# Patient Record
Sex: Female | Born: 1960 | Race: White | Hispanic: No | Marital: Married | Smoking: Former smoker
Health system: Southern US, Community
[De-identification: ages and names within clinical notes are randomized; demographics above are authoritative.]

## PROBLEM LIST (undated history)

## (undated) DIAGNOSIS — F319 Bipolar disorder, unspecified: Secondary | ICD-10-CM

## (undated) DIAGNOSIS — J302 Other seasonal allergic rhinitis: Secondary | ICD-10-CM

## (undated) DIAGNOSIS — R7303 Prediabetes: Secondary | ICD-10-CM

## (undated) DIAGNOSIS — I639 Cerebral infarction, unspecified: Secondary | ICD-10-CM

## (undated) DIAGNOSIS — E559 Vitamin D deficiency, unspecified: Secondary | ICD-10-CM

## (undated) DIAGNOSIS — J45909 Unspecified asthma, uncomplicated: Secondary | ICD-10-CM

## (undated) HISTORY — PX: ABDOMINAL HYSTERECTOMY: SHX81

## (undated) HISTORY — PX: BLADDER SUSPENSION: SHX72

---

## 2006-01-11 ENCOUNTER — Encounter: Admission: RE | Admit: 2006-01-11 | Discharge: 2006-01-11 | Payer: Self-pay | Admitting: Family Medicine

## 2008-11-19 ENCOUNTER — Encounter: Admission: RE | Admit: 2008-11-19 | Discharge: 2008-12-21 | Payer: Self-pay | Admitting: Family Medicine

## 2009-04-30 ENCOUNTER — Ambulatory Visit: Payer: Self-pay | Admitting: Diagnostic Radiology

## 2009-04-30 ENCOUNTER — Emergency Department (HOSPITAL_BASED_OUTPATIENT_CLINIC_OR_DEPARTMENT_OTHER): Admission: EM | Admit: 2009-04-30 | Discharge: 2009-05-01 | Payer: Self-pay | Admitting: Emergency Medicine

## 2009-05-01 ENCOUNTER — Ambulatory Visit: Payer: Self-pay | Admitting: Diagnostic Radiology

## 2010-06-19 LAB — POCT CARDIAC MARKERS
CKMB, poc: <1 ng/mL — ABNORMAL LOW (ref 1.0–8.0)
CKMB, poc: <1 ng/mL — ABNORMAL LOW (ref 1.0–8.0)
Myoglobin, poc: 26.7 ng/mL (ref 12–200)
Troponin i, poc: <0.05 ng/mL (ref 0.00–0.09)

## 2010-06-19 LAB — BASIC METABOLIC PANEL
BUN: 22 mg/dL (ref 6–23)
Chloride: 105 mEq/L (ref 96–112)
Creatinine, Ser: 0.8 mg/dL (ref 0.4–1.2)
Glucose, Bld: 95 mg/dL (ref 70–99)

## 2010-06-19 LAB — CBC
MCHC: 34.3 g/dL (ref 30.0–36.0)
MCV: 93.1 fL (ref 78.0–100.0)
Platelets: 257 10*3/uL (ref 150–400)
RDW: 12.6 % (ref 11.5–15.5)
WBC: 11.9 10*3/uL — ABNORMAL HIGH (ref 4.0–10.5)

## 2010-06-19 LAB — D-DIMER, QUANTITATIVE: D-Dimer, Quant: 0.49 ug/mL-FEU — ABNORMAL HIGH (ref 0.00–0.48)

## 2012-12-09 ENCOUNTER — Encounter: Payer: BC Managed Care – PPO | Attending: Family Medicine | Admitting: Dietician

## 2012-12-09 VITALS — Ht 64.0 in | Wt 183.9 lb

## 2012-12-09 DIAGNOSIS — E669 Obesity, unspecified: Secondary | ICD-10-CM

## 2012-12-09 DIAGNOSIS — Z713 Dietary counseling and surveillance: Secondary | ICD-10-CM | POA: Insufficient documentation

## 2012-12-09 NOTE — Progress Notes (Signed)
   Medical Nutrition Therapy:  Appt start time: 0900 end time:  1000.   Assessment:  Primary concerns today: Jenna Delgado is here today for weight loss. She was diagnosed with diabetes 23 years ago and has managed it with diet. Last HgBA1C a couple of weeks ago was 5.9%, per patient. Jenna Delgado would like to lose 50 pounds in order to help continue to manage her diabetes. States she was on steroids for asthma years ago which caused weight gain that she hasn't been able to lose weight and also reports that she gained 10 pounds in the past year.   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 Jenna Delgado a lot for work including internationally. States that she shares the grocery shopping and food preparation with her husband when she is home.   MEDICATIONS: see list   DIETARY INTAKE:  Avoided foods include most vegetables  24-hr recall:  B ( AM): skip 3-4 x week, OR  eggs, toast, and bacon OR protein fiber bar water with whole milk Snk ( AM): none  L ( PM): will skip if ate breakfast, or will go out for [place] or gyros 3 x week with water Snk ( PM): cheese, usually nothing, on weekends chips and dip a couple times per month D ( PM): hamburger with cheese, steak, pork chops, with salad, corn, asparagus, tomatoes Or homemade pizza OR pasta (rarely) with water sometimes wines or beer 2 x week  Snk ( PM): none. Ice cream 2 x month Beverages: water or diet soda   Usual physical activity: goes to the gym 3-4 week usually doing strength training or classes in pool for about 1. 5 hours (cardio and strength training)   Estimated energy needs: 1800 calories 200 g carbohydrates 135 g protein 50 g fat  Progress Towards Goal(s):  In progress.   Nutritional Diagnosis:  NB-1.1 Food and nutrition-related knowledge deficit As related to history of meal skipping, frequent restaurant meals, and large portions .  As evidenced by BMI of 31.6.    Intervention:  Nutrition counseling provided.  Recommending spreading out carbs and calories throughout the day instead of having larger amounts at meal times. Discussed having snacks with protein and carbs and the importance of not skipping meals. Recommended using Calorie Brooke Dare to help track carbs in foods and monitor portion sizes using the "plate method".   Handouts given during visit include:  MyPlate Handout  16X CHO Snacks  Yellow Card for CHO Counting  Monitoring/Evaluation:  Dietary intake, exercise, and body weight in 2 month(s).

## 2012-12-09 NOTE — Patient Instructions (Addendum)
Check out CalorieKing App to check carbs in restaurant and home foods. Don't skip meals.  Aim to have 2-3 carb choices per meal (30-45g CHO).  Have snacks with carbs and protein (15g). Add as many vegetables to plate as possible (goal of 1/2 of your plate). Continue exercising at least 30 minute 5 x week.

## 2012-12-17 ENCOUNTER — Encounter (HOSPITAL_BASED_OUTPATIENT_CLINIC_OR_DEPARTMENT_OTHER): Payer: Self-pay | Admitting: Emergency Medicine

## 2012-12-17 ENCOUNTER — Emergency Department (HOSPITAL_BASED_OUTPATIENT_CLINIC_OR_DEPARTMENT_OTHER): Payer: BC Managed Care – PPO

## 2012-12-17 ENCOUNTER — Emergency Department (HOSPITAL_BASED_OUTPATIENT_CLINIC_OR_DEPARTMENT_OTHER)
Admission: EM | Admit: 2012-12-17 | Discharge: 2012-12-17 | Disposition: A | Discharge status: Home / Self Care | Payer: BC Managed Care – PPO | Attending: Emergency Medicine | Admitting: Emergency Medicine

## 2012-12-17 DIAGNOSIS — R072 Precordial pain: Secondary | ICD-10-CM | POA: Insufficient documentation

## 2012-12-17 DIAGNOSIS — R079 Chest pain, unspecified: Secondary | ICD-10-CM

## 2012-12-17 DIAGNOSIS — R0602 Shortness of breath: Secondary | ICD-10-CM | POA: Insufficient documentation

## 2012-12-17 DIAGNOSIS — J45909 Unspecified asthma, uncomplicated: Secondary | ICD-10-CM | POA: Insufficient documentation

## 2012-12-17 DIAGNOSIS — Z79899 Other long term (current) drug therapy: Secondary | ICD-10-CM | POA: Insufficient documentation

## 2012-12-17 DIAGNOSIS — R6884 Jaw pain: Secondary | ICD-10-CM | POA: Insufficient documentation

## 2012-12-17 HISTORY — DX: Other seasonal allergic rhinitis: J30.2

## 2012-12-17 HISTORY — DX: Prediabetes: R73.03

## 2012-12-17 HISTORY — DX: Unspecified asthma, uncomplicated: J45.909

## 2012-12-17 LAB — BASIC METABOLIC PANEL
CO2: 25 mEq/L (ref 19–32)
Glucose, Bld: 107 mg/dL — ABNORMAL HIGH (ref 70–99)
Potassium: 4.1 mEq/L (ref 3.5–5.1)
Sodium: 141 mEq/L (ref 135–145)

## 2012-12-17 LAB — CBC
Hemoglobin: 13.4 g/dL (ref 12.0–15.0)
Platelets: 194 10*3/uL (ref 150–400)
RBC: 4.28 MIL/uL (ref 3.87–5.11)
WBC: 5 10*3/uL (ref 4.0–10.5)

## 2012-12-17 LAB — TROPONIN I
Troponin I: <0.3 ng/mL (ref <0.30)
Troponin I: <0.3 ng/mL (ref <0.30)

## 2012-12-17 MED ORDER — OMEPRAZOLE 20 MG PO CPDR: 20.0000 mg | DELAYED_RELEASE_CAPSULE | Freq: Every day | ORAL | Status: DC

## 2012-12-17 MED ORDER — PANTOPRAZOLE SODIUM 40 MG IV SOLR
40.0000 mg | Freq: Once | INTRAVENOUS | Status: AC
  Administered 2012-12-17: 40 mg via INTRAVENOUS
  Filled 2012-12-17: qty 40

## 2012-12-17 NOTE — ED Provider Notes (Signed)
CSN: [id number]     Arrival date & time 12/17/12  1109 History   First MD Initiated Contact with Patient 12/17/12 1152     Chief Complaint  Patient presents with  . Chest Pain   (Consider location/radiation/quality/duration/timing/severity/associated sxs/prior Treatment) Patient is a 52 y.o. female presenting with chest pain. The history is provided by the patient. No language interpreter was used.  Chest Pain Pain location:  Substernal area Associated symptoms: shortness of breath   Associated symptoms: no abdominal pain, no cough, no fever, no nausea and no weakness   Associated symptoms comment:  Sharp chest pain that began while sitting up doing work on a computer. It intensified over a short period of time and spread bilaterally around to back. She also reports aching of her left jaw. She states she had a cardiac workup in [place] years ago but is only able to tell that she did not have any arterial interventions, catheterizations or procedures, but also that she has not had to see a cardiologist since. No nausea. She experienced SOB along with the pain. No recent cough or fever.   Past Medical History  Diagnosis Date  . Asthma   . Prediabetes   . Seasonal allergic rhinitis    Past Surgical History  Procedure Laterality Date  . Abdominal hysterectomy     History reviewed. No pertinent family history. History  Substance Use Topics  . Smoking status: Never Smoker   . Smokeless tobacco: Never Used  . Alcohol Use: Yes     Comment: occasionally   OB History   Grav Para Term Preterm Abortions TAB SAB Ect Mult Living                 Review of Systems  Constitutional: Negative for fever and chills.  HENT: Negative.  Negative for neck pain.        C/O jaw aching.  Respiratory: Positive for shortness of breath. Negative for cough.   Cardiovascular: Positive for chest pain.  Gastrointestinal: Negative.  Negative for nausea and abdominal pain.  Musculoskeletal: Negative.    Neurological: Negative.  Negative for weakness.    Allergies  Imitrex; Tetracyclines & related; and Theophyllines  Home Medications   Current Outpatient Rx  Name  Route  Sig  Dispense  Refill  . CALCIUM PO   Oral   Take by mouth.         . Cholecalciferol (VITAMIN D-3) 5000 UNITS TABS   Oral   Take by mouth.         . citalopram (CELEXA) 10 MG tablet   Oral   Take 10 mg by mouth daily.         [name] doxepin (SINEQUAN) 10 MG capsule   Oral   Take 10 mg by mouth.         . Multiple Vitamin (MULTIVITAMIN WITH MINERALS) TABS tablet   Oral   Take 1 tablet by mouth daily.         . pirbuterol (MAXAIR) 200 MCG/INH inhaler   Inhalation   Inhale 2 puffs into the lungs 4 (four) times daily.          BP 117/68  Pulse 69  Temp(Src) 97.8 F (36.6 C) (Oral)  Resp 18  SpO2 96% Physical Exam  Constitutional: She is oriented to person, place, and time. She appears well-developed and well-nourished. No distress.  HENT:  Head: Normocephalic.  Neck: Normal range of motion. Neck supple.  Cardiovascular: Normal rate and regular rhythm.  No murmur heard. Pulmonary/Chest: Effort normal and breath sounds normal.  Abdominal: Soft. Bowel sounds are normal. There is no tenderness. There is no rebound and no guarding.  Musculoskeletal: Normal range of motion. She exhibits no edema.  Neurological: She is alert and oriented to person, place, and time.  Skin: Skin is warm and dry. No rash noted.  Psychiatric: She has a normal mood and affect.    ED Course  Procedures (including critical care time) Labs Review Labs Reviewed  CBC  BASIC METABOLIC PANEL  TROPONIN I   Date: 12/17/2012  Rate: 63  Rhythm: normal sinus rhythm  QRS Axis: normal  Intervals: normal  ST/T Wave abnormalities: normal  Conduction Disutrbances:none  Narrative Interpretation:   Old EKG Reviewed: none available  Results for orders placed during the hospital encounter of 12/17/12  CBC      Result  Value Range   WBC 5.0  4.0 - 10.5 K/uL   RBC 4.28  3.87 - 5.11 MIL/uL   Hemoglobin 13.4  12.0 - 15.0 g/dL   HCT 91.4  78.2 - 95.6 %   MCV 93.2  78.0 - 100.0 fL   MCH 31.3  26.0 - 34.0 pg   MCHC 33.6  30.0 - 36.0 g/dL   RDW 21.3  08.6 - 57.8 %   Platelets 194  150 - 400 K/uL  BASIC METABOLIC PANEL      Result Value Range   Sodium 141  135 - 145 mEq/L   Potassium 4.1  3.5 - 5.1 mEq/L   Chloride 105  96 - 112 mEq/L   CO2 25  19 - 32 mEq/L   Glucose, Bld 107 (*) 70 - 99 mg/dL   BUN 19  6 - 23 mg/dL   Creatinine, Ser 4.69  0.50 - 1.10 mg/dL   Calcium 62.9  8.4 - 52.8 mg/dL   GFR calc non Af Amer >90  >90 mL/min   GFR calc Af Amer >90  >90 mL/min  TROPONIN I      Result Value Range   Troponin I <0.30  <0.30 ng/mL  TROPONIN I      Result Value Range   Troponin I <0.30  <0.30 ng/mL   Dg Chest Portable 1 View  12/17/2012   CLINICAL DATA:  Chest pain  EXAM: PORTABLE CHEST - 1 VIEW  COMPARISON:  08/13/2012  FINDINGS: Hypoventilation with decreased lung volumes and bibasilar atelectasis. Negative for heart failure or effusion.  IMPRESSION: Hypoventilation with bibasilar atelectasis.   Electronically Signed   By: Marlan Palau M.D.   On: 12/17/2012 12:24   Imaging Review No results found.  MDM  No diagnosis found. 1. Chest pain  Patient having atypical chest pain wrapping around to back that decreased spontaneously over time and has remained asymptomatic while here without intervention. Enzymes x 2 negative, normal sinus EKG. Stable for discharge.     Arnoldo Hooker, PA-C 12/17/12 1551

## 2012-12-17 NOTE — ED Notes (Signed)
Pain started while sitting in the computer. Also reports dizziness. Pain was generalized on the chest and radiating to the back, law and neck. Relieved with NTG given by EMS. Currently pt c/o "back pain mainly, the chest feel a bit better"

## 2012-12-18 NOTE — ED Provider Notes (Signed)
Medical screening examination/treatment/procedure(s) were performed by non-physician practitioner and as supervising physician I was immediately available for consultation/collaboration.   Gwyneth Sprout, MD 12/18/12 [id number]

## 2013-02-09 ENCOUNTER — Ambulatory Visit: Payer: BC Managed Care – PPO | Admitting: Dietician

## 2013-07-13 ENCOUNTER — Emergency Department (HOSPITAL_COMMUNITY)
Admission: EM | Admit: 2013-07-13 | Discharge: 2013-07-13 | Disposition: A | Discharge status: Home / Self Care | Payer: BC Managed Care – PPO | Attending: Emergency Medicine | Admitting: Emergency Medicine

## 2013-07-13 ENCOUNTER — Encounter (HOSPITAL_COMMUNITY): Payer: Self-pay | Admitting: Emergency Medicine

## 2013-07-13 ENCOUNTER — Emergency Department (HOSPITAL_COMMUNITY): Payer: BC Managed Care – PPO

## 2013-07-13 DIAGNOSIS — Z79899 Other long term (current) drug therapy: Secondary | ICD-10-CM | POA: Insufficient documentation

## 2013-07-13 DIAGNOSIS — H00019 Hordeolum externum unspecified eye, unspecified eyelid: Secondary | ICD-10-CM

## 2013-07-13 DIAGNOSIS — J45909 Unspecified asthma, uncomplicated: Secondary | ICD-10-CM | POA: Insufficient documentation

## 2013-07-13 DIAGNOSIS — H05019 Cellulitis of unspecified orbit: Secondary | ICD-10-CM | POA: Insufficient documentation

## 2013-07-13 DIAGNOSIS — Z792 Long term (current) use of antibiotics: Secondary | ICD-10-CM | POA: Insufficient documentation

## 2013-07-13 DIAGNOSIS — L03213 Periorbital cellulitis: Secondary | ICD-10-CM

## 2013-07-13 LAB — COMPREHENSIVE METABOLIC PANEL
ALK PHOS: 83 U/L (ref 39–117)
ALT: 28 U/L (ref 0–35)
AST: 19 U/L (ref 0–37)
Albumin: 3.9 g/dL (ref 3.5–5.2)
BILIRUBIN TOTAL: 0.7 mg/dL (ref 0.3–1.2)
BUN: 14 mg/dL (ref 6–23)
CHLORIDE: 101 meq/L (ref 96–112)
CO2: 27 meq/L (ref 19–32)
Calcium: 9.5 mg/dL (ref 8.4–10.5)
Creatinine, Ser: 0.78 mg/dL (ref 0.50–1.10)
Glucose, Bld: 188 mg/dL — ABNORMAL HIGH (ref 70–99)
POTASSIUM: 4.1 meq/L (ref 3.7–5.3)
Sodium: 141 mEq/L (ref 137–147)
Total Protein: 7.2 g/dL (ref 6.0–8.3)

## 2013-07-13 LAB — CBC WITH DIFFERENTIAL/PLATELET
Basophils Absolute: 0.1 10*3/uL (ref 0.0–0.1)
Basophils Relative: 1 % (ref 0–1)
EOS ABS: 0.4 10*3/uL (ref 0.0–0.7)
EOS PCT: 6 % — AB (ref 0–5)
HCT: 40.2 % (ref 36.0–46.0)
Hemoglobin: 13.3 g/dL (ref 12.0–15.0)
LYMPHS ABS: 1.7 10*3/uL (ref 0.7–4.0)
Lymphocytes Relative: 22 % (ref 12–46)
MCH: 30.6 pg (ref 26.0–34.0)
MCHC: 33.1 g/dL (ref 30.0–36.0)
MCV: 92.6 fL (ref 78.0–100.0)
Monocytes Absolute: 0.4 10*3/uL (ref 0.1–1.0)
Monocytes Relative: 5 % (ref 3–12)
Neutro Abs: 5.3 10*3/uL (ref 1.7–7.7)
Neutrophils Relative %: 67 % (ref 43–77)
PLATELETS: 235 10*3/uL (ref 150–400)
RBC: 4.34 MIL/uL (ref 3.87–5.11)
RDW: 13.9 % (ref 11.5–15.5)
WBC: 7.8 10*3/uL (ref 4.0–10.5)

## 2013-07-13 MED ORDER — CEPHALEXIN 500 MG PO CAPS: 500.0000 mg | ORAL_CAPSULE | Freq: Four times a day (QID) | ORAL | Status: DC

## 2013-07-13 MED ORDER — METHYLPREDNISOLONE SODIUM SUCC 125 MG IJ SOLR: 125.0000 mg | Freq: Once | INTRAMUSCULAR | Status: DC

## 2013-07-13 MED ORDER — OXYCODONE-ACETAMINOPHEN 5-325 MG PO TABS: 1.0000 | ORAL_TABLET | Freq: Four times a day (QID) | ORAL | Status: DC | PRN

## 2013-07-13 MED ORDER — DEXTROSE 5 % IV SOLN
1.0000 g | Freq: Once | INTRAVENOUS | Status: AC
  Administered 2013-07-13: 1 g via INTRAVENOUS
  Filled 2013-07-13: qty 10

## 2013-07-13 MED ORDER — IOHEXOL 300 MG/ML  SOLN
80.0000 mL | Freq: Once | INTRAMUSCULAR | Status: AC | PRN
  Administered 2013-07-13: 80 mL via INTRAVENOUS

## 2013-07-13 MED ORDER — MORPHINE SULFATE 4 MG/ML IJ SOLN
4.0000 mg | Freq: Once | INTRAMUSCULAR | Status: AC
  Administered 2013-07-13: 4 mg via INTRAVENOUS
  Filled 2013-07-13: qty 1

## 2013-07-13 NOTE — Discharge Instructions (Signed)
Facial Infection You have an infection of your face. This requires special attention to help prevent serious problems. Infections in facial wounds can cause poor healing and scars. They can also spread to deeper tissues, especially around the eye. Wound and dental infections can lead to sinusitis, infection of the eye socket, and even meningitis. Permanent damage to the skin, eye, and nervous system may result if facial infections are not treated properly. With severe infections, hospital care for IV antibiotic injections may be needed if they don't respond to oral antibiotics. Antibiotics must be taken for the full course to insure the infection is eliminated. If the infection came from a bad tooth, it may have to be extracted when the infection is under control. Warm compresses may be applied to reduce skin irritation and remove drainage. You might need a tetanus shot now if:  You cannot remember when your last tetanus shot was.  You have never had a tetanus shot.  The object that caused your wound was dirty. If you need a tetanus shot, and you decide not to get one, there is a rare chance of getting tetanus. Sickness from tetanus can be serious. If you got a tetanus shot, your arm may swell, get red and warm to the touch at the shot site. This is common and not a problem. SEEK IMMEDIATE MEDICAL CARE IF:   You have increased swelling, redness, or trouble breathing.  You have a severe headache, dizziness, nausea, or vomiting.  You develop problems with your eyesight.  You have a fever. Document Released: 04/26/2004 Document Revised: 06/11/2011 Document Reviewed: 03/19/2005 [facility] Patient Information 2014 [place], [place].  Sty A sty (hordeolum) is an infection of a gland in the eyelid located at the base of the eyelash. A sty may develop a white or yellow head of pus. It can be puffy (swollen). Usually, the sty will burst and pus will come out on its own. They do not leave lumps in the  eyelid once they drain. A sty is often confused with another form of cyst of the eyelid called a chalazion. Chalazions occur within the eyelid and not on the edge where the bases of the eyelashes are. They often are red, sore and then form firm lumps in the eyelid. CAUSES   Germs (bacteria).  Lasting (chronic) eyelid inflammation. SYMPTOMS   Tenderness, redness and swelling along the edge of the eyelid at the base of the eyelashes.  Sometimes, there is a white or yellow head of pus. It may or may not drain. DIAGNOSIS  An ophthalmologist will be able to distinguish between a sty and a chalazion and treat the condition appropriately.  TREATMENT   Styes are typically treated with warm packs (compresses) until drainage occurs.  In rare cases, medicines that kill germs (antibiotics) may be prescribed. These antibiotics may be in the form of drops, cream or pills.  If a hard lump has formed, it is generally necessary to do a small incision and remove the hardened contents of the cyst in a minor surgical procedure done in the office.  In suspicious cases, your caregiver may send the contents of the cyst to the lab to be certain that it is not a rare, but dangerous form of cancer of the glands of the eyelid. HOME CARE INSTRUCTIONS   Wash your hands often and dry them with a clean towel. Avoid touching your eyelid. This may spread the infection to other parts of the eye.  Apply heat to your eyelid for  10 to 20 minutes, several times a day, to ease pain and help to heal it faster.  Do not squeeze the sty. Allow it to drain on its own. Wash your eyelid carefully 3 to 4 times per day to remove any pus. SEEK IMMEDIATE MEDICAL CARE IF:   Your eye becomes painful or puffy (swollen).  Your vision changes.  Your sty does not drain by itself within 3 days.  Your sty comes back within a short period of time, even with treatment.  You have redness (inflammation) around the eye.  You have a  fever. Document Released: 12/27/2004 Document Revised: 06/11/2011 Document Reviewed: 08/31/2008 [facility] Patient Information 2014 [place], [place]LL[place].

## 2013-07-13 NOTE — ED Notes (Signed)
Unable to obtain blood from patient at this time.Patient did not answer when called for lab draw.RN made aware

## 2013-07-13 NOTE — ED Provider Notes (Signed)
CSN: [id number]     Arrival date & time 07/13/13  1337 History   First MD Initiated Contact with Patient 07/13/13 1659     Chief Complaint  Patient presents with  . Infection under eye   . from pcp      (Consider location/radiation/quality/duration/timing/severity/associated sxs/prior Treatment) The history is provided by the patient.   patient's had left eye pain and swelling for the last 10 days. She saw her ophthalmologist today and was sent in to rule out post-septal cellulitis. She has a pain with light of the pain with movement of the eye. There is swelling below the left eye. She states she's been having some chills. No nausea vomiting. No headache.  Past Medical History  Diagnosis Date  . Asthma   . Prediabetes   . Seasonal allergic rhinitis    Past Surgical History  Procedure Laterality Date  . Abdominal hysterectomy     History reviewed. No pertinent family history. History  Substance Use Topics  . Smoking status: Never Smoker   . Smokeless tobacco: Never Used  . Alcohol Use: Yes     Comment: occasionally   OB History   Grav Para Term Preterm Abortions TAB SAB Ect Mult Living                 Review of Systems  Constitutional: Positive for chills. Negative for appetite change.  HENT: Negative for ear discharge, ear pain and tinnitus.   Eyes: Positive for photophobia and pain. Negative for discharge and redness.  Respiratory: Negative for shortness of breath.   Cardiovascular: Negative for chest pain.  Musculoskeletal: Negative for joint swelling.  Skin: Positive for color change.      Allergies  Imitrex; Tetracyclines & related; and Theophyllines  Home Medications   Current Outpatient Rx  Name  Route  Sig  Dispense  Refill  . citalopram (CELEXA) 10 MG tablet   Oral   Take 10 mg by mouth daily.         . Cranberry 300 MG tablet   Oral   Take 300 mg by mouth 2 (two) times daily.         . fexofenadine (ALLEGRA) 180 MG tablet   Oral   Take  180 mg by mouth daily.         [name]. ibuprofen (ADVIL,MOTRIN) 200 MG tablet   Oral   Take 600 mg by mouth every 6 (six) hours as needed for moderate pain.         [name]. lamoTRIgine (LAMICTAL) 25 MG tablet   Oral   Take 37.5 mg by mouth daily.         . cephALEXin (KEFLEX) 500 MG capsule   Oral   Take 1 capsule (500 mg total) by mouth 4 (four) times daily.   20 capsule   0   . oxyCODONE-acetaminophen (PERCOCET/ROXICET) 5-325 MG per tablet   Oral   Take 1-2 tablets by mouth every 6 (six) hours as needed for severe pain.   10 tablet   0    BP 130/78  Pulse 70  Temp(Src) 98 F (36.7 C) (Oral)  Resp 18  SpO2 98% Physical Exam  Constitutional: She appears well-developed and well-nourished.  Eyes: Pupils are equal, round, and reactive to light.  Extraocular movements are intact, however there is some pain with movement of the left eye. No proptosis. There is swelling over the medial left lower eyelid with redness. Is no fluctuance there is some edema below it on the  face.  Neck: Neck supple.  Cardiovascular: Normal rate and regular rhythm.   Pulmonary/Chest: Effort normal and breath sounds normal.  Abdominal: Soft.  Musculoskeletal: She exhibits no edema.  Neurological: She is alert.    ED Course  Procedures (including critical care time) Labs Review Labs Reviewed  CBC WITH DIFFERENTIAL - Abnormal; Notable for the following:    Eosinophils Relative 6 (*)    All other components within normal limits  COMPREHENSIVE METABOLIC PANEL - Abnormal; Notable for the following:    Glucose, Bld 188 (*)    All other components within normal limits   Imaging Review Ct Orbits W/cm  07/13/2013   CLINICAL DATA:  Evaluate left orbital cellulitis  EXAM: CT ORBITS WITH CONTRAST  TECHNIQUE: Multidetector CT imaging of the orbits was performed following the bolus administration of intravenous contrast.  CONTRAST:  80mL OMNIPAQUE IOHEXOL 300 MG/ML  SOLN  COMPARISON:  None.  FINDINGS: Preseptal  soft tissue swelling overlying the inferior left orbit.  Suspected stye medially along the inferior left eyelid (series 3/image 26).  Underlying globe and retroconal soft tissues are within normal limits.  Right orbit is within normal limits.  Visualized brain parenchyma is unremarkable.  Partial opacification of the bilateral ethmoid and maxillary sinuses. Bilateral mastoid air cells are clear.  IMPRESSION: Left preseptal orbital cellulitis, as above.  Suspected stye medially along the inferior left eyelid.  Paranasal sinus disease, as above.   Electronically Signed   By: Charline BillsSriyesh  Krishnan M.D.   On: 07/13/2013 18:27     EKG Interpretation None      MDM   Final diagnoses:  Periorbital cellulitis of left eye  Stye    Patient with    left periorbital cellulitis with his diet. not amenable to draining. We will give 1 dose of IV antibiotics and follow with oral and bodies. Patient will follow with her ophthalmologist.   Juliet RudeNathan R. Rubin PayorPickering, MD 07/13/13 1935

## 2013-07-13 NOTE — ED Notes (Signed)
Patient states that she developed swelling under left eye that started several days ago.  Went to her opthomologist this morning who sent her here for an evaluation.

## 2013-08-04 ENCOUNTER — Emergency Department (HOSPITAL_BASED_OUTPATIENT_CLINIC_OR_DEPARTMENT_OTHER)
Admission: EM | Admit: 2013-08-04 | Discharge: 2013-08-04 | Disposition: A | Discharge status: Home / Self Care | Payer: BC Managed Care – PPO

## 2013-08-04 ENCOUNTER — Ambulatory Visit (HOSPITAL_BASED_OUTPATIENT_CLINIC_OR_DEPARTMENT_OTHER)
Admission: RE | Admit: 2013-08-04 | Discharge: 2013-08-04 | Disposition: A | Discharge status: Home / Self Care | Payer: BC Managed Care – PPO | Source: Ambulatory Visit | Attending: Ophthalmology | Admitting: Ophthalmology

## 2013-08-04 ENCOUNTER — Other Ambulatory Visit (HOSPITAL_BASED_OUTPATIENT_CLINIC_OR_DEPARTMENT_OTHER): Payer: Self-pay | Admitting: Ophthalmology

## 2013-08-04 DIAGNOSIS — H5710 Ocular pain, unspecified eye: Secondary | ICD-10-CM | POA: Insufficient documentation

## 2013-08-04 DIAGNOSIS — H00039 Abscess of eyelid unspecified eye, unspecified eyelid: Secondary | ICD-10-CM

## 2013-08-04 MED ORDER — GADOBENATE DIMEGLUMINE 529 MG/ML IV SOLN
15.0000 mL | Freq: Once | INTRAVENOUS | Status: AC | PRN
  Administered 2013-08-04: 15 mL via INTRAVENOUS

## 2013-08-04 NOTE — ED Notes (Signed)
Pt was not seen in the ED today. Pt not triaged. Registered in error.

## 2014-04-24 ENCOUNTER — Encounter (HOSPITAL_BASED_OUTPATIENT_CLINIC_OR_DEPARTMENT_OTHER): Payer: Self-pay | Admitting: *Deleted

## 2014-04-24 ENCOUNTER — Emergency Department (HOSPITAL_BASED_OUTPATIENT_CLINIC_OR_DEPARTMENT_OTHER): Payer: BLUE CROSS/BLUE SHIELD

## 2014-04-24 ENCOUNTER — Emergency Department (HOSPITAL_BASED_OUTPATIENT_CLINIC_OR_DEPARTMENT_OTHER)
Admission: EM | Admit: 2014-04-24 | Discharge: 2014-04-24 | Disposition: A | Discharge status: Home / Self Care | Payer: BLUE CROSS/BLUE SHIELD | Attending: Emergency Medicine | Admitting: Emergency Medicine

## 2014-04-24 DIAGNOSIS — Z792 Long term (current) use of antibiotics: Secondary | ICD-10-CM | POA: Insufficient documentation

## 2014-04-24 DIAGNOSIS — J45909 Unspecified asthma, uncomplicated: Secondary | ICD-10-CM | POA: Diagnosis not present

## 2014-04-24 DIAGNOSIS — R200 Anesthesia of skin: Secondary | ICD-10-CM | POA: Diagnosis present

## 2014-04-24 DIAGNOSIS — Z79899 Other long term (current) drug therapy: Secondary | ICD-10-CM | POA: Diagnosis not present

## 2014-04-24 LAB — URINALYSIS, ROUTINE W REFLEX MICROSCOPIC
Bilirubin Urine: NEGATIVE
Glucose, UA: NEGATIVE mg/dL
Hgb urine dipstick: NEGATIVE
Ketones, ur: NEGATIVE mg/dL
Leukocytes, UA: NEGATIVE
NITRITE: NEGATIVE
PROTEIN: NEGATIVE mg/dL
SPECIFIC GRAVITY, URINE: 1.019 (ref 1.005–1.030)
Urobilinogen, UA: 0.2 mg/dL (ref 0.0–1.0)
pH: 5 (ref 5.0–8.0)

## 2014-04-24 LAB — CBC WITH DIFFERENTIAL/PLATELET
BASOS ABS: 0.1 10*3/uL (ref 0.0–0.1)
Basophils Relative: 1 % (ref 0–1)
EOS ABS: 0.4 10*3/uL (ref 0.0–0.7)
Eosinophils Relative: 7 % — ABNORMAL HIGH (ref 0–5)
HCT: 41.9 % (ref 36.0–46.0)
HEMOGLOBIN: 13.9 g/dL (ref 12.0–15.0)
LYMPHS ABS: 1.8 10*3/uL (ref 0.7–4.0)
Lymphocytes Relative: 27 % (ref 12–46)
MCH: 30.9 pg (ref 26.0–34.0)
MCHC: 33.2 g/dL (ref 30.0–36.0)
MCV: 93.1 fL (ref 78.0–100.0)
MONO ABS: 0.4 10*3/uL (ref 0.1–1.0)
Monocytes Relative: 6 % (ref 3–12)
Neutro Abs: 3.9 10*3/uL (ref 1.7–7.7)
Neutrophils Relative %: 59 % (ref 43–77)
Platelets: 227 10*3/uL (ref 150–400)
RBC: 4.5 MIL/uL (ref 3.87–5.11)
RDW: 14 % (ref 11.5–15.5)
WBC: 6.7 10*3/uL (ref 4.0–10.5)

## 2014-04-24 LAB — BASIC METABOLIC PANEL
Anion gap: 4 — ABNORMAL LOW (ref 5–15)
BUN: 20 mg/dL (ref 6–23)
CO2: 25 mmol/L (ref 19–32)
Calcium: 9 mg/dL (ref 8.4–10.5)
Chloride: 108 mmol/L (ref 96–112)
Creatinine, Ser: 0.76 mg/dL (ref 0.50–1.10)
GFR calc Af Amer: >90 mL/min (ref >90)
Glucose, Bld: 147 mg/dL — ABNORMAL HIGH (ref 70–99)
POTASSIUM: 3.9 mmol/L (ref 3.5–5.1)
Sodium: 137 mmol/L (ref 135–145)

## 2014-04-24 MED ORDER — SODIUM CHLORIDE 0.9 % IV BOLUS (SEPSIS)
500.0000 mL | Freq: Once | INTRAVENOUS | Status: AC
  Administered 2014-04-24: 500 mL via INTRAVENOUS

## 2014-04-24 NOTE — Discharge Instructions (Signed)
Start taking a full-strength 325 mg aspirin once daily until you follow up with your primary doctor.  [facility] if your symptoms substantially worsen or change.   Paresthesia Paresthesia is an abnormal burning or prickling sensation. This sensation is generally felt in the hands, arms, legs, or feet. However, it may occur in any part of the body. It is usually not painful. The feeling may be described as:  Tingling or numbness.  "Pins and needles."  Skin crawling.  Buzzing.  Limbs "falling asleep."  Itching. Most people experience temporary (transient) paresthesia at some time in their lives. CAUSES  Paresthesia may occur when you breathe too quickly (hyperventilation). It can also occur without any apparent cause. Commonly, paresthesia occurs when pressure is placed on a nerve. The feeling quickly goes away once the pressure is removed. For some people, however, paresthesia is a long-lasting (chronic) condition caused by an underlying disorder. The underlying disorder may be:  A traumatic, direct injury to nerves. Examples include a:  Broken (fractured) neck.  Fractured skull.  A disorder affecting the brain and spinal cord (central nervous system). Examples include:  Transverse myelitis.  Encephalitis.  Transient ischemic attack.  Multiple sclerosis.  Stroke.  Tumor or blood vessel problems, such as an arteriovenous malformation pressing against the brain or spinal cord.  A condition that damages the peripheral nerves (peripheral neuropathy). Peripheral nerves are not part of the brain and spinal cord. These conditions include:  Diabetes.  Peripheral vascular disease.  Nerve entrapment syndromes, such as carpal tunnel syndrome.  Shingles.  Hypothyroidism.  Vitamin B12 deficiencies.  Alcoholism.  Heavy metal poisoning (lead, arsenic).  Rheumatoid arthritis.  Systemic lupus erythematosus. DIAGNOSIS  Your caregiver will  attempt to find the underlying cause of your paresthesia. Your caregiver may:  Take your medical history.  Perform a physical exam.  Order various lab tests.  Order imaging tests. TREATMENT  Treatment for paresthesia depends on the underlying cause. HOME CARE INSTRUCTIONS  Avoid drinking alcohol.  You may consider massage or acupuncture to help relieve your symptoms.  Keep all follow-up appointments as directed by your caregiver. SEEK IMMEDIATE MEDICAL CARE IF:   You feel weak.  You have trouble walking or moving.  You have problems with speech or vision.  You feel confused.  You cannot control your bladder or bowel movements.  You feel numbness after an injury.  You faint.  Your burning or prickling feeling gets worse when walking.  You have pain, cramps, or dizziness.  You develop a rash. MAKE SURE YOU:  Understand these instructions.  Will watch your condition.  Will get help right away if you are not doing well or get worse. Document Released: 03/09/2002 Document Revised: 06/11/2011 Document Reviewed: 12/08/2010 [facility] Patient Information 2015 [place], [place]. This information is not intended to replace advice given to you by your health care provider. Make sure you discuss any questions you have with your health care provider.

## 2014-04-24 NOTE — ED Notes (Signed)
MD at bedside. 

## 2014-04-24 NOTE — ED Provider Notes (Signed)
CSN: [id number]     Arrival date & time 04/24/14  1349 History   First MD Initiated Contact with Patient 04/24/14 1401     Chief Complaint  Patient presents with  . Numbness     (Consider location/radiation/quality/duration/timing/severity/associated sxs/prior Treatment) HPI 54 y.o. Female with lkn last night 11-1 am.  She went to bed at 11 and to sleep at 1-2.  Awoke this am at 11 and felt face was numb and thinks some asymmetry.  No change in vision, arm/leg weakness, difficulty walking, or speaking.  No similar symptoms in past.   Past Medical History  Diagnosis Date  . Asthma   . Prediabetes   . Seasonal allergic rhinitis    Past Surgical History  Procedure Laterality Date  . Abdominal hysterectomy     No family history on file. History  Substance Use Topics  . Smoking status: Never Smoker   . Smokeless tobacco: Never Used  . Alcohol Use: Yes     Comment: occasionally   OB History    No data available     Review of Systems  All other systems reviewed and are negative.     Allergies  Imitrex; Tetracyclines & related; and Theophyllines  Home Medications   Prior to Admission medications   Medication Sig Start Date End Date Taking? Authorizing Provider  cephALEXin (KEFLEX) 500 MG capsule Take 1 capsule (500 mg total) by mouth 4 (four) times daily. 07/13/13   Juliet Rude. Pickering, MD  citalopram (CELEXA) 10 MG tablet Take 10 mg by mouth daily.    Historical Provider, MD  Cranberry 300 MG tablet Take 300 mg by mouth 2 (two) times daily.    Historical Provider, MD  fexofenadine (ALLEGRA) 180 MG tablet Take 180 mg by mouth daily.    Historical Provider, MD  ibuprofen (ADVIL,MOTRIN) 200 MG tablet Take 600 mg by mouth every 6 (six) hours as needed for moderate pain.    Historical Provider, MD  lamoTRIgine (LAMICTAL) 25 MG tablet Take 37.5 mg by mouth daily.    Historical Provider, MD  oxyCODONE-acetaminophen (PERCOCET/ROXICET) 5-325 MG per tablet Take 1-2 tablets by mouth  every 6 (six) hours as needed for severe pain. 07/13/13   Juliet Rude. Pickering, MD   BP 116/76 mmHg  Pulse 95  Temp(Src) 98.8 F (37.1 C) (Oral)  Resp 18  Ht  (1.626 m)  Wt 172 lb (78.019 kg)  BMI 29.51 kg/m2  SpO2 98% Physical Exam  Constitutional: She is oriented to person, place, and time. She appears well-developed and well-nourished.  HENT:  Head: Normocephalic and atraumatic.  Right Ear: Tympanic membrane and external ear normal.  Left Ear: Tympanic membrane and external ear normal.  Nose: Nose normal. Right sinus exhibits no maxillary sinus tenderness and no frontal sinus tenderness. Left sinus exhibits no maxillary sinus tenderness and no frontal sinus tenderness.  Eyes: Conjunctivae and EOM are normal. Pupils are equal, round, and reactive to light. Right eye exhibits no nystagmus. Left eye exhibits no nystagmus.  Neck: Normal range of motion. Neck supple.  Cardiovascular: Normal rate, regular rhythm, normal heart sounds and intact distal pulses.   Pulmonary/Chest: Effort normal and breath sounds normal. No respiratory distress. She exhibits no tenderness.  Abdominal: Soft. Bowel sounds are normal. She exhibits no distension and no mass. There is no tenderness.  Musculoskeletal: Normal range of motion. She exhibits no edema or tenderness.  Neurological: She is alert and oriented to person, place, and time. She has normal strength and normal  reflexes. She displays normal reflexes. No sensory deficit. She exhibits normal muscle tone. She displays a negative Romberg sign. GCS eye subscore is 4. GCS verbal subscore is 5. GCS motor subscore is 6.  Reflex Scores:      Tricep reflexes are 2+ on the right side and 2+ on the left side.      Bicep reflexes are 2+ on the right side and 2+ on the left side.      Brachioradialis reflexes are 2+ on the right side and 2+ on the left side.      Patellar reflexes are 2+ on the right side and 2+ on the left side.      Achilles reflexes are 2+  on the right side and 2+ on the left side. Patient with normal gait without ataxia, shuffling, spasm, or antalgia. Speech is normal without dysarthria, dysphasia, or aphasia. Muscle strength is 5/5 in bilateral shoulders, elbow flexor and extensors, wrist flexor and extensors, and intrinsic hand muscles. 5/5 bilateral lower extremity hip flexors, extensors, knee flexors and extensors, and ankle dorsi and plantar flexors.  Some decreased light/dull sensatin to right side of face.    Skin: Skin is warm and dry. No rash noted.  Psychiatric: She has a normal mood and affect. Her behavior is normal. Judgment and thought content normal.  Nursing note and vitals reviewed.   ED Course  Procedures (including critical care time) Labs Review Labs Reviewed  CBC WITH DIFFERENTIAL/PLATELET  BASIC METABOLIC PANEL  URINALYSIS, ROUTINE W REFLEX MICROSCOPIC    Imaging Review No results found.   EKG Interpretation   Date/Time:  Saturday April 24 2014 14:28:08 EST Ventricular Rate:  74 PR Interval:  130 QRS Duration: 84 QT Interval:  402 QTC Calculation: 446 R Axis:   15 Text Interpretation:  Normal sinus rhythm Cannot rule out Anterior infarct  , age undetermined Abnormal ECG Confirmed by Merelyn Klump MD, Duwayne HeckANIELLE [phone]([id number]) on  04/24/2014 2:30:14 PM      MDM   Final diagnoses:  Numbness    NIH stroke scale- 1  Patient to have mri done and may be discharged if normal.  Discussed with Dr. Judd Lienelo and he will follow.   Hilario Quarryanielle S Sakai Wolford, MD 04/27/14 [phone]

## 2014-04-24 NOTE — ED Notes (Signed)
Pt reports that her face "feels weird, numb".  Pt is alert and oriented, no focal weakness, no facial droop noted in triage, speech clear.  Pt is ambulatory. She states that she had this when she woke up this am and LSN was before she went to sleep at 2am.

## 2014-04-24 NOTE — ED Provider Notes (Signed)
Care assumed from Dr. Rosalia Hammersay at shift change. Patient presents with complaints of right facial, right arm, and right leg numbness which started this morning when she woke from sleep. She was to bed feeling well. She denies any weakness. She denies any headache. Her care was signed out to me awaiting the results of a CT scan and laboratory studies. The head CT revealed no evidence for stroke and laboratory studies are essentially unremarkable.  She was reexamined and appears to be neurologically intact. I can appreciate no strength, motor, or sensory deficits. It is my assessment that this patient should undergo an MRI to fully rule out a CVA. I advised her of these recommendations, however she is reluctant to have this performed. She states that in the past her insurance company did not cover an MRI and it cost her a significant quantity of money out of pocket. She is requesting to go home and follow-up with her primary doctor. I have advised her that I do not feel as though this is the best way to go about this, however she insists upon being discharged. She understands there is the possibility she may experience a worse event I could leave her with serious disability and possibly death and is willing to assume these risks. She will follow-up with her primary doctor on Monday to discuss her situation.  Jenna Lyonsouglas Jannis Atkins, MD 04/24/14 2148

## 2015-09-25 ENCOUNTER — Encounter (HOSPITAL_COMMUNITY): Payer: Self-pay

## 2015-09-25 ENCOUNTER — Emergency Department (HOSPITAL_COMMUNITY): Payer: BLUE CROSS/BLUE SHIELD

## 2015-09-25 ENCOUNTER — Inpatient Hospital Stay (HOSPITAL_COMMUNITY): Payer: BLUE CROSS/BLUE SHIELD

## 2015-09-25 ENCOUNTER — Inpatient Hospital Stay (HOSPITAL_COMMUNITY)
Admission: EM | Admit: 2015-09-25 | Discharge: 2015-09-27 | DRG: 066 | Disposition: A | Discharge status: Home / Self Care | Payer: BLUE CROSS/BLUE SHIELD | Attending: Internal Medicine | Admitting: Internal Medicine

## 2015-09-25 ENCOUNTER — Other Ambulatory Visit: Payer: Self-pay

## 2015-09-25 DIAGNOSIS — Z6830 Body mass index (BMI) 30.0-30.9, adult: Secondary | ICD-10-CM | POA: Diagnosis not present

## 2015-09-25 DIAGNOSIS — R739 Hyperglycemia, unspecified: Secondary | ICD-10-CM | POA: Diagnosis not present

## 2015-09-25 DIAGNOSIS — R200 Anesthesia of skin: Secondary | ICD-10-CM | POA: Diagnosis present

## 2015-09-25 DIAGNOSIS — J45909 Unspecified asthma, uncomplicated: Secondary | ICD-10-CM | POA: Diagnosis present

## 2015-09-25 DIAGNOSIS — R7303 Prediabetes: Secondary | ICD-10-CM | POA: Diagnosis present

## 2015-09-25 DIAGNOSIS — E669 Obesity, unspecified: Secondary | ICD-10-CM | POA: Diagnosis present

## 2015-09-25 DIAGNOSIS — Z66 Do not resuscitate: Secondary | ICD-10-CM | POA: Diagnosis present

## 2015-09-25 DIAGNOSIS — I6789 Other cerebrovascular disease: Secondary | ICD-10-CM | POA: Diagnosis not present

## 2015-09-25 DIAGNOSIS — E785 Hyperlipidemia, unspecified: Secondary | ICD-10-CM | POA: Diagnosis present

## 2015-09-25 DIAGNOSIS — I6340 Cerebral infarction due to embolism of unspecified cerebral artery: Principal | ICD-10-CM | POA: Diagnosis present

## 2015-09-25 DIAGNOSIS — I639 Cerebral infarction, unspecified: Secondary | ICD-10-CM | POA: Insufficient documentation

## 2015-09-25 DIAGNOSIS — I63411 Cerebral infarction due to embolism of right middle cerebral artery: Secondary | ICD-10-CM | POA: Diagnosis not present

## 2015-09-25 DIAGNOSIS — Z79899 Other long term (current) drug therapy: Secondary | ICD-10-CM

## 2015-09-25 DIAGNOSIS — R7302 Impaired glucose tolerance (oral): Secondary | ICD-10-CM | POA: Diagnosis present

## 2015-09-25 DIAGNOSIS — E782 Mixed hyperlipidemia: Secondary | ICD-10-CM

## 2015-09-25 DIAGNOSIS — F419 Anxiety disorder, unspecified: Secondary | ICD-10-CM | POA: Diagnosis present

## 2015-09-25 DIAGNOSIS — F319 Bipolar disorder, unspecified: Secondary | ICD-10-CM | POA: Diagnosis present

## 2015-09-25 DIAGNOSIS — I63031 Cerebral infarction due to thrombosis of right carotid artery: Secondary | ICD-10-CM | POA: Diagnosis not present

## 2015-09-25 HISTORY — DX: Bipolar disorder, unspecified: F31.9

## 2015-09-25 LAB — DIFFERENTIAL
BASOS ABS: 0.1 10*3/uL (ref 0.0–0.1)
BASOS PCT: 1 %
Eosinophils Absolute: 0.3 10*3/uL (ref 0.0–0.7)
Eosinophils Relative: 5 %
LYMPHS ABS: 1.9 10*3/uL (ref 0.7–4.0)
Lymphocytes Relative: 35 %
MONOS PCT: 9 %
Monocytes Absolute: 0.5 10*3/uL (ref 0.1–1.0)
NEUTROS ABS: 2.7 10*3/uL (ref 1.7–7.7)
Neutrophils Relative %: 50 %

## 2015-09-25 LAB — CBC
HEMATOCRIT: 40.2 % (ref 36.0–46.0)
HEMOGLOBIN: 13.1 g/dL (ref 12.0–15.0)
MCH: 30 pg (ref 26.0–34.0)
MCHC: 32.6 g/dL (ref 30.0–36.0)
MCV: 92 fL (ref 78.0–100.0)
Platelets: 197 10*3/uL (ref 150–400)
RBC: 4.37 MIL/uL (ref 3.87–5.11)
RDW: 13.9 % (ref 11.5–15.5)
WBC: 5.5 10*3/uL (ref 4.0–10.5)

## 2015-09-25 LAB — COMPREHENSIVE METABOLIC PANEL
ALBUMIN: 4 g/dL (ref 3.5–5.0)
ALT: 47 U/L (ref 14–54)
AST: 29 U/L (ref 15–41)
Alkaline Phosphatase: 64 U/L (ref 38–126)
Anion gap: 7 (ref 5–15)
BILIRUBIN TOTAL: 0.8 mg/dL (ref 0.3–1.2)
BUN: 14 mg/dL (ref 6–20)
CHLORIDE: 108 mmol/L (ref 101–111)
CO2: 25 mmol/L (ref 22–32)
CREATININE: 0.79 mg/dL (ref 0.44–1.00)
Calcium: 9.6 mg/dL (ref 8.9–10.3)
GFR calc Af Amer: >60 mL/min (ref >60)
GLUCOSE: 110 mg/dL — AB (ref 65–99)
Potassium: 4.3 mmol/L (ref 3.5–5.1)
Sodium: 140 mmol/L (ref 135–145)
Total Protein: 7.1 g/dL (ref 6.5–8.1)

## 2015-09-25 LAB — ANTITHROMBIN III: ANTITHROMB III FUNC: 99 % (ref 75–120)

## 2015-09-25 LAB — I-STAT TROPONIN, ED: Troponin i, poc: 0 ng/mL (ref 0.00–0.08)

## 2015-09-25 LAB — CBG MONITORING, ED: Glucose-Capillary: 98 mg/dL (ref 65–99)

## 2015-09-25 MED ORDER — ASPIRIN 300 MG RE SUPP: 300.0000 mg | Freq: Every day | RECTAL | Status: DC

## 2015-09-25 MED ORDER — ASPIRIN 81 MG PO CHEW
324.0000 mg | CHEWABLE_TABLET | Freq: Once | ORAL | Status: AC
  Administered 2015-09-25: 324 mg via ORAL
  Filled 2015-09-25: qty 4

## 2015-09-25 MED ORDER — ASPIRIN 325 MG PO TABS
325.0000 mg | ORAL_TABLET | Freq: Every day | ORAL | Status: DC
  Administered 2015-09-26 – 2015-09-27 (×2): 325 mg via ORAL
  Filled 2015-09-25 (×2): qty 1

## 2015-09-25 MED ORDER — LORAZEPAM 1 MG PO TABS
2.0000 mg | ORAL_TABLET | Freq: Once | ORAL | Status: AC
  Administered 2015-09-25: 2 mg via ORAL
  Filled 2015-09-25: qty 2

## 2015-09-25 MED ORDER — ACETAMINOPHEN 325 MG PO TABS
650.0000 mg | ORAL_TABLET | Freq: Four times a day (QID) | ORAL | Status: DC | PRN
  Administered 2015-09-25 – 2015-09-27 (×4): 650 mg via ORAL
  Filled 2015-09-25 (×4): qty 2

## 2015-09-25 MED ORDER — SODIUM CHLORIDE 0.9 % IV SOLN
INTRAVENOUS | Status: AC
  Administered 2015-09-26: via INTRAVENOUS

## 2015-09-25 MED ORDER — LORATADINE 10 MG PO TABS
10.0000 mg | ORAL_TABLET | Freq: Every day | ORAL | Status: DC
  Administered 2015-09-26: 10 mg via ORAL
  Filled 2015-09-25 (×2): qty 1

## 2015-09-25 MED ORDER — ATORVASTATIN CALCIUM 80 MG PO TABS
80.0000 mg | ORAL_TABLET | Freq: Every day | ORAL | Status: DC
  Administered 2015-09-26: 80 mg via ORAL
  Filled 2015-09-25: qty 1

## 2015-09-25 MED ORDER — CITALOPRAM HYDROBROMIDE 10 MG PO TABS
10.0000 mg | ORAL_TABLET | Freq: Every day | ORAL | Status: DC
  Administered 2015-09-26: 10 mg via ORAL
  Filled 2015-09-25 (×2): qty 1

## 2015-09-25 MED ORDER — STROKE: EARLY STAGES OF RECOVERY BOOK
Freq: Once | Status: AC
  Administered 2015-09-25: 23:00:00
  Filled 2015-09-25: qty 1

## 2015-09-25 NOTE — H&P (Signed)
TRH H&P   Patient Demographics:    Jenna Delgado, is a 55 y.o. female  MRN: [id number]   DOB - Oct 14, 1960  Admit Date - 09/25/2015  Outpatient Primary MD for the patient is Gaye AlkenBARNES,ELIZABETH STEWART, MD  Referring MD/NP/PA: Raeford RazorStephen Kohut  Outpatient Specialists:     Patient coming from: home  Chief Complaint  Patient presents with  . left side numbness       HPI:    Jenna RistKaren Klas  is a 55 y.o. female, w Asthma, Glucose intolerance who presented with c/o L sided numbness.  Left hand weakness as well. Started sometime yesterday nite.  Pt woke up this am and it was still there and therefore came to the ER for evaluation.   In ED MRI brain showed posterior right frontal lobe CVA.,  Pt has mild elevation in bs =110.   Pt will be admitted for evaluation of CVA.     Review of systems:    In addition to the HPI above,  No Fever-chills, Slight Headache, No changes with Vision or hearing, No problems swallowing food or Liquids, No Chest pain, Cough or Shortness of Breath, No Abdominal pain, No Nausea or Vommitting, Bowel movements are regular, No Blood in stool or Urine, No dysuria, No new skin rashes or bruises, No new joints pains-aches,  No recent weight gain or loss, No polyuria, polydypsia or polyphagia, No significant Mental Stressors.  A full 10 point Review of Systems was done, except as stated above, all other Review of Systems were negative.   With Past History of the following :    Past Medical History  Diagnosis Date  . Asthma   . Prediabetes   . Seasonal allergic rhinitis   . Mild bipolar disorder [facility](HCC)       Past Surgical History  Procedure Laterality Date  . Abdominal hysterectomy    . Bladder suspension        Social History:     Social History  Substance Use Topics  . Smoking status: Never Smoker   . Smokeless tobacco: Never Used  .  Alcohol Use: 0.6 oz/week    1 Standard drinks or equivalent per week     Comment: occasionally     Lives -  At home  Mobility - typically ambulates without assitance.    Family History :     Family History  Problem Relation Age of Onset  . Adopted: Yes   Adopted   Home Medications:   Prior to Admission medications   Medication Sig Start Date End Date Taking? Authorizing Provider  cephALEXin (KEFLEX) 500 MG capsule Take 1 capsule (500 mg total) by mouth 4 (four) times daily. 07/13/13   Benjiman CoreNathan Pickering, MD  citalopram (CELEXA) 10 MG tablet Take 10 mg by mouth daily.    Historical Provider, MD  Cranberry 300 MG tablet Take 300 mg by mouth 2 (two)  times daily.    Historical Provider, MD  fexofenadine (ALLEGRA) 180 MG tablet Take 180 mg by mouth daily.    Historical Provider, MD  ibuprofen (ADVIL,MOTRIN) 200 MG tablet Take 600 mg by mouth every 6 (six) hours as needed for moderate pain.    Historical Provider, MD  lamoTRIgine (LAMICTAL) 25 MG tablet Take 37.5 mg by mouth daily.    Historical Provider, MD  oxyCODONE-acetaminophen (PERCOCET/ROXICET) 5-325 MG per tablet Take 1-2 tablets by mouth every 6 (six) hours as needed for severe pain. 07/13/13   Benjiman CoreNathan Pickering, MD     Allergies:     Allergies  Allergen Reactions  . Benadryl [Diphenhydramine Hcl] Other (See Comments)    Heartpounding, panic attack  . Imitrex [Sumatriptan] Other (See Comments)    Heartpounding, panic attack  . Iodine Other (See Comments)    IVP test problems  . Ivp Dye [Iodinated Diagnostic Agents] Other (See Comments)    IVP test problems  . Lobster [Shellfish Allergy] Itching  . Lamictal [Lamotrigine] Rash  . Tetracyclines & Related Rash    Blisters down the throat  . Theophyllines Rash    Target rash     Physical Exam:   Vitals  Blood pressure 113/75, pulse 91, temperature 98.6 F (37 C), temperature source Oral, resp. rate 14, height 5\' 4"  (1.626 m), weight 79.379 kg (175 lb), SpO2 97  %.   1. General  lying in bed in NAD,    2. Normal affect and insight, Not Suicidal or Homicidal, Awake Alert, Oriented X 3.  3. No F.N deficits, ALL C.Nerves Intact, Strength 5/5 all 4 extremities, Sensation intact all 4 extremities, Plantars down going.  4. Ears and Eyes appear Normal, Conjunctivae clear, PERRLA. Moist Oral Mucosa.  5. Supple Neck, No JVD, No cervical lymphadenopathy appriciated, No Carotid Bruits.  6. Symmetrical Chest wall movement, Good air movement bilaterally, CTAB.  7. RRR, No Gallops, Rubs or Murmurs, No Parasternal Heave.  8. Positive Bowel Sounds, Abdomen Soft, No tenderness, No organomegaly appriciated,No rebound -guarding or rigidity.  9.  No Cyanosis, Normal Skin Turgor, No Skin Rash or Bruise.  10. Good muscle tone,  joints appear normal , no effusions, Normal ROM.  11. No Palpable Lymph Nodes in Neck or Axillae   EKG nsr at 75, nl axis, no st t segment changes c/w ischemia   Data Review:    CBC  Recent Labs Lab 09/25/15 1403  WBC 5.5  HGB 13.1  HCT 40.2  PLT 197  MCV 92.0  MCH 30.0  MCHC 32.6  RDW 13.9  LYMPHSABS 1.9  MONOABS 0.5  EOSABS 0.3  BASOSABS 0.1   ------------------------------------------------------------------------------------------------------------------  Chemistries   Recent Labs Lab 09/25/15 1403  NA 140  K 4.3  CL 108  CO2 25  GLUCOSE 110*  BUN 14  CREATININE 0.79  CALCIUM 9.6  AST 29  ALT 47  ALKPHOS 64  BILITOT 0.8   ------------------------------------------------------------------------------------------------------------------ estimated creatinine clearance is 80.1 mL/min (by C-G formula based on Cr of 0.79). ------------------------------------------------------------------------------------------------------------------ No results for input(s): TSH, T4TOTAL, T3FREE, THYROIDAB in the last 72 hours.  Invalid input(s): FREET3  Coagulation profile No results for input(s): INR, PROTIME in  the last 168 hours. ------------------------------------------------------------------------------------------------------------------- No results for input(s): DDIMER in the last 72 hours. -------------------------------------------------------------------------------------------------------------------  Cardiac Enzymes No results for input(s): CKMB, TROPONINI, MYOGLOBIN in the last 168 hours.  Invalid input(s): CK ------------------------------------------------------------------------------------------------------------------ No results found for: BNP   ---------------------------------------------------------------------------------------------------------------  Urinalysis    Component Value Date/Time  COLORURINE YELLOW 04/24/2014 1435   APPEARANCEUR CLOUDY* 04/24/2014 1435   LABSPEC 1.019 04/24/2014 1435   PHURINE 5.0 04/24/2014 1435   GLUCOSEU NEGATIVE 04/24/2014 1435   HGBUR NEGATIVE 04/24/2014 1435   BILIRUBINUR NEGATIVE 04/24/2014 1435   KETONESUR NEGATIVE 04/24/2014 1435   PROTEINUR NEGATIVE 04/24/2014 1435   UROBILINOGEN 0.2 04/24/2014 1435   NITRITE NEGATIVE 04/24/2014 1435   LEUKOCYTESUR NEGATIVE 04/24/2014 1435    ----------------------------------------------------------------------------------------------------------------   Imaging Results:    Mr Brain Wo Contrast  09/25/2015  CLINICAL DATA:  Left hand numbness and decreased grip strength beginning this morning. Left-sided numbness as well. EXAM: MRI HEAD WITHOUT CONTRAST TECHNIQUE: Multiplanar, multiecho pulse sequences of the brain and surrounding structures were obtained without intravenous contrast. COMPARISON:  Head CT 04/24/2014 FINDINGS: There is a small acute cortical and subcortical infarct in the posterior right frontal lobe involving the precentral gyrus in the hand motor area. There is no evidence of intracranial hemorrhage, mass, midline shift, or extra-axial fluid collection. Ventricles and sulci  are normal. There are mild periventricular and subcortical white matter T2 hyperintensities. Orbits are unremarkable. Mild mucosal thickening is noted in the ethmoid and maxillary sinuses bilaterally. The mastoid air cells are clear. Major intracranial vascular flow voids are preserved. IMPRESSION: 1. Small acute posterior right frontal lobe infarct involving the hand motor area. 2. Mild cerebral white matter disease, nonspecific but may reflect chronic small vessel ischemia. Electronically Signed   By: Sebastian Ache M.D.   On: 09/25/2015 16:41       Assessment & Plan:    Active Problems:   Stroke (cerebrum) (HCC)   Hyperglycemia    1. CVA (right posterior frontal) Aspirin  po qday Start lipitor  po qhs Check hga1c, lipid Check tsh Check hypercoag w/up Check carotid u/s, cardiac echo Neurology consult in place by ED, appreciate input  2. Hyperglycemia Check hga1c   DVT Prophylaxis SCDs   AM Labs Ordered, also please review Full Orders  Family Communication: Admission, patients condition and plan of care including tests being ordered have been discussed with the patient who indicate understanding and agree with the plan and Code Status.  Code Status  FULL CODE  Likely DC to  home  Condition GUARDED    Consults called: neurology  Admission status: inpatient  Time spent in minutes : 45 minutes   Pearson Grippe M.D on 09/25/2015 at 7:46 PM  Between 7am to 7pm - Pager - [phone]. After 7pm go to [email or web address] - password [email or web address]  [facility] - Office  [phone]

## 2015-09-25 NOTE — ED Notes (Signed)
Patient here with left hand numbness and difficulty gripping since 0200. Now has left sided numbness with foot numbness since same. Speech clear, normal gait, no distress

## 2015-09-25 NOTE — ED Notes (Signed)
Per pt she went to bed about MN and noticed that her left arm is numb and left leg is numb woke up this am and numbness is still there states left side of face (cheek bone) is numb rt side of face feels ok

## 2015-09-25 NOTE — ED Notes (Signed)
Patient with onset of weakness in her hand last night and sx have progressed today.  She called her MD and was advised to come to ED

## 2015-09-25 NOTE — ED Provider Notes (Signed)
CSN: [id number]     Arrival date & time 09/25/15  1300 History   First MD Initiated Contact with Patient 09/25/15 1320     Chief Complaint  Patient presents with  . left side numbness       HPI Per pt she went to bed about MN and noticed that her left arm is numb and left leg is numb woke up this am and numbness is still there states left side of face (cheek bone) is numb rt side of face feels ok  Past Medical History  Diagnosis Date  . Asthma   . Prediabetes   . Seasonal allergic rhinitis   . Mild bipolar disorder [facility](HCC)    Past Surgical History  Procedure Laterality Date  . Abdominal hysterectomy    . Bladder suspension     Family History  Problem Relation Age of Onset  . Adopted: Yes   Social History  Substance Use Topics  . Smoking status: Never Smoker   . Smokeless tobacco: Never Used  . Alcohol Use: 0.6 oz/week    1 Standard drinks or equivalent per week     Comment: occasionally   OB History    No data available     Review of Systems  All other systems reviewed and are negative  Allergies  Benadryl; Imitrex; Iodine; Ivp dye; Lobster; Lamictal; Tetracyclines & related; and Theophyllines  Home Medications   Prior to Admission medications   Medication Sig Start Date End Date Taking? Authorizing Provider  citalopram (CELEXA) 10 MG tablet Take 10 mg by mouth daily.   Yes Historical Provider, MD  clonazePAM (KLONOPIN) 0.5 MG tablet Take 0.5 mg by mouth daily as needed for anxiety.  06/24/15  Yes Historical Provider, MD  esomeprazole (NEXIUM) 20 MG capsule Take 20 mg by mouth daily as needed (for heartburn or acid reflux).  06/24/15  Yes Historical Provider, MD  OLANZapine-FLUoxetine (SYMBYAX) 3-25 MG capsule Take 1 capsule by mouth at bedtime. 09/20/15  Yes Historical Provider, MD  aspirin 325 MG tablet Take 1 tablet (325 mg total) by mouth daily. 09/27/15   Catarina Hartshornavid Tat, MD  atorvastatin (LIPITOR) 80 MG tablet Take 1 tablet (80 mg total) by mouth daily at 6 PM.  09/27/15   Catarina Hartshornavid Tat, MD   BP 110/68 mmHg  Pulse 72  Temp(Src) 98.3 F (36.8 C) (Oral)  Resp 20  Ht 5\' 4"  (1.626 m)  Wt 175 lb (79.379 kg)  BMI 30.02 kg/m2  SpO2 99% Physical Exam Physical Exam  Nursing note and vitals reviewed. Constitutional: She is oriented to person, place, and time. She appears well-developed and well-nourished. No distress.  HENT:  Head: Normocephalic and atraumatic.  Eyes: Pupils are equal, round, and reactive to light.  Neck: Normal range of motion.  Cardiovascular: Normal rate and intact distal pulses.   Pulmonary/Chest: No respiratory distress.  Abdominal: Normal appearance. She exhibits no distension.  Musculoskeletal: Normal range of motion.  Neurological: She is alert and oriented to person, place, and time. No cranial nerve deficit.  Subjective numbness to left hand.  Good pulses in left wrist.  Good cap refill.  Extremities with no definite motor deficit.   Skin: Skin is warm and dry. No rash noted.  Psychiatric: She has a normal mood and affect. Her behavior is normal.   ED Course  Procedures (including critical care time) Labs Review Labs Reviewed  COMPREHENSIVE METABOLIC PANEL - Abnormal; Notable for the following:    Glucose, Bld 110 (*)    All  other components within normal limits  HEMOGLOBIN A1C - Abnormal; Notable for the following:    Hgb A1c MFr Bld 5.7 (*)    All other components within normal limits  COMPREHENSIVE METABOLIC PANEL - Abnormal; Notable for the following:    Glucose, Bld 111 (*)    All other components within normal limits  LIPID PANEL - Abnormal; Notable for the following:    Cholesterol 202 (*)    LDL Cholesterol 124 (*)    All other components within normal limits  CBC  DIFFERENTIAL  ANTITHROMBIN III  PROTEIN C ACTIVITY  PROTEIN C, TOTAL  PROTEIN S ACTIVITY  PROTEIN S, TOTAL  LUPUS ANTICOAGULANT PANEL  BETA-2-GLYCOPROTEIN I ABS, IGG/M/A  HOMOCYSTEINE  ANTINUCLEAR ANTIBODIES, IFA  SEDIMENTATION RATE   URINE RAPID DRUG SCREEN, HOSP PERFORMED  FACTOR 5 LEIDEN  PROTHROMBIN GENE MUTATION  CARDIOLIPIN ANTIBODIES, IGG, IGM, IGA  I-STAT TROPOININ, ED  CBG MONITORING, ED    Imaging Review Mr Shirlee LatchMra Head Wo Contrast  09/26/2015  CLINICAL DATA:  Continued surveillance of acute RIGHT frontal infarct. LEFT-sided sensory and motor symptoms. EXAM: MRA HEAD WITHOUT CONTRAST TECHNIQUE: Angiographic images of the Circle of Willis were obtained using MRA technique without intravenous contrast. COMPARISON:  MR brain 09/25/2015. FINDINGS: The internal carotid arteries are widely patent. The basilar artery is widely patent with vertebrals codominant. There is no intracranial stenosis or aneurysm. No MCA branch occlusion is evident. IMPRESSION: Unremarkable MRA of the intracranial circulation. Electronically Signed   By: Elsie StainJohn T Curnes M.D.   On: 09/26/2015 16:28   I have personally reviewed and evaluated these images and lab results as part of my medical decision-making.   EKG Interpretation   Date/Time:  Sunday September 25 2015 13:41:35 EDT Ventricular Rate:  73 PR Interval:    QRS Duration: 81 QT Interval:  419 QTC Calculation: 462 R Axis:   -7 Text Interpretation:  Sinus rhythm No significant change since last  tracing Confirmed by Odaly Peri  MD, Zellie Jenning ([id number]) on 09/25/2015 3:19:06 PM       MRI scan of the brain is pending at time of hand over. I spoke with the neurologist who will come to the emergency room and evaluate the patient and recommend disposition. MDM   Final diagnoses:  Acute ischemic stroke (HCC)        Nelva Nayobert Eugene Isadore, MD 09/28/15 [phone]

## 2015-09-25 NOTE — Progress Notes (Signed)
Upon assessment, I was checking her skin and the patient mentioned she had a rash on her back.  She has a rash that covers her entire back, will notify MD.  Patient states she just recently stopped taking Lamictal, which has a black box warning label for skin rash.  Patient would like an MD to look at it.

## 2015-09-25 NOTE — ED Notes (Signed)
Admitting MD at bedside.

## 2015-09-25 NOTE — Consult Note (Signed)
Neurology Consultation Reason for Consult: Left-sided weakness Referring Physician: Radford PaxBeaton, R  CC: Left-sided weakness  History is obtained from: Patient  HPI: Jenna Delgado is a 55 y.o. female who had not yet gone to sleep around 2 AM when she started noticing that her left side felt. She states that it didn't feel numb exactly, but that he just didn't seem to do what it was supposed to do. He thought that she might have been laying on it funny. She went to sleep and when she awoke it was still like that and therefore she presented to the emergency room today. He states that it feels like a static deficit. She denies other symptoms but does state that she has been under a lot of stress. She has had a lot of flying over the past few weeks.   LKW: 2 AM tpa given?: no, out of window    ROS: A 14 point ROS was performed and is negative except as noted in the HPI.   Past Medical History  Diagnosis Date  . Asthma   . Prediabetes   . Seasonal allergic rhinitis      Family history: stroke   Social History:  reports that she has never smoked. She has never used smokeless tobacco. She reports that she drinks alcohol. She reports that she does not use illicit drugs.   Exam: Current vital signs: BP 106/65 mmHg  Pulse 84  Temp(Src) 98.6 F (37 C) (Oral)  Resp 23  Ht 5\' 4"  (1.626 m)  Wt 79.379 kg (175 lb)  BMI 30.02 kg/m2  SpO2 97% Vital signs in last 24 hours: Temp:  [98.4 F (36.9 C)-98.6 F (37 C)] 98.6 F (37 C) (06/25 1440) Pulse Rate:  [71-84] 84 (06/25 1800) Resp:  [14-23] 23 (06/25 1800) BP: (104-134)/(64-86) 106/65 mmHg (06/25 1800) SpO2:  [96 %-98 %] 97 % (06/25 1800) Weight:  [79.379 kg (175 lb)] 79.379 kg (175 lb) (06/25 1311)   Physical Exam  Constitutional: Appears well-developed and well-nourished.  Psych: Affect appropriate to situation Eyes: No scleral injection HENT: No OP obstrucion Head: Normocephalic.  Cardiovascular: Normal rate and regular  rhythm.  Respiratory: Effort normal and breath sounds normal to anterior ascultation GI: Soft.  No distension. There is no tenderness.  Skin: WDI  Neuro: Mental Status: Patient is awake, alert, oriented to person, place, month, year, and situation. Patient is able to give a clear and coherent history. No signs of aphasia or neglect Cranial Nerves: II: Visual Fields are full. Pupils are equal, round, and reactive to light.   III,IV, VI: EOMI without ptosis or diploplia.  V: Facial sensation is diminished on the left VII: Facial movement is symmetric.  VIII: hearing is intact to voice X: Uvula elevates symmetrically XI: Shoulder shrug is symmetric. XII: tongue is midline without atrophy or fasciculations.  Motor: Tone is normal. Bulk is normal. 5/5 strength was present on the left, she has decreased range of motion in the ulnar aspect of her hand, as well as decreased plantar flexion in the left leg with relatively preserved dorsiflexion. Sensory: Sensation is diminished along the outer aspect of her upper arm as well as diffusely in her leg. Deep Tendon Reflexes: 2+ and symmetric in the biceps and patellae and ankle Cerebellar: FNF and HKS are intact on the right, consistent with weakness in the left    I have reviewed labs in epic and the results pertinent to this consultation are: CMP unremarkable  I have reviewed the images  obtained: MRI brain-small cortical infarct on the right  Impression: 55 year old female with small cortical infarct on the right. Though atherosclerotic disease is possible, I suspect this is more likely to be embolic. Given her recent flying, I would get a lower extremity Doppler in addition to her other usual workup.  Recommendations: 1. HgbA1c, fasting lipid panel 2. MRA  of the brain without contrast 3. Frequent neuro checks 4. Echocardiogram 5. Carotid dopplers 6. Prophylactic therapy-Antiplatelet med: Aspirin - dose 325mg  PO or 300mg  PR 7. Risk  factor modification 8. Telemetry monitoring 9. PT consult, OT consult, Speech consult 10. Lower extremity Dopplers 11. please page stroke NP  Or  PA  Or MD  M-F from 8am -4 pm starting 6/26 as this patient will be followed by the stroke team at this point.   You can look them up on [email or web address]      Ritta SlotMcNeill Kirkpatrick, MD [facility] [phone]  If 7pm- 7am, please page neurology on call as listed in AMION.

## 2015-09-26 ENCOUNTER — Inpatient Hospital Stay (HOSPITAL_COMMUNITY): Payer: BLUE CROSS/BLUE SHIELD

## 2015-09-26 DIAGNOSIS — E782 Mixed hyperlipidemia: Secondary | ICD-10-CM

## 2015-09-26 DIAGNOSIS — I6789 Other cerebrovascular disease: Secondary | ICD-10-CM

## 2015-09-26 DIAGNOSIS — I639 Cerebral infarction, unspecified: Secondary | ICD-10-CM

## 2015-09-26 DIAGNOSIS — E785 Hyperlipidemia, unspecified: Secondary | ICD-10-CM

## 2015-09-26 DIAGNOSIS — I63031 Cerebral infarction due to thrombosis of right carotid artery: Secondary | ICD-10-CM

## 2015-09-26 LAB — LIPID PANEL
CHOL/HDL RATIO: 4 ratio
Cholesterol: 202 mg/dL — ABNORMAL HIGH (ref 0–200)
HDL: 51 mg/dL (ref >40)
LDL Cholesterol: 124 mg/dL — ABNORMAL HIGH (ref 0–99)
TRIGLYCERIDES: 134 mg/dL (ref <150)
VLDL: 27 mg/dL (ref 0–40)

## 2015-09-26 LAB — ECHOCARDIOGRAM COMPLETE
E decel time: 264 msec
E/e' ratio: 7.67
FS: 35 % (ref 28–44)
HEIGHTINCHES: 64 in
IV/PV OW: 1.04
LADIAMINDEX: 2.22 cm/m2
LASIZE: 41 mm
LAVOL: 54.3 mL
LAVOLA4C: 48.8 mL
LAVOLIN: 29.4 mL/m2
LEFT ATRIUM END SYS DIAM: 41 mm
LV E/e'average: 7.67
LV TDI E'MEDIAL: 6.53
LV e' LATERAL: 7.72 cm/s
LVEEMED: 7.67
LVOT area: 2.27 cm2
LVOT diameter: 17 mm
MV Dec: 264
MV pk E vel: 59.2 m/s
MVPKAVEL: 70.8 m/s
PW: 7.5 mm — AB (ref 0.6–1.1)
TAPSE: 21.6 mm
TDI e' lateral: 7.72
WEIGHTICAEL: 2800 [oz_av]

## 2015-09-26 LAB — COMPREHENSIVE METABOLIC PANEL
ALBUMIN: 3.6 g/dL (ref 3.5–5.0)
ALK PHOS: 53 U/L (ref 38–126)
ALT: 43 U/L (ref 14–54)
ANION GAP: 6 (ref 5–15)
AST: 25 U/L (ref 15–41)
BILIRUBIN TOTAL: 0.9 mg/dL (ref 0.3–1.2)
BUN: 14 mg/dL (ref 6–20)
CALCIUM: 9.6 mg/dL (ref 8.9–10.3)
CO2: 25 mmol/L (ref 22–32)
Chloride: 109 mmol/L (ref 101–111)
Creatinine, Ser: 0.8 mg/dL (ref 0.44–1.00)
GFR calc non Af Amer: >60 mL/min (ref >60)
GLUCOSE: 111 mg/dL — AB (ref 65–99)
POTASSIUM: 3.9 mmol/L (ref 3.5–5.1)
SODIUM: 140 mmol/L (ref 135–145)
TOTAL PROTEIN: 6.5 g/dL (ref 6.5–8.1)

## 2015-09-26 LAB — VAS US CAROTID
LEFT VERTEBRAL DIAS: -19 cm/s
Left CCA dist dias: -24 cm/s
Left CCA dist sys: -75 cm/s
Left CCA prox dias: 34 cm/s
Left CCA prox sys: 104 cm/s
Left ICA dist dias: -35 cm/s
Left ICA dist sys: -86 cm/s
Left ICA prox dias: -31 cm/s
Left ICA prox sys: -106 cm/s
RCCADSYS: -93 cm/s
RCCAPDIAS: 22 cm/s
RIGHT ECA DIAS: -18 cm/s
RIGHT VERTEBRAL DIAS: 21 cm/s
Right CCA prox sys: 117 cm/s

## 2015-09-26 LAB — RAPID URINE DRUG SCREEN, HOSP PERFORMED
AMPHETAMINES: NOT DETECTED
BARBITURATES: NOT DETECTED
BENZODIAZEPINES: NOT DETECTED
Cocaine: NOT DETECTED
Opiates: NOT DETECTED
TETRAHYDROCANNABINOL: NOT DETECTED

## 2015-09-26 LAB — SEDIMENTATION RATE: SED RATE: 3 mm/h (ref 0–22)

## 2015-09-26 MED ORDER — ENOXAPARIN SODIUM 40 MG/0.4ML ~~LOC~~ SOLN
40.0000 mg | SUBCUTANEOUS | Status: DC
  Administered 2015-09-26: 40 mg via SUBCUTANEOUS
  Filled 2015-09-26: qty 0.4

## 2015-09-26 MED ORDER — HYDROCORTISONE 0.5 % EX CREA
TOPICAL_CREAM | Freq: Two times a day (BID) | CUTANEOUS | Status: DC
  Administered 2015-09-27: via TOPICAL
  Filled 2015-09-26: qty 28.35

## 2015-09-26 MED ORDER — ALUM & MAG HYDROXIDE-SIMETH 200-200-20 MG/5ML PO SUSP
30.0000 mL | Freq: Four times a day (QID) | ORAL | Status: DC | PRN
  Administered 2015-09-26: 30 mL via ORAL
  Filled 2015-09-26: qty 30

## 2015-09-26 NOTE — Evaluation (Signed)
Physical Therapy Evaluation Patient Details Name: Jenna CountsKaren M Delgado MRN: [id number] DOB: 1961-03-31 Today's Date: 09/26/2015   History of Present Illness  Jenna Delgado is a 55 y.o. female, w Asthma, Glucose intolerance who presented with c/o L sided numbness. Left hand weakness as well. Started sometime yesterday nite. 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 Pt presenting with L sided weakness, impaired co-ordination and impaired sensation. Pt also with mild balance impairment as noted in score of 16 ond DGI. Pt very motivated and would strongly benefit from outpt PT to address mentioned deficits to return to making art.    Follow Up Recommendations Outpatient PT;Supervision - Intermittent    Equipment Recommendations  None recommended by PT    Recommendations for Other Services       Precautions / Restrictions Precautions Precautions: Fall Restrictions Weight Bearing Restrictions: No      Mobility  Bed Mobility Overal bed mobility: Independent             General bed mobility comments: no difficulty  Transfers Overall transfer level: Needs assistance Equipment used: None Transfers: Sit to/from Stand Sit to Stand: Supervision         General transfer comment: mildly unsteady due to waking pt up from sleep and with L LE altered sensation and coordination  Ambulation/Gait Ambulation/Gait assistance: Min guard Ambulation Distance (Feet): 200 Feet Assistive device: None Gait Pattern/deviations: Step-to pattern;Decreased stride length;Staggering left (decreased L step height) Gait velocity: decrased Gait velocity interpretation: Below normal speed for age/gender General Gait Details: pt mildly unsteady but no overt LOB. pt repeatedly reports "my left leg just feels wierd" "like it can't keep up"  Stairs Stairs: Yes Stairs assistance: Min guard Stair Management: One rail  Right Number of Stairs: 12 General stair comments: cautious descending due to holding onto rail with L UE, pt able to reciprocally acend but had to do step to to descend  Wheelchair Mobility    Modified Rankin (Stroke Patients Only) Modified Rankin (Stroke Patients Only) Pre-Morbid Rankin Score: No symptoms Modified Rankin: Moderate disability     Balance                                 Standardized Balance Assessment Standardized Balance Assessment : Dynamic Gait Index   Dynamic Gait Index Level Surface: Mild Impairment Change in Gait Speed: Mild Impairment Gait with Horizontal Head Turns: Mild Impairment Gait with Vertical Head Turns: Mild Impairment Gait and Pivot Turn: Mild Impairment Step Over Obstacle: Mild Impairment Step Around Obstacles: Mild Impairment Steps: Mild Impairment Total Score: 16       Pertinent Vitals/Pain Pain Assessment: No/denies pain    Home Living Family/patient expects to be discharged to:: Private residence Living Arrangements: Spouse/significant other Available Help at Discharge: Family;Available PRN/intermittently Type of Home: House Home Access: Stairs to enter Entrance Stairs-Rails: None Entrance Stairs-Number of Steps: 3 Home Layout: Two level;Bed/bath upstairs Home Equipment: None      Prior Function Level of Independence: Independent         Comments: 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     Hand Dominance   Dominant Hand: Right    Extremity/Trunk Assessment   Upper Extremity Assessment: LUE deficits/detail       LUE Deficits / Details: grossly 3+/5   Lower Extremity Assessment: LLE deficits/detail   LLE Deficits /  Details: grossly 4-/5  Cervical / Trunk Assessment: Normal  Communication   Communication: No difficulties  Cognition Arousal/Alertness: Awake/alert Behavior During Therapy: WFL for tasks assessed/performed Overall Cognitive Status: Within Functional Limits  for tasks assessed                      General Comments General comments (skin integrity, edema, etc.): pt with noted rash on back    Exercises Other Exercises Other Exercises: gave theraputty for L hand to promote dexterity and strength      Assessment/Plan    PT Assessment Patient needs continued PT services  PT Diagnosis Generalized weakness   PT Problem List Decreased strength;Decreased range of motion;Decreased activity tolerance;Decreased balance;Decreased mobility  PT Treatment Interventions DME instruction;Gait training;Stair training;Functional mobility training;Therapeutic activities;Therapeutic exercise;Balance training   PT Goals (Current goals can be found in the Care Plan section) Acute Rehab PT Goals Patient Stated Goal: get back to art PT Goal Formulation: With patient Time For Goal Achievement: 10/03/15 Potential to Achieve Goals: Good Additional Goals Additional Goal #1: Pt to score >19 on DGI to indicate minimal falls risk.    Frequency Min 3X/week   Barriers to discharge Decreased caregiver support spouse works    Co-evaluation               End of Session Equipment Utilized During Treatment: Gait belt Activity Tolerance: Patient tolerated treatment well Patient left: in chair;with call bell/phone within reach;with family/visitor present Nurse Communication: Mobility status         Time: [id number] PT Time Calculation (min) (ACUTE ONLY): 36 min   Charges:   PT Evaluation $PT Eval Moderate Complexity: 1 Procedure PT Treatments $Gait Training: 8-22 mins   PT G CodesMarcene Brawn:        Julious Langlois Marie 09/26/2015, 9:02 AM   Lewis ShockAshly Joslin Doell, PT, DPT Pager #: [phone] Office #: [phone]

## 2015-09-26 NOTE — Progress Notes (Signed)
PROGRESS NOTE  Jenna Delgado PTW:[id number] DOB: 1960/04/23 DOA: 09/25/2015 PCP: Gerrit Heck, MD  Brief History:  55 year old female with a history of asthma, impaired glucose tolerance, depression/anxiety presented with one-day history of left upper and lower extremity numbness and "clumsy hand and leg" this started on the evening of 09/24/2015.  The patient initially thought this may have been related to recent starting and stopping of her Celexa and Symbiax and Lamictal. However, when she woke up on the morning of June 20 50,017, her symptoms do not resolve. As a result she presented to the emergency department for further evaluation.  MRI of the brain revealed a small acute right frontal infarct and associated small vessel disease. The patient was admitted for full stroke workup, and neurology was consulted.  Assessment/Plan: Acute right frontal stroke -Neurology Consult appreciated -PT/OT evaluation-->outpt PT, OT -Speech therapy eval -MRI brain--small right acute frontal infarct -MRA brain-- NO  No hemodynamically significant intracranial stenosis -Carotid Duplex--negative for hemodynamically significant stenosis -Echo-- EF 55-60 percent, grade 1 dd, no embolic source -ZGY--174 -HbA1C--pending -Continue aspirin 325 mg daily -627/17--TEE planned-->loop recorder if negative -follow up ANA, hypercoagulable panel -ESR 3 - Lower extremity duplex negative for dvt - personally reviewed ekg-- sinus rhythm,no st-t wave changes  Hyperglycemia -Hemoglobin A1c  Depression/anxiety -Continue Celexa  Hyperlipidemia - LDL 124 -start lipitor  Asthma -stable on RA    Disposition Plan:   Home 6/27 if stable Family Communication:   Daughter updated at bedside 6/26  Consultants:  Neurology, Cardiology  Code Status:  FULL / DNR  DVT Prophylaxis:  [place] Heparin / [place] Lovenox   Procedures: As Listed in Progress Note  Above  Antibiotics: None    Subjective:  PATIENT STILL FEELS THAT HER LEFT HAND AND LEFT LEG ARE STILL CLUMSY. SHE STATES THAT THESE ARE NOT MUCH BETTER. DENIES ANY FEVERS, CHILLS, CHEST PAIN, SHORTNESS OF BREATH, NAUSEA, VOMITING, DIARRHEA, ABDOMINAL PAIN. NO DYSURIA OR HEMATURIA.NO HEMATOCHEZIA OR MELENA.  Objective: Filed Vitals:   09/26/15 0000 09/26/15 0200 09/26/15 0400 09/26/15 0600  BP: 1'07/63 99/69 93/55 ' 105/70  Pulse: 80  64 72  Temp: 98.1 F (36.7 C) 98.2 F (36.8 C) 97.7 F (36.5 C) 97.8 F (36.6 C)  TempSrc: Oral Oral Oral Oral  Resp: '16 16 14 14  ' Height:      Weight:      SpO2: 97% 99% 95% 96%   No intake or output data in the 24 hours ending 09/26/15 1311 Weight change:  Exam:   General:  Pt is alert, follows commands appropriately, not in acute distress  HEENT: No icterus, No thrush, No neck mass, [place]/AT  Cardiovascular: RRR, S1/S2, no rubs, no gallops  Respiratory: CTA bilaterally, no wheezing, no crackles, no rhonchi  Abdomen: Soft/+BS, non tender, non distended, no guarding  Extremities: No edema, No lymphangitis, No petechiae, No rashes, no synovitis Neuro:  CN II-XII intact, strength 4/5 in RUE, RLE, strength 4-/5 LUE, LLE; sensation intact bilateral; no dysmetria; babinski equivocal    Data Reviewed: I have personally reviewed following labs and imaging studies Basic Metabolic Panel:  Recent Labs Lab 09/25/15 1403 09/26/15 0225  NA 140 140  K 4.3 3.9  CL 108 109  CO2 25 25  GLUCOSE 110* 111*  BUN 14 14  CREATININE 0.79 0.80  CALCIUM 9.6 9.6   Liver Function Tests:  Recent Labs Lab 09/25/15 1403 09/26/15 0225  AST 29 25  ALT 47 43  ALKPHOS 64 53  BILITOT 0.8 0.9  PROT 7.1 6.5  ALBUMIN 4.0 3.6   No results for input(s): LIPASE, AMYLASE in the last 168 hours. No results for input(s): AMMONIA in the last 168 hours. Coagulation Profile: No results for input(s): INR, PROTIME in the last 168 hours. CBC:  Recent Labs Lab  09/25/15 1403  WBC 5.5  NEUTROABS 2.7  HGB 13.1  HCT 40.2  MCV 92.0  PLT 197   Cardiac Enzymes: No results for input(s): CKTOTAL, CKMB, CKMBINDEX, TROPONINI in the last 168 hours. BNP: Invalid input(s): POCBNP CBG:  Recent Labs Lab 09/25/15 1717  GLUCAP 98   HbA1C: No results for input(s): HGBA1C in the last 72 hours. Urine analysis:    Component Value Date/Time   COLORURINE YELLOW 04/24/2014 1435   APPEARANCEUR CLOUDY* 04/24/2014 1435   LABSPEC 1.019 04/24/2014 1435   PHURINE 5.0 04/24/2014 1435   GLUCOSEU NEGATIVE 04/24/2014 1435   HGBUR NEGATIVE 04/24/2014 1435   BILIRUBINUR NEGATIVE 04/24/2014 1435   KETONESUR NEGATIVE 04/24/2014 1435   PROTEINUR NEGATIVE 04/24/2014 1435   UROBILINOGEN 0.2 04/24/2014 1435   NITRITE NEGATIVE 04/24/2014 1435   LEUKOCYTESUR NEGATIVE 04/24/2014 1435   Sepsis Labs: '@LABRCNTIP' (procalcitonin:4,lacticidven:4) )No results found for this or any previous visit (from the past 240 hour(s)).   Scheduled Meds: . aspirin  300 mg Rectal Daily   Or  . aspirin  325 mg Oral Daily  . atorvastatin  80 mg Oral q1800  . citalopram  10 mg Oral Daily  . loratadine  10 mg Oral Daily   Continuous Infusions:   Procedures/Studies: Dg Chest 2 View  09/26/2015  CLINICAL DATA:  Stroke.  History of asthma. EXAM: CHEST  2 VIEW COMPARISON:  Chest radiograph December 17, 2012 FINDINGS: The heart size and mediastinal contours are within normal limits. Mild bronchitic changes. Both lungs are clear. The visualized skeletal structures are unremarkable. IMPRESSION: Mild bronchitic changes. Electronically Signed   By: Elon Alas M.D.   On: 09/26/2015 00:33   Mr Brain Wo Contrast  09/25/2015  CLINICAL DATA:  Left hand numbness and decreased grip strength beginning this morning. Left-sided numbness as well. EXAM: MRI HEAD WITHOUT CONTRAST TECHNIQUE: Multiplanar, multiecho pulse sequences of the brain and surrounding structures were obtained without  intravenous contrast. COMPARISON:  Head CT 04/24/2014 FINDINGS: There is a small acute cortical and subcortical infarct in the posterior right frontal lobe involving the precentral gyrus in the hand motor area. There is no evidence of intracranial hemorrhage, mass, midline shift, or extra-axial fluid collection. Ventricles and sulci are normal. There are mild periventricular and subcortical white matter T2 hyperintensities. Orbits are unremarkable. Mild mucosal thickening is noted in the ethmoid and maxillary sinuses bilaterally. The mastoid air cells are clear. Major intracranial vascular flow voids are preserved. IMPRESSION: 1. Small acute posterior right frontal lobe infarct involving the hand motor area. 2. Mild cerebral white matter disease, nonspecific but may reflect chronic small vessel ischemia. Electronically Signed   By: Logan Bores M.D.   On: 09/25/2015 16:41    Ysabella Babiarz, DO  [facility] Pager [phone]  If 7PM-7AM, please contact night-coverage [email or web address] Password [email or web address] 09/26/2015, 1:11 PM   LOS: 1 day

## 2015-09-26 NOTE — Progress Notes (Signed)
STROKE TEAM PROGRESS NOTE   HISTORY OF PRESENT ILLNESS (per record) Jenna Delgado is a 55 y.o. female who had not yet gone to sleep around 2 AM 09/25/2015 when she started noticing that her left side felt funny. She states that it didn't feel numb exactly, but that he just didn't seem to do what it was supposed to do. He thought that she might have been laying on it funny. She went to sleep and when she awoke it was still like that and therefore she presented to the emergency room today. He states that it feels like a static deficit. She denies other symptoms but does state that she has been under a lot of stress. She has had a lot of flying over the past few weeks. Patient was not administered IV t-PA secondary to delay in arrival. She was admitted  for further evaluation and treatment.   SUBJECTIVE (INTERVAL HISTORY) Her daughter is at the bedside.  Overall she feels her condition is stable. She recounted HPI. She is frustrated that her L hand is not moving like it is supposed to - no numb, just not working. Feels the entire L side of her body is "not right", "feels funny".   OBJECTIVE Temp:  [97.7 F (36.5 C)-98.6 F (37 C)] 97.8 F (36.6 C) (06/26 0600) Pulse Rate:  [64-91] 72 (06/26 0600) Cardiac Rhythm:  [-] Normal sinus rhythm (06/26 0755) Resp:  [14-23] 14 (06/26 0600) BP: (93-134)/(55-86) 105/70 mmHg (06/26 0600) SpO2:  [95 %-99 %] 96 % (06/26 0600) Weight:  [79.379 kg (175 lb)] 79.379 kg (175 lb) (06/25 1311)  CBC:   Recent Labs Lab 09/25/15 1403  WBC 5.5  NEUTROABS 2.7  HGB 13.1  HCT 40.2  MCV 92.0  PLT 093    Basic Metabolic Panel:   Recent Labs Lab 09/25/15 1403 09/26/15 0225  NA 140 140  K 4.3 3.9  CL 108 109  CO2 25 25  GLUCOSE 110* 111*  BUN 14 14  CREATININE 0.79 0.80  CALCIUM 9.6 9.6    Lipid Panel:     Component Value Date/Time   CHOL 202* 09/26/2015 0225   TRIG 134 09/26/2015 0225   HDL 51 09/26/2015 0225   CHOLHDL 4.0 09/26/2015 0225    VLDL 27 09/26/2015 0225   LDLCALC 124* 09/26/2015 0225   HgbA1c: No results found for: HGBA1C Urine Drug Screen: No results found for: LABOPIA, COCAINSCRNUR, LABBENZ, AMPHETMU, THCU, LABBARB    IMAGING  Dg Chest 2 View  09/26/2015  CLINICAL DATA:  Stroke.  History of asthma. EXAM: CHEST  2 VIEW COMPARISON:  Chest radiograph December 17, 2012 FINDINGS: The heart size and mediastinal contours are within normal limits. Mild bronchitic changes. Both lungs are clear. The visualized skeletal structures are unremarkable. IMPRESSION: Mild bronchitic changes. Electronically Signed   By: Elon Alas M.D.   On: 09/26/2015 00:33   Mr Brain Wo Contrast  09/25/2015  CLINICAL DATA:  Left hand numbness and decreased grip strength beginning this morning. Left-sided numbness as well. EXAM: MRI HEAD WITHOUT CONTRAST TECHNIQUE: Multiplanar, multiecho pulse sequences of the brain and surrounding structures were obtained without intravenous contrast. COMPARISON:  Head CT 04/24/2014 FINDINGS: There is a small acute cortical and subcortical infarct in the posterior right frontal lobe involving the precentral gyrus in the hand motor area. There is no evidence of intracranial hemorrhage, mass, midline shift, or extra-axial fluid collection. Ventricles and sulci are normal. There are mild periventricular and subcortical white matter T2 hyperintensities.  Orbits are unremarkable. Mild mucosal thickening is noted in the ethmoid and maxillary sinuses bilaterally. The mastoid air cells are clear. Major intracranial vascular flow voids are preserved. IMPRESSION: 1. Small acute posterior right frontal lobe infarct involving the hand motor area. 2. Mild cerebral white matter disease, nonspecific but may reflect chronic small vessel ischemia. Electronically Signed   By: Logan Bores M.D.   On: 09/25/2015 16:41   LE dopplers   No evidence of deep or superficial vein thrombosis in both legs.   Carotid Doppler   There is 1-39%  bilateral ICA stenosis. Vertebral artery flow is antegrade.    2D Echocardiogram  - Left ventricle: The cavity size was normal. Systolic function was  normal. The estimated ejection fraction was in the range of 55%  to 60%. Wall motion was normal; there were no regional wall  motion abnormalities. Doppler parameters are consistent with  abnormal left ventricular relaxation (grade 1 diastolic  dysfunction). There was no evidence of elevated ventricular  filling pressure by Doppler parameters. - Aortic valve: There was no regurgitation. - Mitral valve: Structurally normal valve. There was trivial  regurgitation. - Left atrium: The atrium was mildly dilated. - Right ventricle: Systolic function was normal. - Right atrium: The atrium was normal in size. - Tricuspid valve: There was mild regurgitation. - Pulmonic valve: There was no regurgitation. - Pulmonary arteries: Systolic pressure was within the normal  range. - Pericardium, extracardiac: The pericardium was normal in  appearance.  Impressions:  - No cardiac source of emboli was indentified.    PHYSICAL EXAM Pleasant middle aged mildly obese Caucasian lady  not in distress. . Afebrile. Head is nontraumatic. Neck is supple without bruit.    Cardiac exam no murmur or gallop. Lungs are clear to auscultation. Distal pulses are well felt. Neurological Exam :  Awake alert oriented 3 with normal speech and language function. No aphasia or apraxia dysarthria. Pupils are equal reactive. Fundi were not visualized. Vision acuity and fields seem adequate. Face is symmetric without weakness. Tongue is midline. Motor system exam no upper or lower eczema to drift. Mild weakness of left grip and intrinsic hand muscles. Orbits right over left upper extremity. Findings in the movements are diminished on the left. Mild subjective left hemibody sensory loss to touch pinprick and vibration but splitting of the midline. The patient is a  symmetric. Plantars are downgoing. Gait was not tested. ASSESSMENT/PLAN [name] is a 55 y.o. female with history of prediabetes, obesity presenting with left sided sensory loss. She did not receive IV t-PA due to late presentation.   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 small vessel disease   MRA  ordered  Carotid Doppler  No significant stenosis   2D Echo  EF 55-60%. No source of embolus   LE dopplers negative   LDL 124  Hypercoagulation panel pending   Check ANA, ESR TEE to look for embolic source. Have asked [facility] to schedule for an OP  . If TEE negative, a [facility] electrophysiologist will consult and consider placement of an implantable loop recorder to evaluate for atrial fibrillation as etiology of stroke. This has been explained to patient/family by Dr. Leonie Man and they are agreeable.   HgbA1c pending   Lovenox for VTE prophylaxis Diet NPO time specified  No antithrombotic  prior to admission, now on aspirin 325 mg daily. Recommend taking with food   Patient counseled to be compliant with her antithrombotic medications  Ongoing aggressive stroke risk factor management  Therapy recommendations:  OP PT, OP OT  Disposition:  Return home  Hyperlipidemia  Home meds:  No statin  LDL 124, goal < 70  lipitor 80 mg already added  Continue statin at discharge  Pre-Diabetes  HgbA1c pending goal < 7.0  Other Stroke Risk Factors  ETOH use, advised to drink no more than 1 drink(s) a day  Obesity, Body mass index is 30.02 kg/(m^2)., recommend weight loss, diet and exercise as appropriate   Family hx stroke ( unknown as she is adiopted)  Hospital day # [place] for Pager information 09/26/2015 1:11 PM   I have personally examined this patient, reviewed notes, independently viewed imaging studies, participated in medical decision making and plan of care. I have made any additions or clarifications directly to the above note. Agree with note above.  She presented with left-sided numbness and proprioceptive difficulties secondary to right MCA branch infarct of embolic etiology with source to be determined. She remains at risk for neurological worsening, recurrent stroke, TIA needs ongoing stroke evaluation. She is likely to need TEE and loop recorder insertion but this can be arranged as an outpatient in it cannot be done tomorrow and she can be discharged. Greater than 50% of time during this 35 minute visit was spent on counseling and coordination of care about stroke risk, risk prevention and answering questions Antony Contras, MD Medical Director [facility] [place] Pager: [phone] 09/26/2015 3:58 PM    To contact Stroke Continuity provider, please refer to [email or web address] After hours, contact General Neurology

## 2015-09-26 NOTE — Progress Notes (Signed)
  Echocardiogram 2D Echocardiogram has been performed.  Arvil ChacoFoster, Glenroy Crossen 09/26/2015, 9:27 AM

## 2015-09-26 NOTE — Progress Notes (Signed)
Preliminary results by tech - Carotid Duplex Completed. No evidence of stenosis noted in bilateral carotid arteries. Vertebral arteries demonstrate antegrade flow. Taylee Gunnells, BS, RDMS, RVT  

## 2015-09-26 NOTE — Evaluation (Signed)
Speech Language Pathology Evaluation Patient Details Name: Jenna CountsKaren M Coreas MRN: [id number] DOB: 06-18-1960 Today's Date: 09/26/2015 Time: [id number] SLP Time Calculation (min) (ACUTE ONLY): 17 min  Problem List:  Patient Active Problem List   Diagnosis Date Noted  . Stroke (cerebrum) (HCC) 09/25/2015  . Hyperglycemia 09/25/2015  . Acute ischemic stroke [facility](HCC)    Past Medical History:  Past Medical History  Diagnosis Date  . Asthma   . Prediabetes   . Seasonal allergic rhinitis   . Mild bipolar disorder [facility](HCC)    Past Surgical History:  Past Surgical History  Procedure Laterality Date  . Abdominal hysterectomy    . Bladder suspension     HPI:  Pt is a 55 y.o. female with PMH of asthma and glucose intolerance, presented to ED 6/25 with L side numbness/ L hand weakness. MRI showed small acute posterior R frontal lobe infarct involving the precentral gyrus in the hand motor area. Passed RN stroke swallow screen. Speech language eval ordered as part of stroke workup.    Assessment / Plan / Recommendation Clinical Impression  Pt demonstrating cognitive-linguistic and motor speech skills within functional limits at this time for tasks assessed. Pt did express concern with occasional difficulty recalling people's names today. Attempted short term memory task; pt recalled 2 out of 3 named items and recalled the 3rd with cues. No other difficulties noted. Encouraged pt to monitor cognition/ short-term memory upon discharge and if difficulties persist, inform MD. SLP will sign off at this time; please re-consult if needs arise.    SLP Assessment  Patient does not need any further Speech Lanaguage Pathology Services    Follow Up Recommendations  None    Frequency and Duration           SLP Evaluation Prior Functioning  Cognitive/Linguistic Baseline: Within functional limits Type of Home: House Available Help at Discharge: Family;Available PRN/intermittently Vocation:  Archivist(artist)    Cognition  Overall Cognitive Status: Within Functional Limits for tasks assessed Arousal/Alertness: Awake/alert Orientation Level: Oriented X4 Attention: Selective Selective Attention: Appears intact Memory: Appears intact Awareness: Appears intact Problem Solving: Appears intact Executive Function: Sequencing;Decision Making Sequencing: Appears intact Decision Making: Appears intact Safety/Judgment: Appears intact    Comprehension  Auditory Comprehension Overall Auditory Comprehension: Appears within functional limits for tasks assessed Yes/No Questions: Within Functional Limits Conversation: Complex Visual Recognition/Discrimination Discrimination: Within Function Limits Reading Comprehension Reading Status: Within funtional limits    Expression Expression Primary Mode of Expression: Verbal Verbal Expression Overall Verbal Expression: Appears within functional limits for tasks assessed Initiation: No impairment Level of Generative/Spontaneous Verbalization: Conversation Naming: No impairment Pragmatics: No impairment Non-Verbal Means of Communication: Not applicable Written Expression Dominant Hand: Right   Oral / Motor  Oral Motor/Sensory Function Overall Oral Motor/Sensory Function: Within functional limits Motor Speech Overall Motor Speech: Appears within functional limits for tasks assessed Respiration: Within functional limits Phonation: Normal Resonance: Within functional limits Articulation: Within functional limitis Intelligibility: Intelligible Motor Planning: Witnin functional limits Motor Speech Errors: Not applicable   GO                    Metro KungOleksiak, Bardia Wangerin K, MA, CCC-SLP 09/26/2015, 2:53 PM [phone]

## 2015-09-26 NOTE — Progress Notes (Signed)
OT Cancellation Note  Patient Details Name: Earlie CountsKaren M Basil MRN: [id number] DOB: March 21, 1961   Cancelled Treatment:    Reason Eval/Treat Not Completed: Patient at procedure or test/ unavailable. Pt currently out of room for a test. Will try back at a later time as schedule allows.  Evette GeorgesLeonard, Adama Ferber Eva [phone] 09/26/2015, 10:21 AM

## 2015-09-26 NOTE — Progress Notes (Signed)
Preliminary results by tech - Venous Duplex Lower Ext. Completed. No evidence of deep or superficial vein thrombosis in both legs. Kaeson Kleinert, BS, RDMS, RVT  

## 2015-09-26 NOTE — Care Management Note (Signed)
Case Management Note  Patient Details  Name: Jenna CountsKaren M Delgado MRN: [id number] Date of Birth: 03/18/1961  Subjective/Objective:    Pt admitted with CVA. She is from home with spouse.                 Action/Plan: PT recommendations are for outpatient services. CM following for d/c needs.   Expected Discharge Date:                  Expected Discharge Plan:  Home/Self Care  In-House Referral:     Discharge planning Services     Post Acute Care Choice:    Choice offered to:     DME Arranged:    DME Agency:     HH Arranged:    HH Agency:     Status of Service:  In process, will continue to follow  If discussed at Long Length of Stay Meetings, dates discussed:    Additional Comments:  Kermit BaloKelli F Gurjot Brisco, RN 09/26/2015, 10:54 AM

## 2015-09-26 NOTE — Evaluation (Signed)
Occupational Therapy Evaluation Patient Details Name: Jenna CountsKaren M Delgado MRN: [id number] DOB: May 03, 1960 Today's Date: 09/26/2015    History of Present Illness Jenna RistKaren Delgado is a 55 y.o. female, w Asthma, Glucose intolerance who presented with c/o L sided numbness. Left hand weakness as well. Started sometime yesterday nite. 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   Clinical Impression   [name] admitted and found to have above presents to acute OT with deficits below thus affecting her PLOF with basic ADLS, IADLs, and work activities. She will benefit from acute OT with follow up OP OT to help facilitate getting the use of her LUE back to as close to PLOF as possible.    Follow Up Recommendations  Outpatient OT    Equipment Recommendations  None recommended by OT       Precautions / Restrictions Precautions Precautions: Fall Restrictions Weight Bearing Restrictions: No      Mobility Bed Mobility Overal bed mobility: Independent             General bed mobility comments: no difficulty                Vision Vision Assessment?: Yes Eye Alignment: Within Functional Limits Ocular Range of Motion: Within Functional Limits (does have a lazy eye) Alignment/Gaze Preference: Within Defined Limits Tracking/Visual Pursuits: Able to track stimulus in all quads without difficulty Convergence: Within functional limits Visual Fields: No apparent deficits          Pertinent Vitals/Pain Pain Assessment: No/denies pain     Hand Dominance Right   Extremity/Trunk Assessment Upper Extremity Assessment Upper Extremity Assessment: LUE deficits/detail LUE Deficits / Details: grossly 3+/5; pt reports that she can feel her left digits 3-5 (however upon testing her light touch is not as strong on LUE as RUE) and they are not moving as they should. Upon further testing with her LUE, she is also having minmal issues with digits 1-2 LUE Sensation: decreased light  touch LUE Coordination: decreased fine motor;decreased gross motor           Communication Communication Communication: No difficulties   Cognition Arousal/Alertness: Awake/alert Behavior During Therapy: WFL for tasks assessed/performed Overall Cognitive Status: Within Functional Limits for tasks assessed                        xercises   Other Exercises Other Exercises: Issued pt 2 handouts (one with therapy putty exercises and one with FM activties on it). I went over every one that I wanted her to do and wrote in a couple (folding laundry, hanging laundry, taking non-breakable items out of a cabinet and putting them back in. 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 I made her aware that repetition is key and also to use her vision to help compensate for her decreased proprioception of her hand        Home Living Family/patient expects to be discharged to:: Private residence Living Arrangements: Spouse/significant other Available Help at Discharge: Family;Available PRN/intermittently Type of Home: House Home Access: Stairs to enter Entergy CorporationEntrance Stairs-Number of Steps: 3 Entrance Stairs-Rails: None Home Layout: Two level;Bed/bath upstairs Alternate Level Stairs-Number of Steps: flight Alternate Level Stairs-Rails: Right Bathroom Shower/Tub: Walk-in shower;Door Shower/tub characteristics: Sport and exercise psychologistDoor Bathroom Toilet: Standard     Home Equipment: Shower seat - built in          Prior Functioning/Environment  Level of Independence: Independent        Comments: 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, plays piano, does alot of key boarding    OT Diagnosis: Generalized weakness (hemiplegia of left side (pt is ambidextrous))   OT Problem List: Decreased strength;Impaired UE functional use;Decreased coordination   OT Treatment/Interventions: Self-care/ADL  training;Patient/family education;Balance training;Therapeutic activities;Therapeutic exercise    OT Goals(Current goals can be found in the care plan section) Acute Rehab OT Goals Patient Stated Goal: to get LUE functioning back to normal so she can do her art work again OT Goal Formulation: With patient Time For Goal Achievement: 10/03/15 Potential to Achieve Goals: Good  OT Frequency: Min 3X/week              End of Session    Activity Tolerance: Patient tolerated treatment well Patient left: in bed;with call bell/phone within reach;with family/visitor present   Time: [id number] OT Time Calculation (min): 49 min Charges:  OT General Charges $OT Visit: 1 Procedure OT Evaluation $OT Eval Moderate Complexity: 1 Procedure OT Treatments $Therapeutic Exercise: 23-37 mins  Jenna GeorgesLeonard, Jenna Delgado Eva [phone] 09/26/2015, 2:46 PM

## 2015-09-27 DIAGNOSIS — I639 Cerebral infarction, unspecified: Secondary | ICD-10-CM

## 2015-09-27 LAB — ANTINUCLEAR ANTIBODIES, IFA: ANTINUCLEAR ANTIBODIES, IFA: NEGATIVE

## 2015-09-27 LAB — HEMOGLOBIN A1C
Hgb A1c MFr Bld: 5.7 % — ABNORMAL HIGH (ref 4.8–5.6)
Mean Plasma Glucose: 117 mg/dL

## 2015-09-27 LAB — LUPUS ANTICOAGULANT PANEL
DRVVT: 40 s (ref 0.0–47.0)
PTT LA: 38.6 s (ref 0.0–43.6)

## 2015-09-27 LAB — PROTEIN C ACTIVITY: PROTEIN C ACTIVITY: 138 % (ref 73–180)

## 2015-09-27 LAB — HOMOCYSTEINE: HOMOCYSTEINE-NORM: 11.5 umol/L (ref 0.0–15.0)

## 2015-09-27 LAB — PROTEIN S, TOTAL: Protein S Ag, Total: 137 % (ref 60–150)

## 2015-09-27 LAB — PROTEIN C, TOTAL: Protein C, Total: 110 % (ref 60–150)

## 2015-09-27 LAB — PROTEIN S ACTIVITY: Protein S Activity: 125 % (ref 63–140)

## 2015-09-27 MED ORDER — OXYCODONE-ACETAMINOPHEN 5-325 MG PO TABS
2.0000 | ORAL_TABLET | Freq: Once | ORAL | Status: AC
  Administered 2015-09-27: 2 via ORAL
  Filled 2015-09-27: qty 2

## 2015-09-27 MED ORDER — ATORVASTATIN CALCIUM 80 MG PO TABS: 80.0000 mg | ORAL_TABLET | Freq: Every day | ORAL | Status: DC

## 2015-09-27 MED ORDER — CLONAZEPAM 0.5 MG PO TABS: 0.5000 mg | ORAL_TABLET | Freq: Every day | ORAL | Status: DC | PRN

## 2015-09-27 MED ORDER — KETOROLAC TROMETHAMINE 30 MG/ML IJ SOLN
30.0000 mg | Freq: Once | INTRAMUSCULAR | Status: AC
  Administered 2015-09-27: 30 mg via INTRAVENOUS
  Filled 2015-09-27: qty 1

## 2015-09-27 MED ORDER — ASPIRIN 325 MG PO TABS: 325.0000 mg | ORAL_TABLET | Freq: Every day | ORAL | Status: DC

## 2015-09-27 MED ORDER — CLONAZEPAM 0.5 MG PO TABS: 0.5000 mg | ORAL_TABLET | ORAL | Status: DC

## 2015-09-27 NOTE — Progress Notes (Signed)
STROKE TEAM PROGRESS NOTE   HISTORY OF PRESENT ILLNESS (per record) Jenna Delgado is a 55 y.o. female who had not yet gone to sleep around 2 AM 09/25/2015 when she started noticing that her left side felt funny. She states that it didn't feel numb exactly, but that he just didn't seem to do what it was supposed to do. He thought that she might have been laying on it funny. She went to sleep and when she awoke it was still like that and therefore she presented to the emergency room today. He states that it feels like a static deficit. She denies other symptoms but does state that she has been under a lot of stress. She has had a lot of flying over the past few weeks. Patient was not administered IV t-PA secondary to delay in arrival. She was admitted  for further evaluation and treatment.   SUBJECTIVE (INTERVAL HISTORY) Her family is not  at the bedside.  Overall she feels her condition is is worse mainly because she appears emotionally upset. She takes Klonopin at home but she has not taken for the past few days. She sees psychiatrist Dr. Chucky May   OBJECTIVE Temp:  [97.8 F (36.6 C)-98.7 F (37.1 C)] 98.3 F (36.8 C) (06/27 0929) Pulse Rate:  [72-95] 72 (06/27 0929) Cardiac Rhythm:  [-] Normal sinus rhythm (06/27 0700) Resp:  [18-20] 20 (06/27 0929) BP: (103-129)/(65-79) 110/68 mmHg (06/27 0929) SpO2:  [95 %-100 %] 99 % (06/27 0929)  CBC:   Recent Labs Lab 09/25/15 1403  WBC 5.5  NEUTROABS 2.7  HGB 13.1  HCT 40.2  MCV 92.0  PLT 710    Basic Metabolic Panel:   Recent Labs Lab 09/25/15 1403 09/26/15 0225  NA 140 140  K 4.3 3.9  CL 108 109  CO2 25 25  GLUCOSE 110* 111*  BUN 14 14  CREATININE 0.79 0.80  CALCIUM 9.6 9.6    Lipid Panel:     Component Value Date/Time   CHOL 202* 09/26/2015 0225   TRIG 134 09/26/2015 0225   HDL 51 09/26/2015 0225   CHOLHDL 4.0 09/26/2015 0225   VLDL 27 09/26/2015 0225   LDLCALC 124* 09/26/2015 0225   HgbA1c:  Lab Results   Component Value Date   HGBA1C 5.7* 09/26/2015   Urine Drug Screen:     Component Value Date/Time   LABOPIA NONE DETECTED 09/26/2015 1635   COCAINSCRNUR NONE DETECTED 09/26/2015 1635   LABBENZ NONE DETECTED 09/26/2015 1635   AMPHETMU NONE DETECTED 09/26/2015 1635   THCU NONE DETECTED 09/26/2015 1635   LABBARB NONE DETECTED 09/26/2015 1635      IMAGING  Dg Chest 2 View  09/26/2015  CLINICAL DATA:  Stroke.  History of asthma. EXAM: CHEST  2 VIEW COMPARISON:  Chest radiograph December 17, 2012 FINDINGS: The heart size and mediastinal contours are within normal limits. Mild bronchitic changes. Both lungs are clear. The visualized skeletal structures are unremarkable. IMPRESSION: Mild bronchitic changes. Electronically Signed   By: Elon Alas M.D.   On: 09/26/2015 00:33   Mr Jodene Nam Head Wo Contrast  09/26/2015  CLINICAL DATA:  Continued surveillance of acute RIGHT frontal infarct. LEFT-sided sensory and motor symptoms. EXAM: MRA HEAD WITHOUT CONTRAST TECHNIQUE: Angiographic images of the Circle of Willis were obtained using MRA technique without intravenous contrast. COMPARISON:  MR brain 09/25/2015. FINDINGS: The internal carotid arteries are widely patent. The basilar artery is widely patent with vertebrals codominant. There is no intracranial stenosis or aneurysm.  No MCA branch occlusion is evident. IMPRESSION: Unremarkable MRA of the intracranial circulation. Electronically Signed   By: Staci Righter M.D.   On: 09/26/2015 16:28   Mr Brain Wo Contrast  09/25/2015  CLINICAL DATA:  Left hand numbness and decreased grip strength beginning this morning. Left-sided numbness as well. EXAM: MRI HEAD WITHOUT CONTRAST TECHNIQUE: Multiplanar, multiecho pulse sequences of the brain and surrounding structures were obtained without intravenous contrast. COMPARISON:  Head CT 04/24/2014 FINDINGS: There is a small acute cortical and subcortical infarct in the posterior right frontal lobe involving the  precentral gyrus in the hand motor area. There is no evidence of intracranial hemorrhage, mass, midline shift, or extra-axial fluid collection. Ventricles and sulci are normal. There are mild periventricular and subcortical white matter T2 hyperintensities. Orbits are unremarkable. Mild mucosal thickening is noted in the ethmoid and maxillary sinuses bilaterally. The mastoid air cells are clear. Major intracranial vascular flow voids are preserved. IMPRESSION: 1. Small acute posterior right frontal lobe infarct involving the hand motor area. 2. Mild cerebral white matter disease, nonspecific but may reflect chronic small vessel ischemia. Electronically Signed   By: Logan Bores M.D.   On: 09/25/2015 16:41   LE dopplers   No evidence of deep or superficial vein thrombosis in both legs.   Carotid Doppler   There is 1-39% bilateral ICA stenosis. Vertebral artery flow is antegrade.    2D Echocardiogram  - Left ventricle: The cavity size was normal. Systolic function was  normal. The estimated ejection fraction was in the range of 55%  to 60%. Wall motion was normal; there were no regional wall  motion abnormalities. Doppler parameters are consistent with  abnormal left ventricular relaxation (grade 1 diastolic  dysfunction). There was no evidence of elevated ventricular  filling pressure by Doppler parameters. - Aortic valve: There was no regurgitation. - Mitral valve: Structurally normal valve. There was trivial  regurgitation. - Left atrium: The atrium was mildly dilated. - Right ventricle: Systolic function was normal. - Right atrium: The atrium was normal in size. - Tricuspid valve: There was mild regurgitation. - Pulmonic valve: There was no regurgitation. - Pulmonary arteries: Systolic pressure was within the normal  range. - Pericardium, extracardiac: The pericardium was normal in  appearance.  Impressions:  - No cardiac source of emboli was indentified.    PHYSICAL  EXAM Pleasant middle aged mildly obese Caucasian lady  not in distress. . Afebrile. Head is nontraumatic. Neck is supple without bruit.    Cardiac exam no murmur or gallop. Lungs are clear to auscultation. Distal pulses are well felt. Neurological Exam :  Awake alert oriented 3 with normal speech and language function. No aphasia or apraxia dysarthria. Pupils are equal reactive. Fundi were not visualized. Vision acuity and fields seem adequate. Face is symmetric without weakness. Tongue is midline. Motor system exam no upper or lower eczema to drift. Mild weakness of left grip and intrinsic hand muscles. Orbits right over left upper extremity. Findings in the movements are diminished on the left. Mild subjective left hemibody sensory loss to touch pinprick and vibration but splitting of the midline. The patient is a symmetric. Plantars are downgoing. Gait was not tested. ASSESSMENT/PLAN Ms. SHIRLEE WHITMIRE is a 55 y.o. female with history of prediabetes, obesity presenting with left sided sensory loss. She did not receive IV t-PA due to late presentation.   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 small vessel disease   MRA  ordered  Carotid Doppler  No significant stenosis   2D Echo  EF 55-60%. No source of embolus   LE dopplers negative   LDL 124  Hypercoagulation panel negative so far ( factor 5 leiden and prothrombin 2 mutation pending )   ANA, ESR normal TEE to look for embolic source. Have asked [facility] to schedule for an OP  . If TEE negative, a [place] electrophysiologist will consult and consider placement of an implantable loop recorder to evaluate for atrial fibrillation as etiology of stroke. This has been explained to patient/family by Dr. Leonie Man and they are agreeable.    HgbA1c 5.7  Lovenox for VTE prophylaxis Diet regular Room service appropriate?: Yes; Fluid consistency:: Thin  No antithrombotic prior to admission, now on aspirin 325 mg daily. Recommend taking with food   Patient counseled to be compliant with her antithrombotic medications  Ongoing aggressive stroke risk factor management  Therapy recommendations:  OP PT, OP OT  Disposition:  Return home  Hyperlipidemia  Home meds:  No statin  LDL 124, goal < 70  lipitor 80 mg already added  Continue statin at discharge  Pre-Diabetes  HgbA1c pending goal < 7.0  Other Stroke Risk Factors  ETOH use, advised to drink no more than 1 drink(s) a day  Obesity, Body mass index is 30.02 kg/(m^2)., recommend weight loss, diet and exercise as appropriate   Family hx stroke ( unknown as she is adiopted)  Hospital day # [place] for Pager information 09/27/2015 11:58 AM  I have personally examined this patient, reviewed notes, independently viewed imaging studies, participated in medical decision making and plan of care. I have made any additions or clarifications directly to the above note. Agree with note above.  She presented with left-sided numbness and proprioceptive difficulties secondary to right MCA branch infarct of embolic etiology with source to be determined. She is likely to need TEE and loop recorder insertion but this can be arranged as an outpatient   and she can be discharged. She appears quite anxious and frustrated with her situation. I recommend we give her a dose of Klonopin and advise her to resume her home dose. We will advise to keep her follow-up with her psychiatrist Dr. Chucky May this Friday. Greater than 50% of time during this 25 minute visit was spent on counseling and coordination of care about stroke risk, risk prevention and answering questions Antony Contras, MD Medical Director [place] Pager:  [phone] 09/27/2015 11:58 AM    To contact Stroke Continuity provider, please refer to [email or web address] After hours, contact General Neurology

## 2015-09-27 NOTE — Discharge Summary (Signed)
Physician Discharge Summary  Jenna Delgado [employer]:[id number] DOB: 1961-03-03 DOA: 09/25/2015  PCP: Gerrit Heck, MD  Admit date: 09/25/2015 Discharge date: 09/27/2015  Admitted From: Home Disposition:  Home  Recommendations for Outpatient Follow-up:  1. Follow up with PCP in 1-2 weeks 2. Please obtain BMP/CBC in one week   Home Health: No Equipment/Devices:none  Discharge Condition:Stable CODE STATUS: FULL Diet recommendation: Heart Healthy  Brief/Interim Summary: 55 year old female with a history of asthma, impaired glucose tolerance, depression/anxiety presented with one-day history of left upper and lower extremity numbness and "clumsy hand and leg" this started on the evening of 09/24/2015. The patient initially thought this may have been related to recent starting and stopping of her Celexa and Symbiax and Lamictal. However, when she woke up on the morning of June 20 50,017, her symptoms do not resolve. As a result she presented to the emergency department for further evaluation. MRI of the brain revealed a small acute right frontal infarct and associated small vessel disease. The patient was admitted for full stroke workup, and neurology was consulted.  Discharge Diagnoses:  Acute right frontal stroke -Neurology Consult appreciated -PT/OT evaluation-->outpt PT, OT -Speech therapy eval-->regular diet -MRI brain--small right acute frontal infarct -MRA brain-- NO No hemodynamically significant intracranial stenosis -Carotid Duplex--negative for hemodynamically significant stenosis -Echo-- EF 55-60 percent, grade 1 dd, no embolic source -ANV--916 -HbA1C--5.7 -Continue aspirin 325 mg daily -627/17--TEE planned outpatient-->loop recorder if negative -follow up ANA, hypercoagulable panel -ESR 3 - Lower extremity duplex negative for dvt - personally reviewed ekg-- sinus rhythm,no st-t wave changes -pt has some psychiatric overlay which creates some difficulty  clarifying her symptoms -09/27/15--discussed with neurology whom cleared pt for discharge  Impaired Glucose Tolerance -Hemoglobin A1c--5.7 -lifestyle modification  Depression/anxiety -Continue Celexa  Hyperlipidemia - LDL 124 -start lipitor  Asthma -stable on RA   Discharge Instructions     Medication List    STOP taking these medications        ibuprofen 200 MG tablet  Commonly known as:  ADVIL,MOTRIN      TAKE these medications        aspirin 325 MG tablet  Take 1 tablet (325 mg total) by mouth daily.     atorvastatin 80 MG tablet  Commonly known as:  LIPITOR  Take 1 tablet (80 mg total) by mouth daily at 6 PM.     citalopram 10 MG tablet  Commonly known as:  CELEXA  Take 10 mg by mouth daily.     clonazePAM 0.5 MG tablet  Commonly known as:  KLONOPIN  Take 0.5 mg by mouth daily as needed for anxiety.     esomeprazole 20 MG capsule  Commonly known as:  NEXIUM  Take 20 mg by mouth daily as needed (for heartburn or acid reflux).     OLANZapine-FLUoxetine 3-25 MG capsule  Commonly known as:  SYMBYAX  Take 1 capsule by mouth at bedtime.        Allergies  Allergen Reactions  . Benadryl [Diphenhydramine Hcl] Other (See Comments)    Heartpounding, panic attack  . Imitrex [Sumatriptan] Other (See Comments)    Heartpounding, panic attack  . Iodine Other (See Comments)    IVP test problems  . Ivp Dye [Iodinated Diagnostic Agents] Other (See Comments)    IVP test problems  . Lobster [Shellfish Allergy] Itching  . Lamictal [Lamotrigine] Rash  . Tetracyclines & Related Rash    Blisters down the throat  . Theophyllines Rash    Target rash  Consultations:  Neurology   Procedures/Studies: Dg Chest 2 View  09/26/2015  CLINICAL DATA:  Stroke.  History of asthma. EXAM: CHEST  2 VIEW COMPARISON:  Chest radiograph December 17, 2012 FINDINGS: The heart size and mediastinal contours are within normal limits. Mild bronchitic changes. Both lungs are  clear. The visualized skeletal structures are unremarkable. IMPRESSION: Mild bronchitic changes. Electronically Signed   By: Elon Alas M.D.   On: 09/26/2015 00:33   Mr Jodene Nam Head Wo Contrast  09/26/2015  CLINICAL DATA:  Continued surveillance of acute RIGHT frontal infarct. LEFT-sided sensory and motor symptoms. EXAM: MRA HEAD WITHOUT CONTRAST TECHNIQUE: Angiographic images of the Circle of Willis were obtained using MRA technique without intravenous contrast. COMPARISON:  MR brain 09/25/2015. FINDINGS: The internal carotid arteries are widely patent. The basilar artery is widely patent with vertebrals codominant. There is no intracranial stenosis or aneurysm. No MCA branch occlusion is evident. IMPRESSION: Unremarkable MRA of the intracranial circulation. Electronically Signed   By: Staci Righter M.D.   On: 09/26/2015 16:28   Mr Brain Wo Contrast  09/25/2015  CLINICAL DATA:  Left hand numbness and decreased grip strength beginning this morning. Left-sided numbness as well. EXAM: MRI HEAD WITHOUT CONTRAST TECHNIQUE: Multiplanar, multiecho pulse sequences of the brain and surrounding structures were obtained without intravenous contrast. COMPARISON:  Head CT 04/24/2014 FINDINGS: There is a small acute cortical and subcortical infarct in the posterior right frontal lobe involving the precentral gyrus in the hand motor area. There is no evidence of intracranial hemorrhage, mass, midline shift, or extra-axial fluid collection. Ventricles and sulci are normal. There are mild periventricular and subcortical white matter T2 hyperintensities. Orbits are unremarkable. Mild mucosal thickening is noted in the ethmoid and maxillary sinuses bilaterally. The mastoid air cells are clear. Major intracranial vascular flow voids are preserved. IMPRESSION: 1. Small acute posterior right frontal lobe infarct involving the hand motor area. 2. Mild cerebral white matter disease, nonspecific but may reflect chronic small  vessel ischemia. Electronically Signed   By: Logan Bores M.D.   On: 09/25/2015 16:41        Discharge Exam: Filed Vitals:   09/27/15 0607 09/27/15 0929  BP: 105/73 110/68  Pulse: 75 72  Temp: 97.8 F (36.6 C) 98.3 F (36.8 C)  Resp: 20 20   Filed Vitals:   09/26/15 2143 09/27/15 0207 09/27/15 0607 09/27/15 0929  BP: 103/65 129/79 105/73 110/68  Pulse: 85 75 75 72  Temp: 98.7 F (37.1 C) 97.8 F (36.6 C) 97.8 F (36.6 C) 98.3 F (36.8 C)  TempSrc: Oral Oral Oral Oral  Resp: _0 Height:      Weight:      SpO2: 100% 99% 100% 99%    General: Pt is alert, awake, not in acute distress Cardiovascular: RRR, S1/S2 +, no rubs, no gallops Respiratory: CTA bilaterally, no wheezing, no rhonchi Abdominal: Soft, NT, ND, bowel sounds + Extremities: no edema, no cyanosis   The results of significant diagnostics from this hospitalization (including imaging, microbiology, ancillary and laboratory) are listed below for reference.    Significant Diagnostic Studies: Dg Chest 2 View  09/26/2015  CLINICAL DATA:  Stroke.  History of asthma. EXAM: CHEST  2 VIEW COMPARISON:  Chest radiograph December 17, 2012 FINDINGS: The heart size and mediastinal contours are within normal limits. Mild bronchitic changes. Both lungs are clear. The visualized skeletal structures are unremarkable. IMPRESSION: Mild bronchitic changes. Electronically Signed   By: Thana Farr.D.  On: 09/26/2015 00:33   Mr Jodene Nam Head Wo Contrast  09/26/2015  CLINICAL DATA:  Continued surveillance of acute RIGHT frontal infarct. LEFT-sided sensory and motor symptoms. EXAM: MRA HEAD WITHOUT CONTRAST TECHNIQUE: Angiographic images of the Circle of Willis were obtained using MRA technique without intravenous contrast. COMPARISON:  MR brain 09/25/2015. FINDINGS: The internal carotid arteries are widely patent. The basilar artery is widely patent with vertebrals codominant. There is no intracranial stenosis or aneurysm.  No MCA branch occlusion is evident. IMPRESSION: Unremarkable MRA of the intracranial circulation. Electronically Signed   By: Staci Righter M.D.   On: 09/26/2015 16:28   Mr Brain Wo Contrast  09/25/2015  CLINICAL DATA:  Left hand numbness and decreased grip strength beginning this morning. Left-sided numbness as well. EXAM: MRI HEAD WITHOUT CONTRAST TECHNIQUE: Multiplanar, multiecho pulse sequences of the brain and surrounding structures were obtained without intravenous contrast. COMPARISON:  Head CT 04/24/2014 FINDINGS: There is a small acute cortical and subcortical infarct in the posterior right frontal lobe involving the precentral gyrus in the hand motor area. There is no evidence of intracranial hemorrhage, mass, midline shift, or extra-axial fluid collection. Ventricles and sulci are normal. There are mild periventricular and subcortical white matter T2 hyperintensities. Orbits are unremarkable. Mild mucosal thickening is noted in the ethmoid and maxillary sinuses bilaterally. The mastoid air cells are clear. Major intracranial vascular flow voids are preserved. IMPRESSION: 1. Small acute posterior right frontal lobe infarct involving the hand motor area. 2. Mild cerebral white matter disease, nonspecific but may reflect chronic small vessel ischemia. Electronically Signed   By: Logan Bores M.D.   On: 09/25/2015 16:41     Microbiology: No results found for this or any previous visit (from the past 240 hour(s)).   Labs: Basic Metabolic Panel:  Recent Labs Lab 09/25/15 1403 09/26/15 0225  NA 140 140  K 4.3 3.9  CL 108 109  CO2 25 25  GLUCOSE 110* 111*  BUN 14 14  CREATININE 0.79 0.80  CALCIUM 9.6 9.6   Liver Function Tests:  Recent Labs Lab 09/25/15 1403 09/26/15 0225  AST 29 25  ALT 47 43  ALKPHOS 64 53  BILITOT 0.8 0.9  PROT 7.1 6.5  ALBUMIN 4.0 3.6   No results for input(s): LIPASE, AMYLASE in the last 168 hours. No results for input(s): AMMONIA in the last 168  hours. CBC:  Recent Labs Lab 09/25/15 1403  WBC 5.5  NEUTROABS 2.7  HGB 13.1  HCT 40.2  MCV 92.0  PLT 197   Cardiac Enzymes: No results for input(s): CKTOTAL, CKMB, CKMBINDEX, TROPONINI in the last 168 hours. BNP: Invalid input(s): POCBNP CBG:  Recent Labs Lab 09/25/15 1717  GLUCAP 98    Time coordinating discharge:  Greater than 30 minutes  Signed:  Lacoya Wilbanks, DO [facility] Pager: [phone] 09/27/2015, 11:24 AM

## 2015-09-27 NOTE — Progress Notes (Signed)
RN discussed discharge instructions with patient and family including medications (stop ibuprofen), aware that prescriptions are at pharmacy, prescription for asa given to patient. Neuro assessment unchanged. Patient aware of appointments, home health was set up with CM. IV off, tele off. Neuro assessment unchanged.

## 2015-09-27 NOTE — Discharge Instructions (Signed)
Ischemic Stroke Treated Without Warfarin °An ischemic stroke (cerebrovascular accident) is the sudden death of brain tissue. It is a medical emergency. An ischemic stroke can cause permanent loss of brain function. This can cause problems with different parts of your body. °CAUSES °An ischemic stroke is caused by a decrease of oxygen supply to an area of your brain. It is usually the result of a small blood clot (embolus) or collection of cholesterol or fat (plaque) that blocks blood flow in the brain. An ischemic stroke can also be caused by blocked or damaged carotid arteries. °RISK FACTORS °· High blood pressure (hypertension). °· High cholesterol. °· Diabetes mellitus. °· Heart disease. °· The buildup of plaque in the blood vessels (peripheral artery disease or atherosclerosis). °· The buildup of plaque in the blood vessels that provide blood and oxygen to the brain (carotid artery stenosis). °· An abnormal heart rhythm (atrial fibrillation). °· Obesity. °· Smoking cigarettes. °· Taking oral contraceptives, especially in combination with using tobacco. °· Physical inactivity. °· A diet that is high in fats, salt (sodium), and calories. °· Excessive alcohol use. °· Use of illegal drugs, especially cocaine and methamphetamine. °· Being African American. °· Being over the age of 55 years. °· Family history of stroke. °· Previous history of blood clots, stroke, TIA (transient ischemic attack), or heart attack. °· Sickle cell disease. °SIGNS AND SYMPTOMS °These symptoms usually develop suddenly, or you may notice them after waking up from sleep. Symptoms may include sudden: °· Weakness or numbness in your face, arm, or leg, especially on one side of your body. °· Confusion. °· Trouble speaking (aphasia) or understanding speech. °· Trouble seeing with one or both eyes. °· Trouble walking or difficulty moving your arms or legs. °· Dizziness. °· Loss of balance or coordination. °· Severe headache with no known cause.  The headache is often described as the worst headache ever experienced. °DIAGNOSIS °Your health care provider can often determine the presence or absence of an ischemic stroke based on your symptoms, history, and physical exam. CT (computed tomography) of the brain is usually performed to confirm the stroke, determine causes, and determine stroke severity. Other tests may be done to find the cause of the stroke. These tests may include: °· ECG (electrocardiogram). °· Continuous heart monitoring. °· Echocardiogram. °· Carotid ultrasound. °· MRI. °· A scan of the brain circulation. °· Blood tests. °TREATMENT °It is very important to seek treatment at the first sign of stroke symptoms. Your health care provider may perform the following treatments within 6 hours of the onset of stroke symptoms: °· Medicine to dissolve the blood clot (thrombolytic). °· Inserting a device into the affected artery to remove the blood clot. °These treatments may not be effective if too much time has passed since your stroke symptoms began. Even if you do not know when your symptoms began, get treatment as soon as possible. There are other treatment options that may be given, such as: °· Oxygen. °· IV fluids. °· Medicines to thin the blood (anticoagulants). °· A procedure to widen blocked arteries. °Your treatment will depend on how long you have had your symptoms, the severity of your symptoms, and the cause of your symptoms. °Your health care provider will take measures to prevent short-term and long-term complications of stroke, such as: °· Breathing foreign material into the lungs (aspiration pneumonia). °· Blood clots in the legs. °· Bedsores. °· Falls. °Medicines and dietary changes may be used to help treat and manage risk factors for   stroke, such as diabetes and high blood pressure. °If any of your body's functions were impaired by stroke, you may work with physical, speech, or occupational therapists to help you recover. °HOME CARE  INSTRUCTIONS °· Take medicines only as directed by your health care provider. Follow the directions carefully. Medicines may be used to control risk factors for a stroke. Be sure that you understand all your medicine instructions. °· If swallow studies have determined that your swallowing reflex is present, you should eat healthy foods. Foods may need to be a soft or pureed consistency, or you may need to take small bites in order to avoid aspirating or choking. °· Follow physical activity guidelines as directed by your health care team. °· Do not use any tobacco products, including cigarettes, chewing tobacco, or electronic cigarettes. If you smoke, quit. If you need help quitting, ask your health care provider. °· Limit or stop alcohol use. °· A safe home environment is important to reduce the risk of falls. Your health care provider may arrange for specialists to evaluate your home. Having grab bars in the bedroom and bathroom is often important. Your health care provider may arrange for equipment to be used at home, such as raised toilets and a seat for the shower. °· Ongoing physical, occupational, and speech therapy may be needed to maximize your recovery after a stroke. If you have been advised to use a walker or a cane, use it at all times. Be sure to keep your therapy appointments. °· Keep all follow-up visits with your health care provider. This is very important. This includes any referrals, therapy, rehabilitation, and lab tests. Proper follow-up can prevent another stroke from occurring. °PREVENTION °The risk of a stroke can be decreased by appropriately treating high blood pressure, high cholesterol, diabetes, heart disease, and obesity. It can also be decreased by quitting smoking, limiting alcohol, and staying physically active. °SEEK IMMEDIATE MEDICAL CARE IF: °· You have sudden weakness or numbness in your face, arm, or leg, especially on one side of your body. °· You have sudden confusion. °· You  have sudden trouble speaking (aphasia) or understanding. °· You have sudden trouble seeing with one or both eyes. °· You have sudden trouble walking or difficulty moving your arms or legs. °· You have sudden dizziness. °· You have a sudden loss of balance or coordination. °· You have a sudden, severe headache with no known cause. °· You have a partial or total loss of consciousness. °Any of these symptoms may represent a serious problem that is an emergency. Do not wait to see if the symptoms will go away. Get medical help right away. Call your local emergency services (911 in [place] Do not drive yourself to the hospital. °  °This information is not intended to replace advice given to you by your health care provider. Make sure you discuss any questions you have with your health care provider. °  °Document Released: 01/01/2014 Document Reviewed: 01/01/2014 °Elsevier Interactive Patient Education ©2016 [employer] ° °

## 2015-09-27 NOTE — Progress Notes (Signed)
Occupational Therapy Treatment Patient Details Name: Jenna CountsKaren M Delgado MRN: [id number] DOB: Sep 22, 1960 Today's Date: 09/27/2015    History of present illness Jenna RistKaren Delgado is a 55 y.o. female, w Asthma, Glucose intolerance who presented with c/o L sided numbness. Left hand weakness as well. Started sometime yesterday nite. 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   OT comments  [name] admitted and found to have above presents to acute OT making gains with LUE use (with coins out of putty mainly did it with her left hand with only occasional help from RUE to hold putty), able to hold marker and keep other 3 digits out of way like a normal hold on a writing utensil. She does report that she feels her ambulating is more off today than yesterday and that the left side of her face feels like her arm and leg now---Dr. Tat walked into the room and I made him aware of this. Pt will continue to benefit from acute OT with follow up OPOT.  Follow Up Recommendations  Outpatient OT;Supervision/Assistance - 24 hour    Equipment Recommendations  None recommended by OT       Precautions / Restrictions Precautions Precautions: Fall Restrictions Weight Bearing Restrictions: No       Mobility Bed Mobility Overal bed mobility: Independent                Transfers Overall transfer level: Needs assistance   Transfers: Sit to/from Stand Sit to Stand: Supervision         General transfer comment: Pt min A at times for balance. reported that LLE feels heavier today and that her left knee feels like it is pointing out v. forward. She decided she needed to change shoes and that would help--it did somewhat but not 100%        ADL Overall ADL's : Needs assistance/impaired Eating/Feeding: Modified independent;Sitting (increased time to open containers due to decreased use of LUE)   Grooming: Oral care;Min guard;Standing (brushed teeth with LUE)           Upper Body  Dressing : Set up;Sitting   Lower Body Dressing: Set up;Supervision/safety;Sit to/from stand   Toilet Transfer: Minimal assistance;Ambulation Toilet Transfer Details (indicate cue type and reason): bed>to sink>out into hallway>down hallway>back to sit EOB                  Vision                 Additional Comments: Pt has Bil lazy eyes          Cognition   Behavior During Therapy:  (tearful) Overall Cognitive Status: Within Functional Limits for tasks assessed (increased time to recall birthday and date today)                         Exercises Other Exercises Other Exercises: Had pt work on cutting out shapes from paper (cutting with right hand and manipulating paper with left hand); getting coins out of theraputty with left hand; looping rubberbands together and then taking them apart; folding washcloth and towel  holdiing them in the air to do it; printing name, birthdate, and date on dry erase board with LUE           Pertinent Vitals/ Pain       Pain Assessment: No/denies pain         Frequency Min 3X/week     Progress Toward Goals  OT  Goals(current goals can now be found in the care plan section)  Progress towards OT goals: Progressing toward goals     Plan Discharge plan needs to be updated       End of Session Equipment Utilized During Treatment: Gait belt   Activity Tolerance Patient tolerated treatment well (other than more emotional today)   Patient Left in bed;with call bell/phone within reach   Nurse Communication          Time: 1021-1103 OT Time Calculation (min): 42 min  Charges: OT General Charges $OT Visit: 1 Procedure OT Treatments $Self Care/Home Management : 8-22 mins $Therapeutic Activity: 23-37 mins  Evette GeorgesLeonard, Jenna Delgado [phone] 09/27/2015, 12:33 PM

## 2015-09-27 NOTE — Care Management Note (Addendum)
Case Management Note  Patient Details  Name: Jenna Delgado MRN: [id number] Date of Birth: [birth year]  Subjective/Objective:                    Action/Plan: Patient discharging home with self care. PT/OT recommendations are for outpatient therapy, and Dr Tat in agreement. CM met with the patient and her husband and asked about [facility]. They were interested in attending at [facility]. Orders placed in EPIC and information on the AVS.   Addendum (1400): Dr Tat changed orders to [place] instead of outpatient therapy. CM met with the patient and her husband and provided them a list of [facility] agencies in the [place] area. They selected [facility]. [name] with [facility] notified and accepted the referral. Bedside RN updated. CM called and informed [facility] that patient would not be attending.   Expected Discharge Date:                  Expected Discharge Plan:  Home/Self Care  In-House Referral:     Discharge planning Services  CM Consult  Post Acute Care Choice:    Choice offered to:     DME Arranged:    DME Agency:     HH Arranged:    [place] Agency:     Status of Service:  Completed, signed off  If discussed at [employer] of Stay Meetings, dates discussed:    Additional Comments:  Pollie Friar, RN 09/27/2015, 12:32 PM

## 2015-09-28 LAB — CARDIOLIPIN ANTIBODIES, IGG, IGM, IGA: Anticardiolipin IgG: <9 GPL U/mL (ref 0–14)

## 2015-09-28 LAB — BETA-2-GLYCOPROTEIN I ABS, IGG/M/A

## 2015-09-29 ENCOUNTER — Inpatient Hospital Stay (HOSPITAL_COMMUNITY)
Admission: EM | Admit: 2015-09-29 | Discharge: 2015-09-30 | DRG: 057 | Disposition: A | Discharge status: Home / Self Care | Payer: BLUE CROSS/BLUE SHIELD | Attending: Internal Medicine | Admitting: Internal Medicine

## 2015-09-29 ENCOUNTER — Emergency Department (HOSPITAL_COMMUNITY): Payer: BLUE CROSS/BLUE SHIELD

## 2015-09-29 ENCOUNTER — Inpatient Hospital Stay (HOSPITAL_COMMUNITY): Payer: BLUE CROSS/BLUE SHIELD

## 2015-09-29 ENCOUNTER — Encounter (HOSPITAL_COMMUNITY): Payer: Self-pay | Admitting: *Deleted

## 2015-09-29 DIAGNOSIS — R7303 Prediabetes: Secondary | ICD-10-CM | POA: Diagnosis present

## 2015-09-29 DIAGNOSIS — I69354 Hemiplegia and hemiparesis following cerebral infarction affecting left non-dominant side: Principal | ICD-10-CM

## 2015-09-29 DIAGNOSIS — F319 Bipolar disorder, unspecified: Secondary | ICD-10-CM | POA: Diagnosis present

## 2015-09-29 DIAGNOSIS — Z91041 Radiographic dye allergy status: Secondary | ICD-10-CM

## 2015-09-29 DIAGNOSIS — R7989 Other specified abnormal findings of blood chemistry: Secondary | ICD-10-CM | POA: Diagnosis not present

## 2015-09-29 DIAGNOSIS — Z7982 Long term (current) use of aspirin: Secondary | ICD-10-CM | POA: Diagnosis not present

## 2015-09-29 DIAGNOSIS — J4520 Mild intermittent asthma, uncomplicated: Secondary | ICD-10-CM | POA: Diagnosis present

## 2015-09-29 DIAGNOSIS — I639 Cerebral infarction, unspecified: Secondary | ICD-10-CM | POA: Diagnosis not present

## 2015-09-29 DIAGNOSIS — I69328 Other speech and language deficits following cerebral infarction: Secondary | ICD-10-CM

## 2015-09-29 DIAGNOSIS — R739 Hyperglycemia, unspecified: Secondary | ICD-10-CM | POA: Diagnosis present

## 2015-09-29 DIAGNOSIS — E785 Hyperlipidemia, unspecified: Secondary | ICD-10-CM | POA: Diagnosis present

## 2015-09-29 DIAGNOSIS — E782 Mixed hyperlipidemia: Secondary | ICD-10-CM | POA: Diagnosis present

## 2015-09-29 DIAGNOSIS — Z888 Allergy status to other drugs, medicaments and biological substances status: Secondary | ICD-10-CM | POA: Diagnosis not present

## 2015-09-29 DIAGNOSIS — K7581 Nonalcoholic steatohepatitis (NASH): Secondary | ICD-10-CM | POA: Diagnosis present

## 2015-09-29 DIAGNOSIS — Z79899 Other long term (current) drug therapy: Secondary | ICD-10-CM

## 2015-09-29 DIAGNOSIS — R945 Abnormal results of liver function studies: Secondary | ICD-10-CM | POA: Diagnosis present

## 2015-09-29 DIAGNOSIS — J45909 Unspecified asthma, uncomplicated: Secondary | ICD-10-CM | POA: Diagnosis present

## 2015-09-29 LAB — DIFFERENTIAL
BASOS PCT: 1 %
Basophils Absolute: 0.1 10*3/uL (ref 0.0–0.1)
Eosinophils Absolute: 0.3 10*3/uL (ref 0.0–0.7)
Eosinophils Relative: 4 %
LYMPHS ABS: 2.1 10*3/uL (ref 0.7–4.0)
Lymphocytes Relative: 30 %
MONO ABS: 0.5 10*3/uL (ref 0.1–1.0)
MONOS PCT: 8 %
NEUTROS ABS: 4.2 10*3/uL (ref 1.7–7.7)
Neutrophils Relative %: 59 %

## 2015-09-29 LAB — APTT: aPTT: 30 seconds (ref 24–37)

## 2015-09-29 LAB — CBC
HEMATOCRIT: 41.1 % (ref 36.0–46.0)
HEMOGLOBIN: 13.6 g/dL (ref 12.0–15.0)
MCH: 31.1 pg (ref 26.0–34.0)
MCHC: 33.1 g/dL (ref 30.0–36.0)
MCV: 93.8 fL (ref 78.0–100.0)
Platelets: 221 10*3/uL (ref 150–400)
RBC: 4.38 MIL/uL (ref 3.87–5.11)
RDW: 13.6 % (ref 11.5–15.5)
WBC: 7.2 10*3/uL (ref 4.0–10.5)

## 2015-09-29 LAB — PROTIME-INR
INR: 1.02 (ref 0.00–1.49)
Prothrombin Time: 13.6 seconds (ref 11.6–15.2)

## 2015-09-29 LAB — COMPREHENSIVE METABOLIC PANEL
ALT: 60 U/L — AB (ref 14–54)
AST: 43 U/L — AB (ref 15–41)
Albumin: 4 g/dL (ref 3.5–5.0)
Alkaline Phosphatase: 65 U/L (ref 38–126)
Anion gap: 7 (ref 5–15)
BILIRUBIN TOTAL: 0.9 mg/dL (ref 0.3–1.2)
BUN: 12 mg/dL (ref 6–20)
CALCIUM: 9.6 mg/dL (ref 8.9–10.3)
CHLORIDE: 105 mmol/L (ref 101–111)
CO2: 27 mmol/L (ref 22–32)
CREATININE: 0.87 mg/dL (ref 0.44–1.00)
Glucose, Bld: 179 mg/dL — ABNORMAL HIGH (ref 65–99)
Potassium: 3.6 mmol/L (ref 3.5–5.1)
Sodium: 139 mmol/L (ref 135–145)
TOTAL PROTEIN: 7 g/dL (ref 6.5–8.1)

## 2015-09-29 LAB — I-STAT CHEM 8, ED
BUN: 13 mg/dL (ref 6–20)
CALCIUM ION: 1.17 mmol/L (ref 1.13–1.30)
CHLORIDE: 103 mmol/L (ref 101–111)
CREATININE: 0.8 mg/dL (ref 0.44–1.00)
GLUCOSE: 180 mg/dL — AB (ref 65–99)
HCT: 42 % (ref 36.0–46.0)
Hemoglobin: 14.3 g/dL (ref 12.0–15.0)
Potassium: 3.7 mmol/L (ref 3.5–5.1)
Sodium: 142 mmol/L (ref 135–145)
TCO2: 27 mmol/L (ref 0–100)

## 2015-09-29 LAB — ETHANOL: Alcohol, Ethyl (B): <5 mg/dL (ref <5)

## 2015-09-29 LAB — I-STAT TROPONIN, ED: TROPONIN I, POC: 0 ng/mL (ref 0.00–0.08)

## 2015-09-29 MED ORDER — ASPIRIN EC 325 MG PO TBEC
325.0000 mg | DELAYED_RELEASE_TABLET | Freq: Every day | ORAL | Status: DC
  Administered 2015-09-29 – 2015-09-30 (×2): 325 mg via ORAL
  Filled 2015-09-29 (×2): qty 1

## 2015-09-29 MED ORDER — ENOXAPARIN SODIUM 40 MG/0.4ML ~~LOC~~ SOLN
40.0000 mg | SUBCUTANEOUS | Status: DC
Start: 2015-09-29 — End: 2015-09-30
  Administered 2015-09-29: 40 mg via SUBCUTANEOUS
  Filled 2015-09-29: qty 0.4

## 2015-09-29 MED ORDER — SODIUM CHLORIDE 0.9% FLUSH
3.0000 mL | Freq: Two times a day (BID) | INTRAVENOUS | Status: DC
  Administered 2015-09-29 – 2015-09-30 (×2): 3 mL via INTRAVENOUS

## 2015-09-29 MED ORDER — ONDANSETRON HCL 4 MG PO TABS: 4.0000 mg | ORAL_TABLET | Freq: Four times a day (QID) | ORAL | Status: DC | PRN

## 2015-09-29 MED ORDER — PANTOPRAZOLE SODIUM 40 MG PO TBEC
40.0000 mg | DELAYED_RELEASE_TABLET | Freq: Every day | ORAL | Status: DC
  Administered 2015-09-30: 40 mg via ORAL
  Filled 2015-09-29: qty 1

## 2015-09-29 MED ORDER — STROKE: EARLY STAGES OF RECOVERY BOOK
Freq: Once | Status: AC
  Administered 2015-09-29: 1
  Filled 2015-09-29: qty 1

## 2015-09-29 MED ORDER — OLANZAPINE-FLUOXETINE HCL 3-25 MG PO CAPS
1.0000 | ORAL_CAPSULE | Freq: Every day | ORAL | Status: DC
  Administered 2015-09-29: 1 via ORAL
  Filled 2015-09-29 (×2): qty 1

## 2015-09-29 MED ORDER — ONDANSETRON HCL 4 MG/2ML IJ SOLN: 4.0000 mg | Freq: Four times a day (QID) | INTRAMUSCULAR | Status: DC | PRN

## 2015-09-29 MED ORDER — ALBUTEROL SULFATE (2.5 MG/3ML) 0.083% IN NEBU: 3.0000 mL | INHALATION_SOLUTION | Freq: Four times a day (QID) | RESPIRATORY_TRACT | Status: DC | PRN

## 2015-09-29 MED ORDER — CLONAZEPAM 0.5 MG PO TABS
0.5000 mg | ORAL_TABLET | Freq: Two times a day (BID) | ORAL | Status: DC | PRN
  Administered 2015-09-29: 0.5 mg via ORAL
  Filled 2015-09-29: qty 1

## 2015-09-29 NOTE — Consult Note (Signed)
Chief Complaint: code stroke  History obtained from:  Patient     HPI:                                                                                                                                         Jenna Delgado is an 55 y.o. female with recent stroke on 09/27/15 with similar symptoms of L sided arm and leg weakness but today presents with worsening symptoms, world finding difficulty and more pronounced leg weakness. No TPA given due to hx of recent stroke. Currently on ASA. CTH negative  Date last known well: 09/29/15 Time last known well: 1330 tPA Given: No: due to hx of recent stroke   Past Medical History  Diagnosis Date  . Asthma   . Prediabetes   . Seasonal allergic rhinitis   . Mild bipolar disorder [facility](HCC)     Past Surgical History  Procedure Laterality Date  . Abdominal hysterectomy    . Bladder suspension      Family History  Problem Relation Age of Onset  . Adopted: Yes   Social History:  reports that she has never smoked. She has never used smokeless tobacco. She reports that she drinks about 0.6 oz of alcohol per week. She reports that she does not use illicit drugs.  Allergies:  Allergies  Allergen Reactions  . Benadryl [Diphenhydramine Hcl] Other (See Comments)    Heartpounding, panic attack  . Imitrex [Sumatriptan] Other (See Comments)    Heartpounding, panic attack  . Iodine Other (See Comments)    IVP test problems  . Ivp Dye [Iodinated Diagnostic Agents] Other (See Comments)    IVP test problems  . Lobster [Shellfish Allergy] Itching  . Lamictal [Lamotrigine] Rash  . Tetracyclines & Related Rash    Blisters down the throat  . Theophyllines Rash    Target rash    Medications:                                                                                                                           I have reviewed the patient's current medications.  ROS:  History obtained from chart review  General ROS: negative for - chills, fatigue, fever, night sweats, weight gain or weight loss Psychological ROS: negative for - behavioral disorder, hallucinations, memory difficulties, mood swings or suicidal ideation Ophthalmic ROS: negative for - blurry vision, double vision, eye pain or loss of vision ENT ROS: negative for - epistaxis, nasal discharge, oral lesions, sore throat, tinnitus or vertigo Allergy and Immunology ROS: negative for - hives or itchy/watery eyes Hematological and Lymphatic ROS: negative for - bleeding problems, bruising or swollen lymph nodes Endocrine ROS: negative for - galactorrhea, hair pattern changes, polydipsia/polyuria or temperature intolerance Respiratory ROS: negative for - cough, hemoptysis, shortness of breath or wheezing Cardiovascular ROS: negative for - chest pain, dyspnea on exertion, edema or irregular heartbeat Gastrointestinal ROS: negative for - abdominal pain, diarrhea, hematemesis, nausea/vomiting or stool incontinence Genito-Urinary ROS: negative for - dysuria, hematuria, incontinence or urinary frequency/urgency Musculoskeletal ROS: negative for - joint swelling or muscular weakness Neurological ROS: as noted in HPI Dermatological ROS: negative for rash and skin lesion changes  Neurologic Examination:                                                                                                      Blood pressure 119/80, pulse 86, temperature 98.3 F (36.8 C), temperature source Oral, resp. rate 12, SpO2 97 %.  HEENT-  Normocephalic, no lesions, without obvious abnormality.  Normal external eye and conjunctiva.  Normal TM's bilaterally.  Normal auditory canals and external ears. Normal external nose, mucus membranes and septum.  Normal pharynx. Cardiovascular- regular rate and rhythm, S1, S2 normal, no murmur, click, rub or gallop, pulses palpable  throughout   Lungs- chest clear, no wheezing, rales, normal symmetric air entry, Heart exam - S1, S2 normal, no murmur, no gallop, rate regular Abdomen- soft, non-tender; bowel sounds normal; no masses,  no organomegaly   Neurological Examination Mental Status: Alert, oriented, thought content appropriate.  Speech fluent without evidence of aphasia.  Able to follow 3 step commands without difficulty. Cranial Nerves: II:  Visual fields grossly normal, pupils equal, round, reactive to light and accommodation III,IV, VI: ptosis not present, extra-ocular motions intact bilaterally V,VII: smile symmetric, facial light touch sensation abnormal on L VIII: hearing normal bilaterally IX,X: uvula rises symmetrically XI: bilateral shoulder shrug XII: midline tongue extension Motor: Right : Upper extremity   5/5    Left:     Upper extremity   4/5 +drift  Lower extremity   5/5     Lower extremity   4/5 Tone and bulk:normal tone throughout; no atrophy noted Sensory: Pinprick and light touch diminished on L   Cerebellar: normal finger-to-nose,       Lab Results: Basic Metabolic Panel:  Recent Labs Lab 09/25/15 1403 09/26/15 0225  NA 140 140  K 4.3 3.9  CL 108 109  CO2 25 25  GLUCOSE 110* 111*  BUN 14 14  CREATININE 0.79 0.80  CALCIUM 9.6 9.6    Liver Function Tests:  Recent Labs Lab 09/25/15 1403 09/26/15 0225  AST 29 25  ALT 47 43  ALKPHOS 64 53  BILITOT 0.8 0.9  PROT 7.1 6.5  ALBUMIN 4.0 3.6   No results for input(s): LIPASE, AMYLASE in the last 168 hours. No results for input(s): AMMONIA in the last 168 hours.  CBC:  Recent Labs Lab 09/25/15 1403  WBC 5.5  NEUTROABS 2.7  HGB 13.1  HCT 40.2  MCV 92.0  PLT 197    Cardiac Enzymes: No results for input(s): CKTOTAL, CKMB, CKMBINDEX, TROPONINI in the last 168 hours.  Lipid Panel:  Recent Labs Lab 09/26/15 0225  CHOL 202*  TRIG 134  HDL 51  CHOLHDL 4.0  VLDL 27  LDLCALC 161124*    CBG:  Recent  Labs Lab 09/25/15 1717  GLUCAP 98    Microbiology: No results found for this or any previous visit.  Coagulation Studies: No results for input(s): LABPROT, INR in the last 72 hours.  Imaging: Ct Head Wo Contrast  09/29/2015  CLINICAL DATA:  Slurred speech.  History of strokes. EXAM: CT HEAD WITHOUT CONTRAST TECHNIQUE: Contiguous axial images were obtained from the base of the skull through the vertex without intravenous contrast. COMPARISON:  MR brain 09/25/2015 FINDINGS: There is no evidence of mass effect, midline shift or extra-axial fluid collections. There is no evidence of a space-occupying lesion or intracranial hemorrhage. There is no evidence of a cortical-based area of acute infarction. Small area of low attenuation in the right posterior frontal lobe consistent with a small subacute infarct. The ventricles and sulci are appropriate for the patient's age. The basal cisterns are patent. Visualized portions of the orbits are unremarkable. The visualized portions of the paranasal sinuses and mastoid air cells are unremarkable. The osseous structures are unremarkable. IMPRESSION: 1. Small stable right posterior frontal lobe subacute infarct without hemorrhagic transformation. 2. No new area of infarction. Critical Value/emergent results were called by telephone at the time of interpretation on 09/29/2015 at 4:48 pm to Dr. Cherylynn RidgesShikhman, who verbally acknowledged these results. Electronically Signed   By: Elige KoHetal  Patel   On: 09/29/2015 16:45         Assessment: 55 y.o. female with recent stroke on 09/27/15 with similar symptoms of L sided arm and leg weakness but today presents with worsening symptoms, world finding difficulty and more pronounced leg weakness. No TPA given due to hx of recent stroke. Currently on ASA. CTH negative  -Admit - Would recommend stroke limited MRI brain without contrast - Based on MRI results would depend further workup considering she just had a full stroke work up  days ago

## 2015-09-29 NOTE — ED Provider Notes (Signed)
CSN: [id number]     Arrival date & time 09/29/15  1610 History   First MD Initiated [id number] with Patient 09/29/15 1620     Chief Complaint  Patient presents with  . Weakness  . Speech Problem    @EDPCLEARED @ (Consider location/radiation/quality/duration/timing/severity/associated sxs/prior Treatment) The history is provided by the patient.  55 year old female just recently discharged from the hospital for stroke with residual left arm left leg weakness. Patient was admitted for that on June 25. Acute ischemic stroke. The patient was a primary care office today being evaluated when suddenly she had speech problems and felt a funny sensation to her left side of her face. And some difficulty swallowing. Primary care noted that her speech seemed to be slurred and she was searching to get words out a little bit. Patient feels it may be of left arm left leg weakness residual from the previous stroke may be a little bit worse. Patient sent in by EMS from primary care office. Patient denies headache.  Past Medical History  Diagnosis Date  . Asthma   . Prediabetes   . Seasonal allergic rhinitis   . Mild bipolar disorder [facility](HCC)    Past Surgical History  Procedure Laterality Date  . Abdominal hysterectomy    . Bladder suspension     Family History  Problem Relation Age of Onset  . Adopted: Yes   Social History  Substance Use Topics  . Smoking status: Never Smoker   . Smokeless tobacco: Never Used  . Alcohol Use: 0.6 oz/week    1 Standard drinks or equivalent per week     Comment: occasionally   OB History    No data available     Review of Systems  Constitutional: Negative for fever.  HENT: Negative for congestion.   Eyes: Negative for visual disturbance.  Respiratory: Negative for shortness of breath.   Cardiovascular: Negative for chest pain.  Gastrointestinal: Negative for abdominal pain.  Musculoskeletal: Negative for back pain and neck pain.  Skin: Negative for rash.   Neurological: Positive for speech difficulty and weakness. Negative for dizziness and headaches.  Hematological: Does not bruise/bleed easily.  Psychiatric/Behavioral: Negative for confusion.      Allergies  Benadryl; Imitrex; Iodine; Ivp dye; Lobster; Lamictal; Tetracyclines & related; and Theophyllines  Home Medications   Prior to Admission medications   Medication Sig Start Date End Date Taking? Authorizing Provider  aspirin 325 MG tablet Take 1 tablet (325 mg total) by mouth daily. 09/27/15  Yes Catarina Hartshornavid Tat, MD  atorvastatin (LIPITOR) 80 MG tablet Take 1 tablet (80 mg total) by mouth daily at 6 PM. 09/27/15  Yes Catarina Hartshornavid Tat, MD  clonazePAM (KLONOPIN) 0.5 MG tablet Take 0.5 mg by mouth daily as needed for anxiety.  06/24/15  Yes Historical Provider, MD  esomeprazole (NEXIUM) 20 MG capsule Take 20 mg by mouth daily as needed (for heartburn or acid reflux).  06/24/15  Yes Historical Provider, MD  OLANZapine-FLUoxetine (SYMBYAX) 3-25 MG capsule Take 1 capsule by mouth at bedtime. 09/20/15  Yes Historical Provider, MD  citalopram (CELEXA) 10 MG tablet Take 10 mg by mouth daily. Reported on 09/29/2015    Historical Provider, MD   BP 96/63 mmHg  Pulse 71  Temp(Src) 98.3 F (36.8 C) (Oral)  Resp 14  SpO2 96% Physical Exam  Constitutional: She is oriented to person, place, and time. She appears well-developed and well-nourished. No distress.  HENT:  Head: Normocephalic and atraumatic.  Mouth/Throat: Oropharynx is clear and moist.  Eyes: Conjunctivae  and EOM are normal. Pupils are equal, round, and reactive to light.  Neck: Normal range of motion. Neck supple.  Cardiovascular: Normal rate, regular rhythm and normal heart sounds.   Pulmonary/Chest: Effort normal and breath sounds normal. No respiratory distress.  Abdominal: Soft. Bowel sounds are normal. There is no tenderness.  Musculoskeletal:  Weakness to left arm left leg.  Neurological: She is alert and oriented to person, place, and  time.  Slurring of speech and a little bit of word finding. Patient with weakness to left arm and left leg that she states may be slightly worse.  Skin: Skin is warm. No rash noted.  Nursing note and vitals reviewed.   ED Course  Procedures (including critical care time) Labs Review Labs Reviewed  COMPREHENSIVE METABOLIC PANEL - Abnormal; Notable for the following:    Glucose, Bld 179 (*)    AST 43 (*)    ALT 60 (*)    All other components within normal limits  I-STAT CHEM 8, ED - Abnormal; Notable for the following:    Glucose, Bld 180 (*)    All other components within normal limits  ETHANOL  PROTIME-INR  APTT  CBC  DIFFERENTIAL  URINE RAPID DRUG SCREEN, HOSP PERFORMED  URINALYSIS, ROUTINE W REFLEX MICROSCOPIC (NOT AT [facility])  I-STAT TROPOININ, ED   Results for orders placed or performed during the hospital encounter of 09/29/15  Ethanol  Result Value Ref Range   Alcohol, Ethyl (B) <5 <5 mg/dL  Protime-INR  Result Value Ref Range   Prothrombin Time 13.6 11.6 - 15.2 seconds   INR 1.02 0.00 - 1.49  APTT  Result Value Ref Range   aPTT 30 24 - 37 seconds  CBC  Result Value Ref Range   WBC 7.2 4.0 - 10.5 K/uL   RBC 4.38 3.87 - 5.11 MIL/uL   Hemoglobin 13.6 12.0 - 15.0 g/dL   HCT 96.0 45.4 - 09.8 %   MCV 93.8 78.0 - 100.0 fL   MCH 31.1 26.0 - 34.0 pg   MCHC 33.1 30.0 - 36.0 g/dL   RDW 11.9 14.7 - 82.9 %   Platelets 221 150 - 400 K/uL  Differential  Result Value Ref Range   Neutrophils Relative % 59 %   Neutro Abs 4.2 1.7 - 7.7 K/uL   Lymphocytes Relative 30 %   Lymphs Abs 2.1 0.7 - 4.0 K/uL   Monocytes Relative 8 %   Monocytes Absolute 0.5 0.1 - 1.0 K/uL   Eosinophils Relative 4 %   Eosinophils Absolute 0.3 0.0 - 0.7 K/uL   Basophils Relative 1 %   Basophils Absolute 0.1 0.0 - 0.1 K/uL  Comprehensive metabolic panel  Result Value Ref Range   Sodium 139 135 - 145 mmol/L   Potassium 3.6 3.5 - 5.1 mmol/L   Chloride 105 101 - 111 mmol/L   CO2 27 22 - 32 mmol/L    Glucose, Bld 179 (H) 65 - 99 mg/dL   BUN 12 6 - 20 mg/dL   Creatinine, Ser 5.62 0.44 - 1.00 mg/dL   Calcium 9.6 8.9 - 13.0 mg/dL   Total Protein 7.0 6.5 - 8.1 g/dL   Albumin 4.0 3.5 - 5.0 g/dL   AST 43 (H) 15 - 41 U/L   ALT 60 (H) 14 - 54 U/L   Alkaline Phosphatase 65 38 - 126 U/L   Total Bilirubin 0.9 0.3 - 1.2 mg/dL   GFR calc non Af Amer >60 >60 mL/min   GFR calc Af Amer >  60 >60 mL/min   Anion gap 7 5 - 15  I-Stat Chem 8, ED  (not [facility])  Result Value Ref Range   Sodium 142 135 - 145 mmol/L   Potassium 3.7 3.5 - 5.1 mmol/L   Chloride 103 101 - 111 mmol/L   BUN 13 6 - 20 mg/dL   Creatinine, Ser 1.190.80 0.44 - 1.00 mg/dL   Glucose, Bld 147180 (H) 65 - 99 mg/dL   Calcium, Ion 8.291.17 5.621.13 - 1.30 mmol/L   TCO2 27 0 - 100 mmol/L   Hemoglobin 14.3 12.0 - 15.0 g/dL   HCT 13.042.0 86.536.0 - 78.446.0 %  I-stat troponin, ED ([facility])  Result Value Ref Range   Troponin i, poc 0.00 0.00 - 0.08 ng/mL   Comment 3             Imaging Review Ct Head Wo Contrast  09/29/2015  CLINICAL DATA:  Slurred speech.  History of strokes. EXAM: CT HEAD WITHOUT CONTRAST TECHNIQUE: Contiguous axial images were obtained from the base of the skull through the vertex without intravenous contrast. COMPARISON:  MR brain 09/25/2015 FINDINGS: There is no evidence of mass effect, midline shift or extra-axial fluid collections. There is no evidence of a space-occupying lesion or intracranial hemorrhage. There is no evidence of a cortical-based area of acute infarction. Small area of low attenuation in the right posterior frontal lobe consistent with a small subacute infarct. The ventricles and sulci are appropriate for the patient's age. The basal cisterns are patent. Visualized portions of the orbits are unremarkable. The visualized portions of the paranasal sinuses and mastoid air cells are unremarkable. The osseous structures are unremarkable. IMPRESSION: 1. Small stable right posterior frontal lobe subacute infarct  without hemorrhagic transformation. 2. No new area of infarction. Critical Value/emergent results were called by telephone at the time of interpretation on 09/29/2015 at 4:48 pm to Dr. Cherylynn RidgesShikhman, who verbally acknowledged these results. Electronically Signed   By: Elige KoHetal  Patel   On: 09/29/2015 16:45   I have personally reviewed and evaluated these images and lab results as part of my medical decision-making.   EKG Interpretation   Date/Time:  Thursday September 29 2015 16:42:11 EDT Ventricular Rate:  82 PR Interval:    QRS Duration: 85 QT Interval:  398 QTC Calculation: 465 R Axis:   -6 Text Interpretation:  Sinus rhythm No significant change since last  tracing Confirmed by Gracin Soohoo  MD, Preethi Scantlebury [phone]([id number]) on 09/29/2015 4:53:35 PM       CRITICAL CARE Performed by: Vanetta MuldersZACKOWSKI,Aloysuis Ribaudo Total critical care time: 30 minutes Critical care time was exclusive of separately billable procedures and treating other patients. Critical care was necessary to treat or prevent imminent or life-threatening deterioration. Critical care was time spent personally by me on the following activities: development of treatment plan with patient and/or surrogate as well as nursing, discussions with consultants, evaluation of patient's response to treatment, examination of patient, obtaining history from patient or surrogate, ordering and performing treatments and interventions, ordering and review of laboratory studies, ordering and review of radiographic studies, pulse oximetry and re-evaluation of patient's condition.     MDM   Final diagnoses:  Cerebral infarction due to unspecified mechanism    Patient just recently discharged for CVA with left-sided the leg and arm weakness. Patient was a primary care physician's office today at around 2:30 she started with speech problems and felt as if there was numbness were strange sensation to the left side of her face  and left side of upper lip as well as some swallowing  difficulties. Patient brought in by EMS from primary care office for this. Upon arrival I assessed her felt that that was consistent with new stroke symptoms and call the code stroke.  Patient evaluated by stroke team. Head CT without any acute findings. Showed evidence of the old right posterior frontal lobe subacute infarct. MRI ordered to evaluate today's new symptoms. Stroke team recommended the internal medicine admission. They have been contacted and patient will be admitted.  Patient remained stable here. Patient's speech problems worse or if some word searching and also some slurring patient noticed a change herself as well.    Vanetta Mulders, MD 09/29/15 [phone]

## 2015-09-29 NOTE — ED Notes (Signed)
Pt arrives from [facility]CP via [facility]. Pt was seen today for a c/u following a CVA on Sunday. Pt has lingering left sided deficits from her stroke that worsened today at 1315 and once she told her PCP 911 was dispatched. Pt states she feels like she is slurring her speech and that it is harder to get out words and find what she wants to say. Pt states she also has increased numbness to the left side of face and weakness to the left leg.

## 2015-09-29 NOTE — ED Notes (Signed)
Called MRI to have transport team take pt to [facility]. Husband taken to [facility] waiting room.

## 2015-09-29 NOTE — H&P (Signed)
History and Physical    Jenna Delgado ZOX:[id number] DOB: 26-Feb-1961 DOA: 09/29/2015  PCP: Gaye Alken, MD   Patient coming from: Doctor's office.  Chief Complaint: Slurred speech.   HPI: Jenna Delgado is a 55 y.o. female with medical history significant of asthma, prediabetes, seasonal allergic rhinitis, bipolar disorder who was recently admitted and discharge to the hospital due to acute ischemic CVA and returns to the hospital after developing slurred speech while she was being evaluated by her PCP.  Per patient, she was feeling tired earlier in the afternoon. She says that she had lunch around 1300 and then went to her doctor's office. At around 1430, while being evaluated at the doctor's office, her physician noticed that the patient's speech became significantly slurred over the course of a few minutes. Subsequently, EMS was called, the ER was given report on her transfer and code stroke was activated. Her speech, per patient's husband, continues to be slurred like "if she would have had 3 or 4 beers." The patient also continues to have left-sided hemiparesis, but does not appreciate any significant worsening, except for her left facial area, which she describes as a feeling of having a swollen face. She also complains of photophobia, finding words difficulty, but denies headache, dizziness, chest pain, palpitations, nausea or emesis.   During my interview with the patient and her husband, she was also noticed to have a dry cough. Physical exam revealed mild wheezing. She denies fever, chills, but feels fatigued. She has a history of mild intermittent asthma.  ED Course: Patient was evaluated and code stroke activated. She was in no acute distress while awaiting for MRI imaging when seen.  Review of Systems: As per HPI otherwise 10 point review of systems negative.   Past Medical History  Diagnosis Date  . Asthma   . Prediabetes   . Seasonal allergic rhinitis   .  Mild bipolar disorder [facility])     Past Surgical History  Procedure Laterality Date  . Abdominal hysterectomy    . Bladder suspension       reports that she has never smoked. She has never used smokeless tobacco. She reports that she drinks about 0.6 oz of alcohol per week. She reports that she does not use illicit drugs.  Allergies  Allergen Reactions  . Benadryl [Diphenhydramine Hcl] Other (See Comments)    Heartpounding, panic attack  . Imitrex [Sumatriptan] Other (See Comments)    Heartpounding, panic attack  . Iodine Other (See Comments)    IVP test problems  . Ivp Dye [Iodinated Diagnostic Agents] Other (See Comments)    IVP test problems  . Lobster [Shellfish Allergy] Itching  . Lamictal [Lamotrigine] Rash  . Tetracyclines & Related Rash    Blisters down the throat  . Theophyllines Rash    Target rash    Family History  Problem Relation Age of Onset  . Adopted: Yes    Prior to Admission medications   Medication Sig Start Date End Date Taking? Authorizing Provider  aspirin 325 MG tablet Take 1 tablet (325 mg total) by mouth daily. 09/27/15  Yes Catarina Hartshorn, MD  atorvastatin (LIPITOR) 80 MG tablet Take 1 tablet (80 mg total) by mouth daily at 6 PM. 09/27/15  Yes Catarina Hartshorn, MD  clonazePAM (KLONOPIN) 0.5 MG tablet Take 0.5 mg by mouth daily as needed for anxiety.  06/24/15  Yes Historical Provider, MD  esomeprazole (NEXIUM) 20 MG capsule Take 20 mg by mouth daily as needed (for heartburn  or acid reflux).  06/24/15  Yes Historical Provider, MD  OLANZapine-FLUoxetine (SYMBYAX) 3-25 MG capsule Take 1 capsule by mouth at bedtime. 09/20/15  Yes Historical Provider, MD  citalopram (CELEXA) 10 MG tablet Take 10 mg by mouth daily. Reported on 09/29/2015    Historical Provider, MD    Physical Exam: Filed Vitals:   09/29/15 1700 09/29/15 1730 09/29/15 1800 09/29/15 1815  BP: 119/80 128/81 115/58 96/63  Pulse: 86 88 76 71  Temp:      TempSrc:      Resp: 12 15 13 14   SpO2: 97% 97% 96%  96%      Constitutional: NAD, calm, comfortable Filed Vitals:   09/29/15 1700 09/29/15 1730 09/29/15 1800 09/29/15 1815  BP: 119/80 128/81 115/58 96/63  Pulse: 86 88 76 71  Temp:      TempSrc:      Resp: 12 15 13 14   SpO2: 97% 97% 96% 96%   Eyes: PERRL, lids and conjunctivae normal ENMT: Mucous membranes are moist. Posterior pharynx clear of any exudate or lesions. Neck: normal, supple, no masses, no thyromegaly Respiratory: Subtle wheezing bilaterally, no crackles. Normal respiratory effort.                      No accessory muscle use.  Cardiovascular: Regular rate and rhythm, no murmurs / rubs / gallops. No extremity edema. 2+ pedal pulses. No carotid bruits.  Abdomen: no tenderness, no masses palpated. No hepatosplenomegaly. Bowel sounds positive.  Musculoskeletal: no clubbing / cyanosis. No joint deformity upper and lower extremities. Good ROM, no contractures. Normal muscle tone.  Skin: no rashes, lesions, ulcers. No induration Neurologic: Mildly slurred speech, Left sided decreased sensation, particularly on Left facial area, DTR normal. 4.5/5 left sided hemiparesis. LUE pronator drift.  Psychiatric: Normal judgment and insight. Alert and oriented x 4. Normal mood.     Labs on Admission: I have personally reviewed following labs and imaging studies  CBC:  Recent Labs Lab 09/25/15 1403 09/29/15 1655 09/29/15 1656  WBC 5.5  --  7.2  NEUTROABS 2.7  --  4.2  HGB 13.1 14.3 13.6  HCT 40.2 42.0 41.1  MCV 92.0  --  93.8  PLT 197  --  221   Basic Metabolic Panel:  Recent Labs Lab 09/25/15 1403 09/26/15 0225 09/29/15 1655 09/29/15 1656  NA 140 140 142 139  K 4.3 3.9 3.7 3.6  CL 108 109 103 105  CO2 25 25  --  27  GLUCOSE 110* 111* 180* 179*  BUN 14 14 13 12   CREATININE 0.79 0.80 0.80 0.87  CALCIUM 9.6 9.6  --  9.6   GFR: Estimated Creatinine Clearance: 73.6 mL/min (by C-G formula based on Cr of 0.87). Liver Function Tests:  Recent Labs Lab  09/25/15 1403 09/26/15 0225 09/29/15 1656  AST 29 25 43*  ALT 47 43 60*  ALKPHOS 64 53 65  BILITOT 0.8 0.9 0.9  PROT 7.1 6.5 7.0  ALBUMIN 4.0 3.6 4.0   Coagulation Profile:  Recent Labs Lab 09/29/15 1656  INR 1.02   CBG:  Recent Labs Lab 09/25/15 1717  GLUCAP 98   Urine analysis:    Component Value Date/Time   COLORURINE YELLOW 04/24/2014 1435   APPEARANCEUR CLOUDY* 04/24/2014 1435   LABSPEC 1.019 04/24/2014 1435   PHURINE 5.0 04/24/2014 1435   GLUCOSEU NEGATIVE 04/24/2014 1435   HGBUR NEGATIVE 04/24/2014 1435   BILIRUBINUR NEGATIVE 04/24/2014 1435   KETONESUR NEGATIVE 04/24/2014 1435   PROTEINUR  NEGATIVE 04/24/2014 1435   UROBILINOGEN 0.2 04/24/2014 1435   NITRITE NEGATIVE 04/24/2014 1435   LEUKOCYTESUR NEGATIVE 04/24/2014 1435    Radiological Exams on Admission: Ct Head Wo Contrast  09/29/2015  CLINICAL DATA:  Slurred speech.  History of strokes. EXAM: CT HEAD WITHOUT CONTRAST TECHNIQUE: Contiguous axial images were obtained from the base of the skull through the vertex without intravenous contrast. COMPARISON:  MR brain 09/25/2015 FINDINGS: There is no evidence of mass effect, midline shift or extra-axial fluid collections. There is no evidence of a space-occupying lesion or intracranial hemorrhage. There is no evidence of a cortical-based area of acute infarction. Small area of low attenuation in the right posterior frontal lobe consistent with a small subacute infarct. The ventricles and sulci are appropriate for the patient's age. The basal cisterns are patent. Visualized portions of the orbits are unremarkable. The visualized portions of the paranasal sinuses and mastoid air cells are unremarkable. The osseous structures are unremarkable. IMPRESSION: 1. Small stable right posterior frontal lobe subacute infarct without hemorrhagic transformation. 2. No new area of infarction. Critical Value/emergent results were called by telephone at the time of interpretation on  09/29/2015 at 4:48 pm to Dr. Cherylynn RidgesShikhman, who verbally acknowledged these results. Electronically Signed   By: Elige KoHetal  Patel   On: 09/29/2015 16:45    EKG: Independently reviewed. Vent. rate 82 BPM PR interval * ms QRS duration 85 ms QT/QTc 398/465 ms P-R-T axes 51 -6 49 Sinus rhythm No significant change since last EKG.  Assessment/Plan Principal Problem:   Acute CVA (cerebrovascular accident) (HCC) Admit to telemetry/inpatient. Frequent neuro checks. Check MRI of brain to evaluate for new or worsening of existing infarcts. Patient swallow evaluation. PT/OT evaluation in the morning. Neurology is on consult. Stroke workup just recently done.  Active Problems:   Hyperglycemia Hemoglobin A1C was 5.7%    Hyperlipidemia Resume Atorvastatin if the patient tolerates swallow tests.     Abnormal liver function test Suspect NASH in the presence of glucose intolerance vs high-dose Atorvastatin use. Check RUQ US and Hepatitis Panel.    Asthma Albuterol MDI as needed.     Bipolar disorder (HCC) Resume Symbax once she passes swallow test.    DVT prophylaxis: Lovenox. Code Status: Full. Family Communication: Her husband was present in the room. Disposition Plan: Admit for further evaluation and treatment. Consults called: Neuro hospitalist service . Admission status: Inpatient/Telemetry.    Bobette Moavid Manuel Ortiz MD [facility] Pager [phone].  If 7PM-7AM, please contact night-coverage [email or web address] Password [email or web address]  09/29/2015, 7:09 PM

## 2015-09-30 ENCOUNTER — Inpatient Hospital Stay (HOSPITAL_COMMUNITY): Payer: BLUE CROSS/BLUE SHIELD

## 2015-09-30 ENCOUNTER — Other Ambulatory Visit: Payer: Self-pay | Admitting: Physician Assistant

## 2015-09-30 DIAGNOSIS — R945 Abnormal results of liver function studies: Secondary | ICD-10-CM | POA: Insufficient documentation

## 2015-09-30 DIAGNOSIS — I639 Cerebral infarction, unspecified: Secondary | ICD-10-CM | POA: Insufficient documentation

## 2015-09-30 DIAGNOSIS — E785 Hyperlipidemia, unspecified: Secondary | ICD-10-CM

## 2015-09-30 DIAGNOSIS — R7989 Other specified abnormal findings of blood chemistry: Secondary | ICD-10-CM

## 2015-09-30 LAB — COMPREHENSIVE METABOLIC PANEL
ALBUMIN: 3.5 g/dL (ref 3.5–5.0)
ALK PHOS: 56 U/L (ref 38–126)
ALT: 58 U/L — ABNORMAL HIGH (ref 14–54)
ANION GAP: 6 (ref 5–15)
AST: 40 U/L (ref 15–41)
BUN: 12 mg/dL (ref 6–20)
CALCIUM: 9.1 mg/dL (ref 8.9–10.3)
CHLORIDE: 111 mmol/L (ref 101–111)
CO2: 25 mmol/L (ref 22–32)
Creatinine, Ser: 0.84 mg/dL (ref 0.44–1.00)
GFR calc non Af Amer: >60 mL/min (ref >60)
GLUCOSE: 105 mg/dL — AB (ref 65–99)
POTASSIUM: 4.2 mmol/L (ref 3.5–5.1)
SODIUM: 142 mmol/L (ref 135–145)
Total Bilirubin: 0.9 mg/dL (ref 0.3–1.2)
Total Protein: 6.3 g/dL — ABNORMAL LOW (ref 6.5–8.1)

## 2015-09-30 LAB — CBC WITH DIFFERENTIAL/PLATELET
BASOS PCT: 1 %
Basophils Absolute: 0.1 10*3/uL (ref 0.0–0.1)
EOS ABS: 0.3 10*3/uL (ref 0.0–0.7)
EOS PCT: 6 %
HCT: 40.1 % (ref 36.0–46.0)
HEMOGLOBIN: 13.1 g/dL (ref 12.0–15.0)
LYMPHS ABS: 2.1 10*3/uL (ref 0.7–4.0)
LYMPHS PCT: 40 %
MCH: 31 pg (ref 26.0–34.0)
MCHC: 32.7 g/dL (ref 30.0–36.0)
MCV: 94.8 fL (ref 78.0–100.0)
MONO ABS: 0.4 10*3/uL (ref 0.1–1.0)
MONOS PCT: 8 %
NEUTROS ABS: 2.4 10*3/uL (ref 1.7–7.7)
NEUTROS PCT: 45 %
PLATELETS: 199 10*3/uL (ref 150–400)
RBC: 4.23 MIL/uL (ref 3.87–5.11)
RDW: 13.9 % (ref 11.5–15.5)
WBC: 5.3 10*3/uL (ref 4.0–10.5)

## 2015-09-30 LAB — LIPID PANEL
CHOLESTEROL: 150 mg/dL (ref 0–200)
HDL: 51 mg/dL (ref >40)
LDL Cholesterol: 77 mg/dL (ref 0–99)
TRIGLYCERIDES: 111 mg/dL (ref <150)
Total CHOL/HDL Ratio: 2.9 RATIO
VLDL: 22 mg/dL (ref 0–40)

## 2015-09-30 NOTE — Discharge Summary (Signed)
Physician Discharge Summary  Jenna Delgado DIY:[id number] DOB: [birth year]  PCP: Gerrit Heck, MD  Admit date: 09/29/2015 Discharge date: 09/30/2015  Admitted From: Home Disposition:  Home  Recommendations for Outpatient Follow-up:  1. Dr. Leighton Ruff, PCP in one week with repeat labs (LFTs). Please follow acute hepatitis panel and A1c that were sent from the hospital. 2. Dr. Chucky May, Psychiatry: patient advised to reschedule appointment that she missed today. 3. Dr. Antony Contras, Neurology in 2 months.  Home Health: Out patient SLP. Equipment/Devices: None    Discharge Condition: Improved and stable  CODE STATUS: Full  Diet recommendation: Heart healthy diet  Brief/Interim Summary: 55 year old female with PMH of asthma, prediabetes, seasonal allergic rhinitis, bipolar disorder, recent hospitalization 09/25/15-09/27/15 for acute right frontal stroke and underwent complete evaluation (TEE +/- loop recorder were to be pursued as outpatient) now presented to the ED on 09/29/15 with transient worsening of left sided weakness. No TPA given due to history of recent stroke. He was admitted for further evaluation and management. Telemetry: Sinus rhythm. Neurology was consulted. They indicated that her slurred speech and left-sided weakness was secondary to right frontal lobe infarct earlier in the week. No new stroke. Stroke felt to be embolic secondary to unknown source. As per neurology, MRI brain showed normal evolution off small right posterior frontal cortical and subcortical infarct without new stroke. Hypercoagulation panel negative so far (factor V Leyden and prothrombin 2 mutation remain pending). ANA and ESR normal. Discussed with cardiology who had scheduled her for TEE and are aware of need for loop recorder if TEE is normal. A1c 5.7 recently. LDL 124 and on Lipitor 80 mg daily. Patient was on aspirin 325 MG daily prior to this admission and was advised to continue same.  Stroke service did not recommend any further inpatient workup. PT and OT did not see any home needs. Speech therapy recommended outpatient SLP which was arranged by case management. Discussed with the stroke team.  Hyperlipidemia: LDL 124, goal <70. Recently started on Lipitor 80 MG daily. Only minimal elevation of AST (43) and ALT (60) and hence will continue same and patient advised to follow-up with PCP in a week's time with repeat LFTs. Repeat LDL in the hospital 77.  Glucose intolerance: A1c 5.7. Lifestyle modifications advised.  Bipolar disorder: Patient had an appointment to see her psychiatrist at 3 PM today which she could not keep because of hospitalization. She states that she was recently taken off of medication because of side effects. Gives a family history of bipolar disorder and her son and grandson. Appears stable for outpatient follow-up with psychiatry.  Minimally abnormal LFTs - No GI symptoms. RUQ ultrasound: Fatty liver. Improved. Recently started on statins. Follow LFTs as outpatient.    Discharge Diagnoses:  Principal Problem:   Acute CVA (cerebrovascular accident) ([place]) Active Problems:   Hyperglycemia   Hyperlipidemia   Abnormal liver function test   Asthma   Bipolar disorder [facility])    Discharge Instructions      Discharge Instructions    Ambulatory referral to Occupational Therapy    Complete by:  As directed      Ambulatory referral to Speech Therapy    Complete by:  As directed      Call MD for:    Complete by:  As directed   Strokelike symptoms.     Diet - low sodium heart healthy    Complete by:  As directed      Increase activity slowly  Complete by:  As directed             Medication List    STOP taking these medications        citalopram 10 MG tablet  Commonly known as:  CELEXA      TAKE these medications        aspirin 325 MG tablet  Take 1 tablet (325 mg total) by mouth daily.     atorvastatin 80 MG tablet  Commonly  known as:  LIPITOR  Take 1 tablet (80 mg total) by mouth daily at 6 PM.     clonazePAM 0.5 MG tablet  Commonly known as:  KLONOPIN  Take 0.5 mg by mouth daily as needed for anxiety.     esomeprazole 20 MG capsule  Commonly known as:  NEXIUM  Take 20 mg by mouth daily as needed (for heartburn or acid reflux).     OLANZapine-FLUoxetine 3-25 MG capsule  Commonly known as:  SYMBYAX  Take 1 capsule by mouth at bedtime.       Follow-up Information    Follow up with Gerrit Heck, MD. Schedule an appointment as soon as possible for a visit in 1 week.   Specialty:  Family Medicine   Why:  To be seen with repeat labs (LFTs).   Contact information:   [place] [address] [phone]       Schedule an appointment as soon as possible for a visit with Derrel Nip, MD.   Specialty:  Psychiatry   Contact information:   [place] [address] [place] [address] [phone]       Follow up with SETHI,PRAMOD, MD. Schedule an appointment as soon as possible for a visit in 2 months.   Specialties:  Neurology, Radiology   Contact information:   [address] [place] [address] [phone]       Follow up with [facility].   Specialty:  Rehabilitation   Why:  They will contact you to set up the first appointment.    Contact information:   [address] [place] [id number] [place] [phone]      Follow up with [facility].   Why:  Please present to [facility] hospital on Monday for TEE, please arrive by 10AM for outpatient TEE scheduled for 12PM. No food or drink in AM of study except sips of water with meds   Contact information:   [address] [id number] [place] [address] [phone]     Allergies  Allergen Reactions  . Benadryl [Diphenhydramine Hcl] Other  (See Comments)    Heartpounding, panic attack  . Imitrex [Sumatriptan] Other (See Comments)    Heartpounding, panic attack  . Iodine Other (See Comments)    IVP test problems  . Ivp Dye [Iodinated Diagnostic Agents] Other (See Comments)    IVP test problems  . Lobster [Shellfish Allergy] Itching  . Lamictal [Lamotrigine] Rash  . Tetracyclines & Related Rash    Blisters down the throat  . Theophyllines Rash    Target rash    Consultations:  Neurology   Procedures/Studies: Dg Chest 2 View  09/26/2015  CLINICAL DATA:  Stroke.  History of asthma. EXAM: CHEST  2 VIEW COMPARISON:  Chest radiograph December 17, 2012 FINDINGS: The heart size and mediastinal contours are within normal limits. Mild bronchitic changes. Both lungs are clear. The visualized skeletal structures are  unremarkable. IMPRESSION: Mild bronchitic changes. Electronically Signed   By: Elon Alas M.D.   On: 09/26/2015 00:33   Ct Head Wo Contrast  09/29/2015  CLINICAL DATA:  Slurred speech.  History of strokes. EXAM: CT HEAD WITHOUT CONTRAST TECHNIQUE: Contiguous axial images were obtained from the base of the skull through the vertex without intravenous contrast. COMPARISON:  MR brain 09/25/2015 FINDINGS: There is no evidence of mass effect, midline shift or extra-axial fluid collections. There is no evidence of a space-occupying lesion or intracranial hemorrhage. There is no evidence of a cortical-based area of acute infarction. Small area of low attenuation in the right posterior frontal lobe consistent with a small subacute infarct. The ventricles and sulci are appropriate for the patient's age. The basal cisterns are patent. Visualized portions of the orbits are unremarkable. The visualized portions of the paranasal sinuses and mastoid air cells are unremarkable. The osseous structures are unremarkable. IMPRESSION: 1. Small stable right posterior frontal lobe subacute infarct without hemorrhagic transformation. 2. No  new area of infarction. Critical Value/emergent results were called by telephone at the time of interpretation on 09/29/2015 at 4:48 pm to Dr. Cristobal Goldmann, who verbally acknowledged these results. Electronically Signed   By: Kathreen Devoid   On: 09/29/2015 16:45   Mr Jodene Nam Head Wo Contrast  09/26/2015  CLINICAL DATA:  Continued surveillance of acute RIGHT frontal infarct. LEFT-sided sensory and motor symptoms. EXAM: MRA HEAD WITHOUT CONTRAST TECHNIQUE: Angiographic images of the Circle of Willis were obtained using MRA technique without intravenous contrast. COMPARISON:  MR brain 09/25/2015. FINDINGS: The internal carotid arteries are widely patent. The basilar artery is widely patent with vertebrals codominant. There is no intracranial stenosis or aneurysm. No MCA branch occlusion is evident. IMPRESSION: Unremarkable MRA of the intracranial circulation. Electronically Signed   By: Staci Righter M.D.   On: 09/26/2015 16:28   Mr Brain Wo Contrast  09/29/2015  CLINICAL DATA:  Worsening deficits from previously documented stroke. EXAM: MRI HEAD WITHOUT CONTRAST TECHNIQUE: Multiplanar, multiecho pulse sequences of the brain and surrounding structures were obtained without intravenous contrast. COMPARISON:  MRI brain 09/25/2015.  CT head earlier today. FINDINGS: The distribution of acute infarction noted previously in similar, slightly larger, with increased intensity of signal on diffusion-weighted imaging. Suspect slight extension of the previously identified acute infarct. Within limits for detection on MR, no hemorrhagic transformation. Evolving cytotoxic edema. No new areas of infarction. No visible large vessel or major MCA branch occlusion. Normal for age cerebral volume. Mild subcortical and periventricular T2 and FLAIR hyperintensities, likely chronic microvascular ischemic change. Pituitary, pineal, and cerebellar tonsils unremarkable. No upper cervical lesions. Flow voids are preserved. Extracranial soft tissues  grossly unremarkable. IMPRESSION: Small acute RIGHT posterior frontal cortical and subcortical infarction, slightly increased in size from 09/25/2015, also with increased intensity of signal on diffusion-weighted imaging, consistent with the clinical symptoms of worsening deficit. No hemorrhagic transformation. Otherwise stable scan. Electronically Signed   By: Staci Righter M.D.   On: 09/29/2015 20:36   Mr Brain Wo Contrast  09/25/2015  CLINICAL DATA:  Left hand numbness and decreased grip strength beginning this morning. Left-sided numbness as well. EXAM: MRI HEAD WITHOUT CONTRAST TECHNIQUE: Multiplanar, multiecho pulse sequences of the brain and surrounding structures were obtained without intravenous contrast. COMPARISON:  Head CT 04/24/2014 FINDINGS: There is a small acute cortical and subcortical infarct in the posterior right frontal lobe involving the precentral gyrus in the hand motor area. There is no evidence of intracranial hemorrhage, mass, midline shift,  or extra-axial fluid collection. Ventricles and sulci are normal. There are mild periventricular and subcortical white matter T2 hyperintensities. Orbits are unremarkable. Mild mucosal thickening is noted in the ethmoid and maxillary sinuses bilaterally. The mastoid air cells are clear. Major intracranial vascular flow voids are preserved. IMPRESSION: 1. Small acute posterior right frontal lobe infarct involving the hand motor area. 2. Mild cerebral white matter disease, nonspecific but may reflect chronic small vessel ischemia. Electronically Signed   By: Logan Bores M.D.   On: 09/25/2015 16:41   US Abdomen Limited Ruq  09/30/2015  CLINICAL DATA:  Elevated liver function studies EXAM: US ABDOMEN LIMITED - RIGHT UPPER QUADRANT COMPARISON:  Abdominal ultrasound of January 11 2006 FINDINGS: Gallbladder: The gallbladder is adequately distended. A previously described gallbladder polyp all along the anterior mucosal surface is no longer evident. No  stones or sludge are observed. There is no wall thickening or sonographic Murphy's sign. Common bile duct: Diameter: 4.2 mm Liver: The hepatic echotexture is mildly increased diffusely. There is no focal mass nor ductal dilation. IMPRESSION: No evidence of gallstones nor other acute gallbladder abnormality. If there are clinical concerns of gallbladder dysfunction, a nuclear medicine hepatobiliary scan may be useful. Increased hepatic echotexture is consistent with fatty infiltrative change. Electronically Signed   By: David  Martinique M.D.   On: 09/30/2015 11:26      Subjective: Seen this morning. Stated that she felt better with improvement in weakness on her left side. Seen ambulating steadily with physical therapy in the halls.  Discharge Exam:  Filed Vitals:   09/30/15 0430 09/30/15 0630 09/30/15 0830 09/30/15 0924  BP: 117/62 102/69 103/72 100/74  Pulse: 70 60 76 65  Temp: 97.9 F (36.6 C) 97.5 F (36.4 C) 97.7 F (36.5 C) 98.6 F (37 C)  TempSrc: Oral Oral Oral Oral  Resp: '18 18 18 20  ' SpO2: 99% 98% 97% 98%    General: Pt lying comfortably in bed & appears in no obvious distress. Cardiovascular: S1 & S2 heard, RRR, S1/S2 +. No murmurs, rubs, gallops or clicks. No JVD or pedal edema. Telemetry: Sinus rhythm. Respiratory: Clear to auscultation without wheezing, rhonchi or crackles. No increased work of breathing. Abdominal:  Non distended, non tender & soft. No organomegaly or masses appreciated. Normal bowel sounds heard. CNS: Alert and oriented. No dysarthria, aphasia or facial asymmetry. Extremities: no edema, no cyanosis. Left pronator drift. Slightly decreased left hand grip.    The results of significant diagnostics from this hospitalization (including imaging, microbiology, ancillary and laboratory) are listed below for reference.     Microbiology: No results found for this or any previous visit (from the past 240 hour(s)).   Labs: BNP (last 3 results) No results for  input(s): BNP in the last 8760 hours. Basic Metabolic Panel:  Recent Labs Lab 09/25/15 1403 09/26/15 0225 09/29/15 1655 09/29/15 1656 09/30/15 0820  NA 140 140 142 139 142  K 4.3 3.9 3.7 3.6 4.2  CL 108 109 103 105 111  CO2 25 25  --  27 25  GLUCOSE 110* 111* 180* 179* 105*  BUN '14 14 13 12 12  ' CREATININE 0.79 0.80 0.80 0.87 0.84  CALCIUM 9.6 9.6  --  9.6 9.1   Liver Function Tests:  Recent Labs Lab 09/25/15 1403 09/26/15 0225 09/29/15 1656 09/30/15 0820  AST 29 25 43* 40  ALT 47 43 60* 58*  ALKPHOS 64 53 65 56  BILITOT 0.8 0.9 0.9 0.9  PROT 7.1 6.5 7.0 6.3*  ALBUMIN 4.0 3.6 4.0 3.5   No results for input(s): LIPASE, AMYLASE in the last 168 hours. No results for input(s): AMMONIA in the last 168 hours. CBC:  Recent Labs Lab 09/25/15 1403 09/29/15 1655 09/29/15 1656 09/30/15 0820  WBC 5.5  --  7.2 5.3  NEUTROABS 2.7  --  4.2 2.4  HGB 13.1 14.3 13.6 13.1  HCT 40.2 42.0 41.1 40.1  MCV 92.0  --  93.8 94.8  PLT 197  --  221 199   Cardiac Enzymes: No results for input(s): CKTOTAL, CKMB, CKMBINDEX, TROPONINI in the last 168 hours. BNP: Invalid input(s): POCBNP CBG:  Recent Labs Lab 09/25/15 1717  GLUCAP 98   D-Dimer No results for input(s): DDIMER in the last 72 hours. Hgb A1c No results for input(s): HGBA1C in the last 72 hours. Lipid Profile  Recent Labs  09/30/15 0820  CHOL 150  HDL 51  LDLCALC 77  TRIG 111  CHOLHDL 2.9   Thyroid function studies No results for input(s): TSH, T4TOTAL, T3FREE, THYROIDAB in the last 72 hours.  Invalid input(s): FREET3 Anemia work up No results for input(s): VITAMINB12, FOLATE, FERRITIN, TIBC, IRON, RETICCTPCT in the last 72 hours.    Time coordinating discharge: Over 30 minutes  SIGNED:  Vernell Leep, MD, FACP, [place]. [facility] Pager [phone]  If 7PM-7AM, please contact night-coverage [email or web address] Password [facility] 09/30/2015, 6:14 PM

## 2015-09-30 NOTE — Progress Notes (Addendum)
    [facility] has been requested to perform a transesophageal echocardiogram on 09/30/2015 for stroke.  After careful review of history and examination, the risks and benefits of transesophageal echocardiogram have been explained including risks of esophageal damage, perforation (1:10,000 risk), bleeding, pharyngeal hematoma as well as other potential complications associated with conscious sedation including aspiration, arrhythmia, respiratory failure and death. Alternatives to treatment were discussed, questions were answered. Patient is willing to proceed.   55 yo female with recent CVA came back with worsening symptom, no new CVA noted. Plan for outpatient TEE on Monday. Vital stable. Hgb stable and platelet normal.   Azalee CourseHao Zakeya Junker, PA-C 09/30/2015 4:57 PM    Note if TEE is negative, will plan for outpatient loop record. Will inform EP team  Signed, Azalee CourseHao Eduin Friedel PA Pager: [phone]

## 2015-09-30 NOTE — Progress Notes (Signed)
STROKE TEAM PROGRESS NOTE   HISTORY OF PRESENT ILLNESS (per record) Jenna Delgado is an 55 y.o. female with recent stroke on 09/27/15 with similar symptoms of L sided arm and leg weakness but today presents with worsening symptoms, world finding difficulty and more pronounced leg weakness. No TPA given due to hx of recent stroke. Currently on ASA. CTH negative. She was last known well 09/29/15 at 1330. No tPA Given due to hx of recent stroke,    SUBJECTIVE (INTERVAL HISTORY) There is a friend at the bedside. More friends arrived during rounds.    OBJECTIVE Temp:  [97.5 F (36.4 C)-98.6 F (37 C)] 98.6 F (37 C) (06/30 0924) Pulse Rate:  [60-100] 65 (06/30 0924) Cardiac Rhythm:  [-] Normal sinus rhythm (06/30 0700) Resp:  [12-20] 20 (06/30 0924) BP: (96-140)/(56-81) 100/74 mmHg (06/30 0924) SpO2:  [96 %-99 %] 98 % (06/30 0924) FiO2 (%):  [21 %] 21 % (06/29 2040) Weight:  [79.379 kg (175 lb)] 79.379 kg (175 lb) (06/29 2007)  CBC:   Recent Labs Lab 09/29/15 1656 09/30/15 0820  WBC 7.2 5.3  NEUTROABS 4.2 2.4  HGB 13.6 13.1  HCT 41.1 40.1  MCV 93.8 94.8  PLT 221 349    Basic Metabolic Panel:   Recent Labs Lab 09/29/15 1656 09/30/15 0820  NA 139 142  K 3.6 4.2  CL 105 111  CO2 27 25  GLUCOSE 179* 105*  BUN 12 12  CREATININE 0.87 0.84  CALCIUM 9.6 9.1    Lipid Panel:     Component Value Date/Time   CHOL 150 09/30/2015 0820   TRIG 111 09/30/2015 0820   HDL 51 09/30/2015 0820   CHOLHDL 2.9 09/30/2015 0820   VLDL 22 09/30/2015 0820   LDLCALC 77 09/30/2015 0820   HgbA1c:  Lab Results  Component Value Date   HGBA1C 5.7* 09/26/2015   Urine Drug Screen:     Component Value Date/Time   LABOPIA NONE DETECTED 09/26/2015 1635   COCAINSCRNUR NONE DETECTED 09/26/2015 1635   LABBENZ NONE DETECTED 09/26/2015 1635   AMPHETMU NONE DETECTED 09/26/2015 1635   THCU NONE DETECTED 09/26/2015 1635   LABBARB NONE DETECTED 09/26/2015 1635      IMAGING  Ct Head Wo  Contrast  09/29/2015  CLINICAL DATA:  Slurred speech.  History of strokes. EXAM: CT HEAD WITHOUT CONTRAST TECHNIQUE: Contiguous axial images were obtained from the base of the skull through the vertex without intravenous contrast. COMPARISON:  MR brain 09/25/2015 FINDINGS: There is no evidence of mass effect, midline shift or extra-axial fluid collections. There is no evidence of a space-occupying lesion or intracranial hemorrhage. There is no evidence of a cortical-based area of acute infarction. Small area of low attenuation in the right posterior frontal lobe consistent with a small subacute infarct. The ventricles and sulci are appropriate for the patient's age. The basal cisterns are patent. Visualized portions of the orbits are unremarkable. The visualized portions of the paranasal sinuses and mastoid air cells are unremarkable. The osseous structures are unremarkable. IMPRESSION: 1. Small stable right posterior frontal lobe subacute infarct without hemorrhagic transformation. 2. No new area of infarction. Critical Value/emergent results were called by telephone at the time of interpretation on 09/29/2015 at 4:48 pm to Dr. Cristobal Goldmann, who verbally acknowledged these results. Electronically Signed   By: Kathreen Devoid   On: 09/29/2015 16:45   Mr Brain Wo Contrast  09/29/2015  CLINICAL DATA:  Worsening deficits from previously documented stroke. EXAM: MRI HEAD WITHOUT CONTRAST TECHNIQUE:  Multiplanar, multiecho pulse sequences of the brain and surrounding structures were obtained without intravenous contrast. COMPARISON:  MRI brain 09/25/2015.  CT head earlier today. FINDINGS: The distribution of acute infarction noted previously in similar, slightly larger, with increased intensity of signal on diffusion-weighted imaging. Suspect slight extension of the previously identified acute infarct. Within limits for detection on MR, no hemorrhagic transformation. Evolving cytotoxic edema. No new areas of infarction. No  visible large vessel or major MCA branch occlusion. Normal for age cerebral volume. Mild subcortical and periventricular T2 and FLAIR hyperintensities, likely chronic microvascular ischemic change. Pituitary, pineal, and cerebellar tonsils unremarkable. No upper cervical lesions. Flow voids are preserved. Extracranial soft tissues grossly unremarkable. IMPRESSION: Small acute RIGHT posterior frontal cortical and subcortical infarction, slightly increased in size from 09/25/2015, also with increased intensity of signal on diffusion-weighted imaging, consistent with the clinical symptoms of worsening deficit. No hemorrhagic transformation. Otherwise stable scan. Electronically Signed   By: John T Curnes M.D.   On: 09/29/2015 20:36   Us Abdomen Limited Ruq  09/30/2015  CLINICAL DATA:  Elevated liver function studies EXAM: US ABDOMEN LIMITED - RIGHT UPPER QUADRANT COMPARISON:  Abdominal ultrasound of January 11 2006 FINDINGS: Gallbladder: The gallbladder is adequately distended. A previously described gallbladder polyp all along the anterior mucosal surface is no longer evident. No stones or sludge are observed. There is no wall thickening or sonographic Murphy's sign. Common bile duct: Diameter: 4.2 mm Liver: The hepatic echotexture is mildly increased diffusely. There is no focal mass nor ductal dilation. IMPRESSION: No evidence of gallstones nor other acute gallbladder abnormality. If there are clinical concerns of gallbladder dysfunction, a nuclear medicine hepatobiliary scan may be useful. Increased hepatic echotexture is consistent with fatty infiltrative change. Electronically Signed   By: David  Jordan M.D.   On: 09/30/2015 11:26    PHYSICAL EXAM Pleasant middle aged mildly obese Caucasian lady  not in distress. . Afebrile. Head is nontraumatic. Neck is supple without bruit.    Cardiac exam no murmur or gallop. Lungs are clear to auscultation. Distal pulses are well felt. Neurological Exam :  Awake  alert oriented 3 with normal speech and language function. No aphasia or apraxia dysarthria. Pupils are equal reactive. Fundi were not visualized. Vision acuity and fields seem adequate. Face is symmetric without weakness. Tongue is midline. Motor system exam no upper or lower eczema to drift. Mild weakness of left grip and intrinsic hand muscles. Orbits right over left upper extremity. Findings in the movements are diminished on the left. Mild subjective left hemibody sensory loss to touch pinprick and vibration but splitting of the midline. The patient is a symmetric. Plantars are downgoing. Gait was not tested.   ASSESSMENT/PLAN [name] is a 55 y.o. female with history of prediabetes, obesity who was here for stroke 09/27/15 presenting with worsening of slurred speech.  She was not considered for IV t-PA due to recent stroke.  Slurred speech secondary to right frontal lobe infarct earlier in the week. NO NEW stroke. Stroke felt to be embolic secondary to unknown source  Resultant  Mild left sided sensory and proprioceptive difficulty  MRI  Normal evolution of small R posterior frontal cortical and subcortical infarct. No new stroke  Hypercoagulation panel negative so far ( factor 5 leiden and prothrombin 2 mutation remain pending )   ANA, ESR normal  TEE and loop previously requested as an OP - awaiting cardiology to schedule. Note that Monday's schedule is full, likely due to holiday on   Tues. Have requested follow up. Ok to continue with plan as an OP  HgbA1c 5.7  Lovenox for VTE prophylaxis Diet heart healthy/carb modified Room service appropriate?: Yes; Fluid consistency:: Thin  aspirin 325 mg daily prior to admission, continued aspirin 325 mg daily. Recommend continuation.  Patient counseled to be compliant with her antithrombotic medications  Ongoing aggressive stroke risk factor management  Therapy recommendations:  Continue OP PT, OP OT  Disposition:  Return  home  Hyperlipidemia  Home meds: on lipitor 80  LDL 124, goal < 70  Continue statin at discharge  Pre-Diabetes  HgbA1c 5.7 goal < 7.0  Other Stroke Risk Factors  ETOH use, advised to drink no more than 1 drink(s) a day  Obesity, There is no weight on file to calculate BMI., recommend weight loss, diet and exercise as appropriate   Family hx stroke ( unknown as she is adiopted)  Hospital day # [place] for Pager information 09/30/2015 12:53 PM  I have personally examined this patient, reviewed notes, independently viewed imaging studies, participated in medical decision making and plan of care. I have made any additions or clarifications directly to the above note. Agree with note above. She presented with transient worsening of her recent stroke deficits of left-sided weakness and numbness and MRI shows expected evolutionary changes in the right brain infarct without any new medication infarct. Recommend no further stroke workup in house but arrange for outpatient TEE followed by cardiac monitoring. Continue aspirin and statin and follow-up with me as an outpatient in 2 months. Greater than 50% time during this 25 minute visit was spent on counseling and coordination of care about stroke risk, prevention and treatment  Antony Contras, MD Medical Director [place] Pager: [phone] 09/30/2015 4:33 PM  To contact Stroke Continuity provider, please refer to [email or web address] After hours, contact General Neurology

## 2015-09-30 NOTE — Evaluation (Signed)
Speech Language Pathology Evaluation Patient Details Name: Jenna Delgado MRN: [id number] DOB: 04/25/1960 Today's Date: 09/30/2015 Time: [id number] SLP Time Calculation (min) (ACUTE ONLY): 23 min  Problem List:  Patient Active Problem List   Diagnosis Date Noted  . Acute CVA (cerebrovascular accident) (HCC) 09/29/2015  . Abnormal liver function test 09/29/2015  . Asthma 09/29/2015  . Bipolar disorder (HCC) 09/29/2015  . Hyperlipidemia 09/26/2015  . Stroke (cerebrum) (HCC) 09/25/2015  . Hyperglycemia 09/25/2015  . Acute ischemic stroke [facility](HCC)    Past Medical History:  Past Medical History  Diagnosis Date  . Asthma   . Prediabetes   . Seasonal allergic rhinitis   . Mild bipolar disorder [facility](HCC)    Past Surgical History:  Past Surgical History  Procedure Laterality Date  . Abdominal hysterectomy    . Bladder suspension     HPI:  Pt is a 55 y.o. female with PMHx of Asthma, Glucose intolerance who presented on 09/25/15 with c/o L sided numbness. Left hand weakness as well. MRI on 6/25 revealed Posterior R frontal lobe CVA. Pt d/c home 6/27 and returned on 6/29 for slurred speech and facial numbness. MRI on 6/29 showed slight extension of the previously identified acute infarct.   Assessment / Plan / Recommendation Clinical Impression  Pt scored a 29/30 on the MoCA, indicative of cognitive-linguistic function WFL. One point deducted on delayed recall as she needed multiple choice cue to retrieve one word. Language was fluent throughout testing and in conversational speech. She produced 27+ words in one minute on divergent naming task. Pt reports difficulty with intelligibility of speech and tongue movement. ROM and strength appear WFL and speech is intelligible in conversation. During sentence repetition she has halting speech, and attributes this to difficulty with prnunciation. SLP to s/o acutely, but will recommend OP SLP for brief f/u should symptoms not subside upon return home.   SLP Assessment  All further Speech Lanaguage Pathology  needs can be addressed in the next venue of care    Follow Up Recommendations  Outpatient SLP    Frequency and Duration           SLP Evaluation Prior Functioning  Cognitive/Linguistic Baseline: Within functional limits Type of Home: House Available Help at Discharge: Family;Available PRN/intermittently Vocation: Full time employment Archivist(artist)   Cognition  Overall Cognitive Status: Within Functional Limits for tasks assessed    Comprehension  Auditory Comprehension Overall Auditory Comprehension: Appears within functional limits for tasks assessed    Expression Expression Primary Mode of Expression: Verbal Verbal Expression Overall Verbal Expression: Appears within functional limits for tasks assessed Written Expression Dominant Hand: Right   Oral / Motor  Oral Motor/Sensory Function Overall Oral Motor/Sensory Function: Within functional limits Motor Speech Overall Motor Speech: Other (comment) (pt feels her speech is slow, hard to pronounce words)   GO                   Maxcine HamLaura Paiewonsky, M.A. CCC-SLP [phone]  Maxcine Hamaiewonsky, Loyce Klasen 09/30/2015, 11:57 AM

## 2015-09-30 NOTE — Care Management Note (Signed)
Case Management Note  Patient Details  Name: Jenna Delgado MRN: [id number] Date of Birth: 1961/03/06  Subjective/Objective:                    Action/Plan: Pt discharging home with self care. CM consulted for outpatient OT/ST. CM met with the patient and she would like to attend [facility]. Orders placed in EPIC and information on the AVS. Bedside RN updated.   Expected Discharge Date:                  Expected Discharge Plan:  Home/Self Care  In-House Referral:     Discharge planning Services  CM Consult  Post Acute Care Choice:    Choice offered to:     DME Arranged:    DME Agency:     HH Arranged:    [place] Agency:     Status of Service:  Completed, signed off  If discussed at [employer] of Stay Meetings, dates discussed:    Additional Comments:  Pollie Friar, RN 09/30/2015, 3:50 PM

## 2015-09-30 NOTE — Progress Notes (Signed)
Pt being discharged home via wheelchair with family. Pt alert and oriented x4. VSS. Pt c/o no pain at this time. No signs of respiratory distress. Education complete and care plans resolved. IV removed with catheter intact and pt tolerated well. No further issues at this time. Pt to follow up with PCP. Leilanee Righetti R, RN 

## 2015-09-30 NOTE — Discharge Instructions (Signed)
Stroke Prevention °Some health problems and behaviors may make it more likely for you to have a stroke. Below are ways to lessen your risk of having a stroke.  °· Be active for at least 30 minutes on most or all days. °· Do not smoke. Try not to be around others who smoke. °· Do not drink too much alcohol. °¨ Do not have more than 2 drinks a day if you are a man. °¨ Do not have more than 1 drink a day if you are a woman and are not pregnant. °· Eat healthy foods, such as fruits and vegetables. If you were put on a specific diet, follow the diet as told. °· Keep your cholesterol levels under control through diet and medicines. Look for foods that are low in saturated fat, trans fat, cholesterol, and are high in fiber. °· If you have diabetes, follow all diet plans and take your medicine as told. °· Ask your doctor if you need treatment to lower your blood pressure. If you have high blood pressure (hypertension), follow all diet plans and take your medicine as told by your doctor. °· If you are 18-39 years old, have your blood pressure checked every 3-5 years. If you are age 40 or older, have your blood pressure checked every year. °· Keep a healthy weight. Eat foods that are low in calories, salt, saturated fat, trans fat, and cholesterol. °· Do not take drugs. °· Avoid birth control pills, if this applies. Talk to your doctor about the risks of taking birth control pills. °· Talk to your doctor if you have sleep problems (sleep apnea). °· Take all medicine as told by your doctor. °¨ You may be told to take aspirin or blood thinner medicine. Take this medicine as told by your doctor. °¨ Understand your medicine instructions. °· Make sure any other conditions you have are being taken care of. °GET HELP RIGHT AWAY IF: °· You suddenly lose feeling (you feel numb) or have weakness in your face, arm, or leg. °· Your face or eyelid hangs down to one side. °· You suddenly feel confused. °· You have trouble talking (aphasia)  or understanding what people are saying. °· You suddenly have trouble seeing in one or both eyes. °· You suddenly have trouble walking. °· You are dizzy. °· You lose your balance or your movements are clumsy (uncoordinated). °· You suddenly have a very bad headache and you do not know the cause. °· You have new chest pain. °· Your heart feels like it is fluttering or skipping a beat (irregular heartbeat). °Do not wait to see if the symptoms above go away. Get help right away. Call your local emergency services (911 in [place] Do not drive yourself to the hospital. °  °This information is not intended to replace advice given to you by your health care provider. Make sure you discuss any questions you have with your health care provider. °  °Document Released: 09/18/2011 Document Revised: 04/09/2014 Document Reviewed: 09/19/2012 °Elsevier Interactive Patient Education ©2016 Elsevier Inc. ° °

## 2015-09-30 NOTE — Progress Notes (Signed)
PT Cancellation Note  Patient Details Name: Jenna Delgado MRN: [id number] DOB: 1961/01/09   Cancelled Treatment:    Reason Eval/Treat Not Completed: PT screened, no needs identified, will sign off Pt functioning at baseline since recent CVA with mild left sided weakness and ambulating with supervision per OT and does not require skilled therapy services. Pt independent PTA. Reports her deficits are more related to speech. Pt eager to get OPPT as it was not setup after last admission. Will sign off. Thanks.   Blake DivineShauna A Moishy Laday 09/30/2015, 1:03 PM  Mylo RedShauna Brieana Shimmin, PT, DPT [phone]

## 2015-09-30 NOTE — Evaluation (Signed)
Occupational Therapy Evaluation and Discharge Patient Details Name: Jenna CountsKaren M Delgado MRN: [id number] DOB: Aug 30, 1960 Today's Date: 09/30/2015    History of Present Illness Pt is a 55 y.o. female with PMHx of Asthma, Glucose intolerance who presented on 09/25/15 with c/o L sided numbness. Left hand weakness as well. MRI on 6/25 revealed Posterior R frontal lobe CVA. Pt d/c home 6/27 and returned on 6/29 for slurred speech and facial numbness. MRI on 6/29 showed slight extension of the previously identified acute infarct.   Clinical Impression   Pt reports she was managing ADLs and mobility independently PTA. Currently pt overall supervision for safety with ADLs and functional mobility. Pt reports L sided weakness is residual from prior admission; has not gotten worse. Pt only reports changes in speech since prior admission. Pt able to recall all fine and gross motor coordination and strengthening exercises taught by OT last admission. Recommending outpatient OT for follow up to maximize functional independence with L UE. No further acute OT needs identified; signing off at this time. Please re-consult if needs change.    Follow Up Recommendations  Outpatient OT    Equipment Recommendations  None recommended by OT    Recommendations for Other Services       Precautions / Restrictions Precautions Precautions: None Restrictions Weight Bearing Restrictions: No      Mobility Bed Mobility Overal bed mobility: Independent                Transfers Overall transfer level: Needs assistance Equipment used: None Transfers: Sit to/from Stand Sit to Stand: Supervision         General transfer comment: No unsteadiness or LOB noted.    Balance Overall balance assessment: No apparent balance deficits (not formally assessed)                                          ADL Overall ADL's : Needs assistance/impaired                                      Functional mobility during ADLs: Supervision/safety General ADL Comments: Pt currently overall supervision for safety with ADLs and functional mobility. Pt able to recall fine and gross motor coordination and strengthening exercises taught by OT last hospial admission; discussed continued repitition of exercises. Pt reports she would like to be set up with outpatient therapies this time instead of HH therapies like she was last time; agree with recommendation for outpatient OT.     Vision Vision Assessment?: No apparent visual deficits   Perception     Praxis      Pertinent Vitals/Pain Pain Assessment: No/denies pain     Hand Dominance Right   Extremity/Trunk Assessment Upper Extremity Assessment Upper Extremity Assessment: LUE deficits/detail LUE Deficits / Details: grossly 4/5. Pt reports decreased sensation but overall improving since last admission. LUE Sensation: decreased light touch LUE Coordination: decreased fine motor;decreased gross motor   Lower Extremity Assessment Lower Extremity Assessment: Defer to PT evaluation   Cervical / Trunk Assessment Cervical / Trunk Assessment: Normal   Communication Communication Communication: No difficulties   Cognition Arousal/Alertness: Awake/alert Behavior During Therapy: WFL for tasks assessed/performed Overall Cognitive Status: Within Functional Limits for tasks assessed  General Comments       Exercises       Shoulder Instructions      Home Living Family/patient expects to be discharged to:: Private residence Living Arrangements: Spouse/significant other Available Help at Discharge: Family;Available PRN/intermittently Type of Home: House Home Access: Stairs to enter Entergy CorporationEntrance Stairs-Number of Steps: 3 Entrance Stairs-Rails: None Home Layout: Two level;Bed/bath upstairs Alternate Level Stairs-Number of Steps: flight Alternate Level Stairs-Rails: Right Bathroom Shower/Tub: Walk-in  shower;Door Shower/tub characteristics: Sport and exercise psychologistDoor Bathroom Toilet: Standard     Home Equipment: Information systems managerhower seat - built in          Prior Functioning/Environment Level of Independence: Independent        Comments: 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, plays piano, does alot of key boarding    OT Diagnosis: Generalized weakness   OT Problem List:     OT Treatment/Interventions:      OT Goals(Current goals can be found in the care plan section) Acute Rehab OT Goals OT Goal Formulation: All assessment and education complete, DC therapy  OT Frequency:     Barriers to D/C:            Co-evaluation              End of Session Equipment Utilized During Treatment: Gait belt  Activity Tolerance: Patient tolerated treatment well Patient left: in bed;with call bell/phone within reach   Time: 1014-1028 OT Time Calculation (min): 14 min Charges:  OT General Charges $OT Visit: 1 Procedure OT Evaluation $OT Eval Low Complexity: 1 Procedure G-Codes:     Gaye AlkenBailey A Jedd Schulenburg M.S., OTR/L Pager: [phone]  09/30/2015, 10:50 AM

## 2015-10-01 LAB — HEPATITIS PANEL, ACUTE
HCV Ab: <0.1 s/co ratio (ref 0.0–0.9)
HEP B C IGM: NEGATIVE
Hep A IgM: NEGATIVE
Hepatitis B Surface Ag: NEGATIVE

## 2015-10-01 LAB — HEMOGLOBIN A1C
Hgb A1c MFr Bld: 6 % — ABNORMAL HIGH (ref 4.8–5.6)
MEAN PLASMA GLUCOSE: 126 mg/dL

## 2015-10-03 ENCOUNTER — Ambulatory Visit (HOSPITAL_COMMUNITY)
Admission: RE | Admit: 2015-10-03 | Discharge: 2015-10-03 | Disposition: A | Discharge status: Home / Self Care | Payer: BLUE CROSS/BLUE SHIELD | Source: Ambulatory Visit | Attending: Cardiovascular Disease | Admitting: Cardiovascular Disease

## 2015-10-03 ENCOUNTER — Ambulatory Visit (HOSPITAL_BASED_OUTPATIENT_CLINIC_OR_DEPARTMENT_OTHER): Payer: BLUE CROSS/BLUE SHIELD

## 2015-10-03 ENCOUNTER — Encounter (HOSPITAL_COMMUNITY): Payer: Self-pay | Admitting: *Deleted

## 2015-10-03 ENCOUNTER — Encounter (HOSPITAL_COMMUNITY)
Admission: RE | Disposition: A | Discharge status: Home / Self Care | Payer: Self-pay | Source: Ambulatory Visit | Attending: Cardiovascular Disease

## 2015-10-03 DIAGNOSIS — Z7982 Long term (current) use of aspirin: Secondary | ICD-10-CM | POA: Diagnosis not present

## 2015-10-03 DIAGNOSIS — Z6830 Body mass index (BMI) 30.0-30.9, adult: Secondary | ICD-10-CM | POA: Insufficient documentation

## 2015-10-03 DIAGNOSIS — I639 Cerebral infarction, unspecified: Secondary | ICD-10-CM | POA: Insufficient documentation

## 2015-10-03 DIAGNOSIS — I69328 Other speech and language deficits following cerebral infarction: Secondary | ICD-10-CM | POA: Insufficient documentation

## 2015-10-03 DIAGNOSIS — R7303 Prediabetes: Secondary | ICD-10-CM | POA: Diagnosis not present

## 2015-10-03 DIAGNOSIS — E785 Hyperlipidemia, unspecified: Secondary | ICD-10-CM | POA: Insufficient documentation

## 2015-10-03 DIAGNOSIS — E669 Obesity, unspecified: Secondary | ICD-10-CM | POA: Insufficient documentation

## 2015-10-03 DIAGNOSIS — I69354 Hemiplegia and hemiparesis following cerebral infarction affecting left non-dominant side: Secondary | ICD-10-CM | POA: Insufficient documentation

## 2015-10-03 HISTORY — PX: EP IMPLANTABLE DEVICE: SHX172B

## 2015-10-03 HISTORY — PX: TEE WITHOUT CARDIOVERSION: SHX5443

## 2015-10-03 LAB — FACTOR 5 LEIDEN

## 2015-10-03 LAB — PROTHROMBIN GENE MUTATION

## 2015-10-03 SURGERY — ECHOCARDIOGRAM, TRANSESOPHAGEAL
Anesthesia: Moderate Sedation

## 2015-10-03 SURGERY — LOOP RECORDER INSERTION
Anesthesia: LOCAL

## 2015-10-03 MED ORDER — BUTAMBEN-TETRACAINE-BENZOCAINE 2-2-14 % EX AERO
INHALATION_SPRAY | CUTANEOUS | Status: DC | PRN
  Administered 2015-10-03: 2 via TOPICAL

## 2015-10-03 MED ORDER — FENTANYL CITRATE (PF) 100 MCG/2ML IJ SOLN
INTRAMUSCULAR | Status: DC | PRN
  Administered 2015-10-03 (×2): 50 ug via INTRAVENOUS

## 2015-10-03 MED ORDER — MIDAZOLAM HCL 5 MG/ML IJ SOLN
INTRAMUSCULAR | Status: AC
  Filled 2015-10-03: qty 2

## 2015-10-03 MED ORDER — MIDAZOLAM HCL 10 MG/2ML IJ SOLN
INTRAMUSCULAR | Status: DC | PRN
  Administered 2015-10-03: 2 mg via INTRAVENOUS
  Administered 2015-10-03: 1 mg via INTRAVENOUS
  Administered 2015-10-03: 2 mg via INTRAVENOUS

## 2015-10-03 MED ORDER — SODIUM CHLORIDE 0.9 % IV SOLN: INTRAVENOUS | Status: DC

## 2015-10-03 MED ORDER — LIDOCAINE-EPINEPHRINE 1 %-1:100000 IJ SOLN
INTRAMUSCULAR | Status: DC | PRN
  Administered 2015-10-03: 15 mL via INTRADERMAL

## 2015-10-03 MED ORDER — LIDOCAINE-EPINEPHRINE 1 %-1:100000 IJ SOLN
INTRAMUSCULAR | Status: AC
  Filled 2015-10-03: qty 1

## 2015-10-03 MED ORDER — FENTANYL CITRATE (PF) 100 MCG/2ML IJ SOLN
INTRAMUSCULAR | Status: AC
  Filled 2015-10-03: qty 2

## 2015-10-03 SURGICAL SUPPLY — 2 items
LOOP REVEAL LINQSYS (Prosthesis & Implant Heart) ×2 IMPLANT
PACK LOOP INSERTION (CUSTOM PROCEDURE TRAY) ×2 IMPLANT

## 2015-10-03 NOTE — Interval H&P Note (Signed)
History and Physical Interval Note:  10/03/2015 12:08 PM  Jenna CountsKaren M Madril  has presented today for surgery, with the diagnosis of STROKE  The various methods of treatment have been discussed with the patient and family. After consideration of risks, benefits and other options for treatment, the patient has consented to  Procedure(s): TRANSESOPHAGEAL ECHOCARDIOGRAM (TEE) (N/A) as a surgical intervention .  The patient's history has been reviewed, patient examined, no change in status, stable for surgery.  I have reviewed the patient's chart and labs.  Questions were answered to the patient's satisfaction.     Kristeen MissPhilip Eular Panek

## 2015-10-03 NOTE — Discharge Instructions (Signed)
Moderate Conscious Sedation, Adult °Sedation is the use of medicines to promote relaxation and relieve discomfort and anxiety. Moderate conscious sedation is a type of sedation. Under moderate conscious sedation you are less alert than normal but are still able to respond to instructions or stimulation. Moderate conscious sedation is used during short medical and dental procedures. It is milder than deep sedation or general anesthesia and allows you to return to your regular activities sooner. °LET YOUR HEALTH CARE PROVIDER KNOW ABOUT:  °· Any allergies you have. °· All medicines you are taking, including vitamins, herbs, eye drops, creams, and over-the-counter medicines. °· Use of steroids (by mouth or creams). °· Previous problems you or members of your family have had with the use of anesthetics. °· Any blood disorders you have. °· Previous surgeries you have had. °· Medical conditions you have. °· Possibility of pregnancy, if this applies. °· Use of cigarettes, alcohol, or illegal drugs. °RISKS AND COMPLICATIONS °Generally, this is a safe procedure. However, as with any procedure, problems can occur. Possible problems include: °· Oversedation. °· Trouble breathing on your own. You may need to have a breathing tube until you are awake and breathing on your own. °· Allergic reaction to any of the medicines used for the procedure. °BEFORE THE PROCEDURE °· You may have blood tests done. These tests can help show how well your kidneys and liver are working. They can also show how well your blood clots. °· A physical exam will be done.   °· Only take medicines as directed by your health care provider. You may need to stop taking medicines (such as blood thinners, aspirin, or nonsteroidal anti-inflammatory drugs) before the procedure.   °· Do not eat or drink at least 6 hours before the procedure or as directed by your health care provider. °· Arrange for a responsible adult, family member, or friend to take you home  after the procedure. He or she should stay with you for at least 24 hours after the procedure, until the medicine has worn off. °PROCEDURE  °· An intravenous (IV) catheter will be inserted into one of your veins. Medicine will be able to flow directly into your body through this catheter. You may be given medicine through this tube to help prevent pain and help you relax. °· The medical or dental procedure will be done. °AFTER THE PROCEDURE °· You will stay in a recovery area until the medicine has worn off. Your blood pressure and pulse will be checked.   °·  Depending on the procedure you had, you may be allowed to go home when you can tolerate liquids and your pain is under control. °  °This information is not intended to replace advice given to you by your health care provider. Make sure you discuss any questions you have with your health care provider. °  °Document Released: 12/12/2000 Document Revised: 04/09/2014 Document Reviewed: 11/24/2012 °Elsevier Interactive Patient Education ©2016 [employer] ° °

## 2015-10-03 NOTE — H&P (View-Only) (Signed)
STROKE TEAM PROGRESS NOTE   HISTORY OF PRESENT ILLNESS (per record) Jenna Delgado is an 55 y.o. female with recent stroke on 09/27/15 with similar symptoms of L sided arm and leg weakness but today presents with worsening symptoms, world finding difficulty and more pronounced leg weakness. No TPA given due to hx of recent stroke. Currently on ASA. CTH negative. She was last known well 09/29/15 at 1330. No tPA Given due to hx of recent stroke,    SUBJECTIVE (INTERVAL HISTORY) There is a friend at the bedside. More friends arrived during rounds.    OBJECTIVE Temp:  [97.5 F (36.4 C)-98.6 F (37 C)] 98.6 F (37 C) (06/30 0924) Pulse Rate:  [60-100] 65 (06/30 0924) Cardiac Rhythm:  [-] Normal sinus rhythm (06/30 0700) Resp:  [12-20] 20 (06/30 0924) BP: (96-140)/(56-81) 100/74 mmHg (06/30 0924) SpO2:  [96 %-99 %] 98 % (06/30 0924) FiO2 (%):  [21 %] 21 % (06/29 2040) Weight:  [79.379 kg (175 lb)] 79.379 kg (175 lb) (06/29 2007)  CBC:   Recent Labs Lab 09/29/15 1656 09/30/15 0820  WBC 7.2 5.3  NEUTROABS 4.2 2.4  HGB 13.6 13.1  HCT 41.1 40.1  MCV 93.8 94.8  PLT 221 349    Basic Metabolic Panel:   Recent Labs Lab 09/29/15 1656 09/30/15 0820  NA 139 142  K 3.6 4.2  CL 105 111  CO2 27 25  GLUCOSE 179* 105*  BUN 12 12  CREATININE 0.87 0.84  CALCIUM 9.6 9.1    Lipid Panel:     Component Value Date/Time   CHOL 150 09/30/2015 0820   TRIG 111 09/30/2015 0820   HDL 51 09/30/2015 0820   CHOLHDL 2.9 09/30/2015 0820   VLDL 22 09/30/2015 0820   LDLCALC 77 09/30/2015 0820   HgbA1c:  Lab Results  Component Value Date   HGBA1C 5.7* 09/26/2015   Urine Drug Screen:     Component Value Date/Time   LABOPIA NONE DETECTED 09/26/2015 1635   COCAINSCRNUR NONE DETECTED 09/26/2015 1635   LABBENZ NONE DETECTED 09/26/2015 1635   AMPHETMU NONE DETECTED 09/26/2015 1635   THCU NONE DETECTED 09/26/2015 1635   LABBARB NONE DETECTED 09/26/2015 1635      IMAGING  Ct Head Wo  Contrast  09/29/2015  CLINICAL DATA:  Slurred speech.  History of strokes. EXAM: CT HEAD WITHOUT CONTRAST TECHNIQUE: Contiguous axial images were obtained from the base of the skull through the vertex without intravenous contrast. COMPARISON:  MR brain 09/25/2015 FINDINGS: There is no evidence of mass effect, midline shift or extra-axial fluid collections. There is no evidence of a space-occupying lesion or intracranial hemorrhage. There is no evidence of a cortical-based area of acute infarction. Small area of low attenuation in the right posterior frontal lobe consistent with a small subacute infarct. The ventricles and sulci are appropriate for the patient's age. The basal cisterns are patent. Visualized portions of the orbits are unremarkable. The visualized portions of the paranasal sinuses and mastoid air cells are unremarkable. The osseous structures are unremarkable. IMPRESSION: 1. Small stable right posterior frontal lobe subacute infarct without hemorrhagic transformation. 2. No new area of infarction. Critical Value/emergent results were called by telephone at the time of interpretation on 09/29/2015 at 4:48 pm to Dr. Cristobal Goldmann, who verbally acknowledged these results. Electronically Signed   By: Kathreen Devoid   On: 09/29/2015 16:45   Mr Brain Wo Contrast  09/29/2015  CLINICAL DATA:  Worsening deficits from previously documented stroke. EXAM: MRI HEAD WITHOUT CONTRAST TECHNIQUE:  Multiplanar, multiecho pulse sequences of the brain and surrounding structures were obtained without intravenous contrast. COMPARISON:  MRI brain 09/25/2015.  CT head earlier today. FINDINGS: The distribution of acute infarction noted previously in similar, slightly larger, with increased intensity of signal on diffusion-weighted imaging. Suspect slight extension of the previously identified acute infarct. Within limits for detection on MR, no hemorrhagic transformation. Evolving cytotoxic edema. No new areas of infarction. No  visible large vessel or major MCA branch occlusion. Normal for age cerebral volume. Mild subcortical and periventricular T2 and FLAIR hyperintensities, likely chronic microvascular ischemic change. Pituitary, pineal, and cerebellar tonsils unremarkable. No upper cervical lesions. Flow voids are preserved. Extracranial soft tissues grossly unremarkable. IMPRESSION: Small acute RIGHT posterior frontal cortical and subcortical infarction, slightly increased in size from 09/25/2015, also with increased intensity of signal on diffusion-weighted imaging, consistent with the clinical symptoms of worsening deficit. No hemorrhagic transformation. Otherwise stable scan. Electronically Signed   By: Staci Righter M.D.   On: 09/29/2015 20:36   US Abdomen Limited Ruq  09/30/2015  CLINICAL DATA:  Elevated liver function studies EXAM: US ABDOMEN LIMITED - RIGHT UPPER QUADRANT COMPARISON:  Abdominal ultrasound of January 11 2006 FINDINGS: Gallbladder: The gallbladder is adequately distended. A previously described gallbladder polyp all along the anterior mucosal surface is no longer evident. No stones or sludge are observed. There is no wall thickening or sonographic Murphy's sign. Common bile duct: Diameter: 4.2 mm Liver: The hepatic echotexture is mildly increased diffusely. There is no focal mass nor ductal dilation. IMPRESSION: No evidence of gallstones nor other acute gallbladder abnormality. If there are clinical concerns of gallbladder dysfunction, a nuclear medicine hepatobiliary scan may be useful. Increased hepatic echotexture is consistent with fatty infiltrative change. Electronically Signed   By: David  Martinique M.D.   On: 09/30/2015 11:26    PHYSICAL EXAM Pleasant middle aged mildly obese Caucasian lady  not in distress. . Afebrile. Head is nontraumatic. Neck is supple without bruit.    Cardiac exam no murmur or gallop. Lungs are clear to auscultation. Distal pulses are well felt. Neurological Exam :  Awake  alert oriented 3 with normal speech and language function. No aphasia or apraxia dysarthria. Pupils are equal reactive. Fundi were not visualized. Vision acuity and fields seem adequate. Face is symmetric without weakness. Tongue is midline. Motor system exam no upper or lower eczema to drift. Mild weakness of left grip and intrinsic hand muscles. Orbits right over left upper extremity. Findings in the movements are diminished on the left. Mild subjective left hemibody sensory loss to touch pinprick and vibration but splitting of the midline. The patient is a symmetric. Plantars are downgoing. Gait was not tested.   ASSESSMENT/PLAN [name] is a 55 y.o. female with history of prediabetes, obesity who was here for stroke 09/27/15 presenting with worsening of slurred speech.  She was not considered for IV t-PA due to recent stroke.  Slurred speech secondary to right frontal lobe infarct earlier in the week. NO NEW stroke. Stroke felt to be embolic secondary to unknown source  Resultant  Mild left sided sensory and proprioceptive difficulty  MRI  Normal evolution of small R posterior frontal cortical and subcortical infarct. No new stroke  Hypercoagulation panel negative so far ( factor 5 leiden and prothrombin 2 mutation remain pending )   ANA, ESR normal  TEE and loop previously requested as an OP - awaiting cardiology to schedule. Note that Monday's schedule is full, likely due to holiday on  Tues. Have requested follow up. Ok to continue with plan as an OP  HgbA1c 5.7  Lovenox for VTE prophylaxis Diet heart healthy/carb modified Room service appropriate?: Yes; Fluid consistency:: Thin  aspirin 325 mg daily prior to admission, continued aspirin 325 mg daily. Recommend continuation.  Patient counseled to be compliant with her antithrombotic medications  Ongoing aggressive stroke risk factor management  Therapy recommendations:  Continue OP PT, OP OT  Disposition:  Return  home  Hyperlipidemia  Home meds: on lipitor 80  LDL 124, goal < 70  Continue statin at discharge  Pre-Diabetes  HgbA1c 5.7 goal < 7.0  Other Stroke Risk Factors  ETOH use, advised to drink no more than 1 drink(s) a day  Obesity, There is no weight on file to calculate BMI., recommend weight loss, diet and exercise as appropriate   Family hx stroke ( unknown as she is adiopted)  Hospital day # [place] for Pager information 09/30/2015 12:53 PM  I have personally examined this patient, reviewed notes, independently viewed imaging studies, participated in medical decision making and plan of care. I have made any additions or clarifications directly to the above note. Agree with note above. She presented with transient worsening of her recent stroke deficits of left-sided weakness and numbness and MRI shows expected evolutionary changes in the right brain infarct without any new medication infarct. Recommend no further stroke workup in house but arrange for outpatient TEE followed by cardiac monitoring. Continue aspirin and statin and follow-up with me as an outpatient in 2 months. Greater than 50% time during this 25 minute visit was spent on counseling and coordination of care about stroke risk, prevention and treatment  Antony Contras, MD Medical Director [place] Pager: [phone] 09/30/2015 4:33 PM  To contact Stroke Continuity provider, please refer to [email or web address] After hours, contact General Neurology

## 2015-10-03 NOTE — Progress Notes (Signed)
  Echocardiogram Echocardiogram Transesophageal has been performed.  [name], Jenna Delgado 10/03/2015, 12:43 PM

## 2015-10-03 NOTE — CV Procedure (Signed)
    Transesophageal Echocardiogram Note  Earlie CountsKaren M Zieger [id number] 1961-03-27  Procedure: Transesophageal Echocardiogram Indications: CVA  Procedure Details Consent: Obtained Time Out: Verified patient identification, verified procedure, site/side was marked, verified correct patient position, special equipment/implants available, Radiology Safety Procedures followed,  medications/allergies/relevent history reviewed, required imaging and test results available.  Performed  Medications:  During this procedure the patient is administered a total of Versed 5 mg and Fentanyl 100 mcg  to achieve and maintain moderate conscious sedation.  The patient's heart rate, blood pressure, and oxygen saturation are monitored continuously during the procedure. The period of conscious sedation is 30  minutes, of which I was present face-to-face 100% of this time.  Left Ventrical:  Normal LV   Mitral Valve: normal   Aortic Valve: normal  Tricuspid Valve: normal  Pulmonic Valve: normal  Left Atrium/ Left atrial appendage: no thrombi  Atrial septum: + PFO by bubble studh   Aorta: mild calcification    Complications: No apparent complications Patient did tolerate procedure well.  Will get venous dopplers to look for DVT. If negative, will place a loop recorder   Vesta MixerPhilip J. Lizet Kelso, Montez HagemanJr., MD, [facility] CenterFACC 10/03/2015, 12:38 PM

## 2015-10-03 NOTE — Consult Note (Signed)
ELECTROPHYSIOLOGY CONSULT NOTE  Patient ID: Jenna Delgado MRN: [id number], DOB/AGE: [birth year]   Admit date: 10/03/2015 Date of Consult: 10/03/2015  Primary Physician: Gaye Alken, MD Primary Cardiologist: new to [facility] Reason for Consultation: Cryptogenic stroke; recommendations regarding Implantable Loop Recorder  History of Present Illness Jenna Delgado was admitted on 09/29/15 with slurred speech.  They first developed symptoms while being seen at her primary care's office and was sent to the ER for evaluation.  Imaging demonstrated small right posterior frontal cortical and subcortical infarct felt to be embolic secondary to unknown source.  She has undergone workup for stroke including echocardiogram and carotid dopplers.  The patient has been monitored on telemetry which has demonstrated sinus rhythm with no arrhythmias.  Inpatient stroke work-up is to be completed with a TEE.   Echocardiogram this admission demonstrated EF 55-60%, grade 1 diastolic dysfunction, LA 41.  Lab work is reviewed.  She has occasional palpitations but denies chest pain, shortness of breath, dizziness, palpitations, or syncope.   EP has been asked to evaluate for placement of an implantable loop recorder to monitor for atrial fibrillation.  Past Medical History  Diagnosis Date  . Asthma   . Prediabetes   . Seasonal allergic rhinitis   . Mild bipolar disorder [facility])      Surgical History:  Past Surgical History  Procedure Laterality Date  . Abdominal hysterectomy    . Bladder suspension       Prescriptions prior to admission  Medication Sig Dispense Refill Last Dose  . aspirin 325 MG tablet Take 1 tablet (325 mg total) by mouth daily. 30 tablet 0 10/02/2015 at Unknown time  . atorvastatin (LIPITOR) 80 MG tablet Take 1 tablet (80 mg total) by mouth daily at 6 PM. 30 tablet 1 10/02/2015 at Unknown time  . clonazePAM (KLONOPIN) 0.5 MG tablet Take 0.5 mg by mouth daily as needed for  anxiety.    10/03/2015 at 1030  . esomeprazole (NEXIUM) 20 MG capsule Take 20 mg by mouth daily as needed (for heartburn or acid reflux).    10/02/2015 at Unknown time  . OLANZapine-FLUoxetine (SYMBYAX) 3-25 MG capsule Take 1 capsule by mouth at bedtime.  1 10/02/2015 at Unknown time    Inpatient Medications:    Allergies:  Allergies  Allergen Reactions  . Benadryl [Diphenhydramine Hcl] Other (See Comments)    Heartpounding, panic attack  . Imitrex [Sumatriptan] Other (See Comments)    Heartpounding, panic attack  . Iodine Other (See Comments)    IVP test problems  . Ivp Dye [Iodinated Diagnostic Agents] Other (See Comments)    IVP test problems  . Lobster [Shellfish Allergy] Itching  . Lamictal [Lamotrigine] Rash  . Tetracyclines & Related Rash    Blisters down the throat  . Theophyllines Rash    Target rash    Social History   Social History  . Marital Status: Married    Spouse Name: N/A  . Number of Children: N/A  . Years of Education: N/A   Occupational History  . Not on file.   Social History Main Topics  . Smoking status: Never Smoker   . Smokeless tobacco: Never Used  . Alcohol Use: 0.6 oz/week    1 Standard drinks or equivalent per week     Comment: occasionally  . Drug Use: No  . Sexual Activity: Not on file   Other Topics Concern  . Not on file   Social History Narrative     Family History  Problem Relation Age of Onset  . Adopted: Yes      Review of Systems: All other systems reviewed and are otherwise negative except as noted above.  Physical Exam: Filed Vitals:   10/03/15 1050  BP: 120/69  Pulse: 87  Temp: 98.2 F (36.8 C)  TempSrc: Oral  Resp: 16  SpO2: 95%    GEN- The patient is well appearing, alert and oriented x 3 today.   Head- normocephalic, atraumatic Eyes-  Sclera clear, conjunctiva pink Ears- hearing intact Oropharynx- clear Neck- supple Lungs- Clear to ausculation bilaterally, normal work of breathing Heart- Regular  rate and rhythm, no murmurs, rubs or gallops  GI- soft, NT, ND, + BS Extremities- no clubbing, cyanosis, or edema MS- no significant deformity or atrophy Skin- no rash or lesion Psych- euthymic mood, full affect   Labs:   Lab Results  Component Value Date   WBC 5.3 09/30/2015   HGB 13.1 09/30/2015   HCT 40.1 09/30/2015   MCV 94.8 09/30/2015   PLT 199 09/30/2015    Recent Labs Lab 09/30/15 0820  NA 142  K 4.2  CL 111  CO2 25  BUN 12  CREATININE 0.84  CALCIUM 9.1  PROT 6.3*  BILITOT 0.9  ALKPHOS 56  ALT 58*  AST 40  GLUCOSE 105*    Radiology/Studies: Dg Chest 2 View 09/26/2015  CLINICAL DATA:  Stroke.  History of asthma. EXAM: CHEST  2 VIEW COMPARISON:  Chest radiograph December 17, 2012 FINDINGS: The heart size and mediastinal contours are within normal limits. Mild bronchitic changes. Both lungs are clear. The visualized skeletal structures are unremarkable. IMPRESSION: Mild bronchitic changes. Electronically Signed   By: Awilda Metroourtnay  Bloomer M.D.   On: 09/26/2015 00:33   Ct Head Wo Contrast 09/29/2015  CLINICAL DATA:  Slurred speech.  History of strokes. EXAM: CT HEAD WITHOUT CONTRAST TECHNIQUE: Contiguous axial images were obtained from the base of the skull through the vertex without intravenous contrast. COMPARISON:  MR brain 09/25/2015 FINDINGS: There is no evidence of mass effect, midline shift or extra-axial fluid collections. There is no evidence of a space-occupying lesion or intracranial hemorrhage. There is no evidence of a cortical-based area of acute infarction. Small area of low attenuation in the right posterior frontal lobe consistent with a small subacute infarct. The ventricles and sulci are appropriate for the patient's age. The basal cisterns are patent. Visualized portions of the orbits are unremarkable. The visualized portions of the paranasal sinuses and mastoid air cells are unremarkable. The osseous structures are unremarkable. IMPRESSION: 1. Small stable  right posterior frontal lobe subacute infarct without hemorrhagic transformation. 2. No new area of infarction. Critical Value/emergent results were called by telephone at the time of interpretation on 09/29/2015 at 4:48 pm to Dr. Cherylynn RidgesShikhman, who verbally acknowledged these results. Electronically Signed   By: Elige KoHetal  Patel   On: 09/29/2015 16:45   Mr Maxine GlennMra Head Wo Contrast 09/26/2015  CLINICAL DATA:  Continued surveillance of acute RIGHT frontal infarct. LEFT-sided sensory and motor symptoms. EXAM: MRA HEAD WITHOUT CONTRAST TECHNIQUE: Angiographic images of the Circle of Willis were obtained using MRA technique without intravenous contrast. COMPARISON:  MR brain 09/25/2015. FINDINGS: The internal carotid arteries are widely patent. The basilar artery is widely patent with vertebrals codominant. There is no intracranial stenosis or aneurysm. No MCA branch occlusion is evident. IMPRESSION: Unremarkable MRA of the intracranial circulation. Electronically Signed   By: Elsie StainJohn T Curnes M.D.   On: 09/26/2015 16:28   Mr Brain Wo Contrast 09/29/2015  CLINICAL DATA:  Worsening deficits from previously documented stroke. EXAM: MRI HEAD WITHOUT CONTRAST TECHNIQUE: Multiplanar, multiecho pulse sequences of the brain and surrounding structures were obtained without intravenous contrast. COMPARISON:  MRI brain 09/25/2015.  CT head earlier today. FINDINGS: The distribution of acute infarction noted previously in similar, slightly larger, with increased intensity of signal on diffusion-weighted imaging. Suspect slight extension of the previously identified acute infarct. Within limits for detection on MR, no hemorrhagic transformation. Evolving cytotoxic edema. No new areas of infarction. No visible large vessel or major MCA branch occlusion. Normal for age cerebral volume. Mild subcortical and periventricular T2 and FLAIR hyperintensities, likely chronic microvascular ischemic change. Pituitary, pineal, and cerebellar tonsils  unremarkable. No upper cervical lesions. Flow voids are preserved. Extracranial soft tissues grossly unremarkable. IMPRESSION: Small acute RIGHT posterior frontal cortical and subcortical infarction, slightly increased in size from 09/25/2015, also with increased intensity of signal on diffusion-weighted imaging, consistent with the clinical symptoms of worsening deficit. No hemorrhagic transformation. Otherwise stable scan. Electronically Signed   By: Elsie StainJohn T Curnes M.D.   On: 09/29/2015 20:36   Mr Brain Wo Contrast 09/25/2015  CLINICAL DATA:  Left hand numbness and decreased grip strength beginning this morning. Left-sided numbness as well. EXAM: MRI HEAD WITHOUT CONTRAST TECHNIQUE: Multiplanar, multiecho pulse sequences of the brain and surrounding structures were obtained without intravenous contrast. COMPARISON:  Head CT 04/24/2014 FINDINGS: There is a small acute cortical and subcortical infarct in the posterior right frontal lobe involving the precentral gyrus in the hand motor area. There is no evidence of intracranial hemorrhage, mass, midline shift, or extra-axial fluid collection. Ventricles and sulci are normal. There are mild periventricular and subcortical white matter T2 hyperintensities. Orbits are unremarkable. Mild mucosal thickening is noted in the ethmoid and maxillary sinuses bilaterally. The mastoid air cells are clear. Major intracranial vascular flow voids are preserved. IMPRESSION: 1. Small acute posterior right frontal lobe infarct involving the hand motor area. 2. Mild cerebral white matter disease, nonspecific but may reflect chronic small vessel ischemia. Electronically Signed   By: Sebastian AcheAllen  Grady M.D.   On: 09/25/2015 16:41   12-lead ECG sinus rhythm All prior EKG's in EPIC reviewed with no documented atrial fibrillation  Assessment and Plan:  1. Cryptogenic stroke The patient presents with cryptogenic stroke.  The patient has a TEE planned for this AM.  I spoke at length with the  patient about monitoring for afib with an implantable loop recorder.  Risks, benefits, and alteratives to implantable loop recorder were discussed with the patient today.   At this time, the patient is very clear in their decision to proceed with implantable loop recorder.   Wound care was reviewed with the patient (keep incision clean and dry for 3 days).  Wound check scheduled for 10/17/15 at 11AM   Please call with questions.   Gypsy BalsamAmber Seiler, NP 10/03/2015 11:06 AM   EP Attending  Patient seen and examined. Agree with above. The patient has had a cryptogenic stroke. Will plan to proceed with insertion of an ILR. She had a very small PFO with normal lower extremity dopplers.  Leonia ReevesGregg Sevin Langenbach,M.D.

## 2015-10-05 ENCOUNTER — Encounter (HOSPITAL_COMMUNITY): Payer: Self-pay | Admitting: Cardiovascular Disease

## 2015-10-06 ENCOUNTER — Encounter: Payer: Self-pay | Admitting: Internal Medicine

## 2015-10-07 ENCOUNTER — Telehealth: Payer: Self-pay | Admitting: *Deleted

## 2015-10-07 NOTE — Telephone Encounter (Signed)
LMTCB/sss  Pause episode recorded on 7/6 x 4 sec @ 0559.

## 2015-10-10 NOTE — Telephone Encounter (Signed)
Spoke to patient about episode from 7/6. Patient denies any sx's during the time of the episode. She states that she would have been asleep at the time. Will inform MD and notify patient if anything further is recommended.  Patient also asked about whether or not her machine at home has been working. I informed her that it is working just fine.  Patient also asked about whether or not she should still be experiencing a "sharp" pain at the device site now that it is a week since implant. I informed her that this is not an uncommon finding so soon after implant. Patient denies any drainage or edema from the site, but states that the steri strips are still in place. I told her that we will take the steri strips off at her upcoming appt. Patient voiced understanding.

## 2015-10-10 NOTE — Telephone Encounter (Signed)
Follow-up    The pt is returning the call from friday

## 2015-10-11 ENCOUNTER — Ambulatory Visit: Payer: BLUE CROSS/BLUE SHIELD | Attending: Internal Medicine | Admitting: Occupational Therapy

## 2015-10-17 ENCOUNTER — Ambulatory Visit (INDEPENDENT_AMBULATORY_CARE_PROVIDER_SITE_OTHER): Payer: BLUE CROSS/BLUE SHIELD | Admitting: *Deleted

## 2015-10-17 ENCOUNTER — Encounter: Payer: Self-pay | Admitting: Internal Medicine

## 2015-10-17 DIAGNOSIS — I639 Cerebral infarction, unspecified: Secondary | ICD-10-CM

## 2015-10-17 LAB — CUP PACEART INCLINIC DEVICE CHECK: Date Time Interrogation Session: 20170717151732

## 2015-10-17 NOTE — Progress Notes (Signed)
Wound check in clinic s/p ILR implant. Wound well healed without redness or edema. Incision edges approximated. Normal ILR function. Battery status: Good. R-waves 0.2829mV. 0 symptom episodes, 0 tachy episodes, 1 pause episode--nocturnal; no sx's per pt, 0 brady episodes. 0 AF episodes (0% burden). Monthly summary reports and ROV with GT prn.

## 2015-10-28 ENCOUNTER — Encounter (HOSPITAL_COMMUNITY): Payer: Self-pay | Admitting: *Deleted

## 2015-10-28 ENCOUNTER — Observation Stay (HOSPITAL_COMMUNITY)
Admission: EM | Admit: 2015-10-28 | Discharge: 2015-10-31 | Disposition: A | Discharge status: Home / Self Care | Payer: BLUE CROSS/BLUE SHIELD | Attending: Internal Medicine | Admitting: Internal Medicine

## 2015-10-28 ENCOUNTER — Emergency Department (HOSPITAL_COMMUNITY): Payer: BLUE CROSS/BLUE SHIELD

## 2015-10-28 DIAGNOSIS — R200 Anesthesia of skin: Secondary | ICD-10-CM

## 2015-10-28 DIAGNOSIS — I69351 Hemiplegia and hemiparesis following cerebral infarction affecting right dominant side: Secondary | ICD-10-CM | POA: Insufficient documentation

## 2015-10-28 DIAGNOSIS — E782 Mixed hyperlipidemia: Secondary | ICD-10-CM | POA: Diagnosis present

## 2015-10-28 DIAGNOSIS — Z7982 Long term (current) use of aspirin: Secondary | ICD-10-CM | POA: Insufficient documentation

## 2015-10-28 DIAGNOSIS — I639 Cerebral infarction, unspecified: Secondary | ICD-10-CM | POA: Diagnosis present

## 2015-10-28 DIAGNOSIS — E119 Type 2 diabetes mellitus without complications: Secondary | ICD-10-CM | POA: Insufficient documentation

## 2015-10-28 DIAGNOSIS — G459 Transient cerebral ischemic attack, unspecified: Principal | ICD-10-CM

## 2015-10-28 DIAGNOSIS — Z72820 Sleep deprivation: Secondary | ICD-10-CM | POA: Diagnosis present

## 2015-10-28 DIAGNOSIS — R4701 Aphasia: Secondary | ICD-10-CM

## 2015-10-28 DIAGNOSIS — R945 Abnormal results of liver function studies: Secondary | ICD-10-CM | POA: Diagnosis present

## 2015-10-28 DIAGNOSIS — F319 Bipolar disorder, unspecified: Secondary | ICD-10-CM | POA: Diagnosis present

## 2015-10-28 DIAGNOSIS — E785 Hyperlipidemia, unspecified: Secondary | ICD-10-CM | POA: Diagnosis not present

## 2015-10-28 DIAGNOSIS — Z79899 Other long term (current) drug therapy: Secondary | ICD-10-CM | POA: Diagnosis not present

## 2015-10-28 DIAGNOSIS — F419 Anxiety disorder, unspecified: Secondary | ICD-10-CM | POA: Diagnosis not present

## 2015-10-28 DIAGNOSIS — J45909 Unspecified asthma, uncomplicated: Secondary | ICD-10-CM | POA: Diagnosis present

## 2015-10-28 DIAGNOSIS — R7989 Other specified abnormal findings of blood chemistry: Secondary | ICD-10-CM | POA: Diagnosis present

## 2015-10-28 HISTORY — DX: Cerebral infarction, unspecified: I63.9

## 2015-10-28 HISTORY — DX: Vitamin D deficiency, unspecified: E55.9

## 2015-10-28 LAB — CBC WITH DIFFERENTIAL/PLATELET
BASOS ABS: 0 10*3/uL (ref 0.0–0.1)
BASOS PCT: 0 %
EOS ABS: 0.3 10*3/uL (ref 0.0–0.7)
Eosinophils Relative: 3 %
HCT: 40.2 % (ref 36.0–46.0)
HEMOGLOBIN: 13.2 g/dL (ref 12.0–15.0)
LYMPHS ABS: 2.5 10*3/uL (ref 0.7–4.0)
Lymphocytes Relative: 23 %
MCH: 30.7 pg (ref 26.0–34.0)
MCHC: 32.8 g/dL (ref 30.0–36.0)
MCV: 93.5 fL (ref 78.0–100.0)
Monocytes Absolute: 0.7 10*3/uL (ref 0.1–1.0)
Monocytes Relative: 6 %
NEUTROS PCT: 68 %
Neutro Abs: 7.3 10*3/uL (ref 1.7–7.7)
Platelets: 221 10*3/uL (ref 150–400)
RBC: 4.3 MIL/uL (ref 3.87–5.11)
RDW: 13.9 % (ref 11.5–15.5)
WBC: 10.8 10*3/uL — AB (ref 4.0–10.5)

## 2015-10-28 LAB — APTT: APTT: 31 s (ref 24–36)

## 2015-10-28 LAB — ETHANOL

## 2015-10-28 LAB — COMPREHENSIVE METABOLIC PANEL
ALT: 26 U/L (ref 14–54)
AST: 22 U/L (ref 15–41)
Albumin: 4.2 g/dL (ref 3.5–5.0)
Alkaline Phosphatase: 79 U/L (ref 38–126)
Anion gap: 8 (ref 5–15)
BUN: 17 mg/dL (ref 6–20)
CHLORIDE: 107 mmol/L (ref 101–111)
CO2: 21 mmol/L — AB (ref 22–32)
CREATININE: 0.82 mg/dL (ref 0.44–1.00)
Calcium: 9 mg/dL (ref 8.9–10.3)
Glucose, Bld: 117 mg/dL — ABNORMAL HIGH (ref 65–99)
POTASSIUM: 4.1 mmol/L (ref 3.5–5.1)
SODIUM: 136 mmol/L (ref 135–145)
Total Bilirubin: 1 mg/dL (ref 0.3–1.2)
Total Protein: 7.1 g/dL (ref 6.5–8.1)

## 2015-10-28 LAB — I-STAT TROPONIN, ED: TROPONIN I, POC: 0 ng/mL (ref 0.00–0.08)

## 2015-10-28 LAB — I-STAT CHEM 8, ED
BUN: 21 mg/dL — ABNORMAL HIGH (ref 6–20)
CREATININE: 0.8 mg/dL (ref 0.44–1.00)
Calcium, Ion: 1.11 mmol/L — ABNORMAL LOW (ref 1.13–1.30)
Chloride: 106 mmol/L (ref 101–111)
Glucose, Bld: 118 mg/dL — ABNORMAL HIGH (ref 65–99)
HEMATOCRIT: 40 % (ref 36.0–46.0)
HEMOGLOBIN: 13.6 g/dL (ref 12.0–15.0)
POTASSIUM: 4.1 mmol/L (ref 3.5–5.1)
SODIUM: 141 mmol/L (ref 135–145)
TCO2: 22 mmol/L (ref 0–100)

## 2015-10-28 LAB — TROPONIN I

## 2015-10-28 LAB — PROTIME-INR
INR: 1.04
Prothrombin Time: 13.6 seconds (ref 11.4–15.2)

## 2015-10-28 LAB — CBG MONITORING, ED: Glucose-Capillary: 120 mg/dL — ABNORMAL HIGH (ref 65–99)

## 2015-10-28 MED ORDER — SODIUM CHLORIDE 0.9 % IV BOLUS (SEPSIS)
500.0000 mL | Freq: Once | INTRAVENOUS | Status: AC
  Administered 2015-10-28: 500 mL via INTRAVENOUS

## 2015-10-28 MED ORDER — SODIUM CHLORIDE 0.9 % IV SOLN
100.0000 mL/h | INTRAVENOUS | Status: DC
Start: 2015-10-28 — End: 2015-10-31
  Administered 2015-10-28: 100 mL/h via INTRAVENOUS

## 2015-10-28 MED ORDER — CLONAZEPAM 1 MG PO TABS
1.0000 mg | ORAL_TABLET | Freq: Once | ORAL | Status: AC
  Administered 2015-10-28: 1 mg via ORAL
  Filled 2015-10-28: qty 2

## 2015-10-28 NOTE — ED Notes (Signed)
Initial NIH 2204, last neuro check at 2248. Pt passed swallow screen, klonopin 1mg  given per request. Continues to have R sided arm and leg drift with intermittent aphasia. Call with questions if needed [place] [phone]

## 2015-10-28 NOTE — H&P (Signed)
History and Physical    KRISTIN DAISEY RUE:[id number] DOB: 05/22/1960 DOA: 10/28/2015  PCP: Gaye Alken, MD   Patient coming from: Home.  Chief Complaint: Right-sided weakness.  HPI: ONELIA ZEIEN is a 55 y.o. female with medical history significant of admitted with type 2 diabetes without complication, mild bipolar disorder, allergic rhinitis, asthma, hyperlipidemia, who was recently admitted for CVA on 09/29/2015 and comes with recurrent right-sided weakness.  Per patient and her husband, they were playing cards, and when he was the patient's turned to play they noticed blank stare in her face. The patient states that she was able to understand what she was told, but was unable to articulate words. Her right sided extremities were weak and numb. EMS was called and arrived about 3-1/2 minutes later per patient's husband. She denies headache, blurred or double vision, chest pain, palpitations, dizziness or diaphoresis.  Patient underwent significant workup during her last admission. She also had a TEE and Manuel loop recorder device insertion post discharge.  ED Course: CT scan of the head did not show any acute abnormalities. Since arrival to the emergency department the patient's symptoms have improved significantly.   Review of Systems: As per HPI otherwise 10 point review of systems negative.   Past Medical History:  Diagnosis Date  . Asthma   . Diabetes mellitus without complication (HCC)   . Mild bipolar disorder (HCC)   . Prediabetes   . Seasonal allergic rhinitis   . Stroke (HCC)   . Vitamin D deficiency     Past Surgical History:  Procedure Laterality Date  . ABDOMINAL HYSTERECTOMY    . BLADDER SUSPENSION    . EP IMPLANTABLE DEVICE N/A 10/03/2015   Procedure: Loop Recorder Insertion;  Surgeon: Marinus Maw, MD;  Location: [facility] INVASIVE CV LAB;  Service: Cardiovascular;  Laterality: N/A;  . TEE WITHOUT CARDIOVERSION N/A 10/03/2015   Procedure:  TRANSESOPHAGEAL ECHOCARDIOGRAM (TEE);  Surgeon: Vesta Mixer, MD;  Location: [facility] ENDOSCOPY;  Service: Cardiovascular;  Laterality: N/A;     reports that she has never smoked. She has never used smokeless tobacco. She reports that she drinks about 0.6 oz of alcohol per week . She reports that she does not use drugs.  Allergies  Allergen Reactions  . Benadryl [Diphenhydramine Hcl] Other (See Comments)    Heartpounding, panic attack  . Imitrex [Sumatriptan] Other (See Comments)    Heartpounding, panic attack  . Iodine Other (See Comments)    IVP test problems  . Ivp Dye [Iodinated Diagnostic Agents] Other (See Comments)    IVP test problems  . Lobster [Shellfish Allergy] Itching  . Lamictal [Lamotrigine] Rash  . Tetracyclines & Related Rash    Blisters down the throat  . Theophyllines Rash    Target rash    Family History  Problem Relation Age of Onset  . Adopted: Yes    Prior to Admission medications   Medication Sig Start Date End Date Taking? Authorizing Provider  aspirin 325 MG tablet Take 1 tablet (325 mg total) by mouth daily. 09/27/15  Yes Catarina Hartshorn, MD  atorvastatin (LIPITOR) 80 MG tablet Take 1 tablet (80 mg total) by mouth daily at 6 PM. 09/27/15  Yes Catarina Hartshorn, MD  citalopram (CELEXA) 20 MG tablet Take 20 mg by mouth daily.   Yes Historical Provider, MD  clonazePAM (KLONOPIN) 0.5 MG tablet Take 0.5 mg by mouth daily as needed for anxiety.  06/24/15  Yes Historical Provider, MD  esomeprazole (NEXIUM) 20 MG capsule  Take 20 mg by mouth daily as needed (for heartburn or acid reflux).  06/24/15  Yes Historical Provider, MD    Physical Exam: Vitals:   10/28/15 2201  BP: 114/76  Pulse: 86  Resp: 16  Temp: 97.8 F (36.6 C)  TempSrc: Oral  SpO2: 96%  Weight: 87.7 kg (193 lb 5.5 oz)      Constitutional: NAD, calm, comfortable Vitals:   10/28/15 2201  BP: 114/76  Pulse: 86  Resp: 16  Temp: 97.8 F (36.6 C)  TempSrc: Oral  SpO2: 96%  Weight: 87.7 kg (193 lb 5.5  oz)   Eyes: PERRL, lids and conjunctivae normal ENMT: Mucous membranes are moist. Posterior pharynx clear of any exudate or lesions.Normal dentition.  Neck: normal, supple, no masses, no thyromegaly Respiratory: clear to auscultation bilaterally, no wheezing, no crackles. Normal respiratory effort. No accessory muscle use.  Cardiovascular: Regular rate and rhythm, no murmurs / rubs / gallops. No extremity edema. 2+ pedal pulses. No carotid bruits.  Abdomen: no tenderness, no masses palpated. No hepatosplenomegaly. Bowel sounds positive.  Musculoskeletal: no clubbing / cyanosis. Good ROM, no contractures. Normal muscle tone.  Skin: no rashes, lesions, ulcers. No induration Neurologic: CN 2-12 grossly intact. Speech is mildly slurred, 4/5 right sided hemiparesis Psychiatric: Normal judgment and insight. Alert and oriented x 4. Mildly anxious mood.     Labs on Admission: I have personally reviewed following labs and imaging studies  CBC:  Recent Labs Lab 10/28/15 2148 10/28/15 2215  WBC  --  10.8*  NEUTROABS  --  7.3  HGB 13.6 13.2  HCT 40.0 40.2  MCV  --  93.5  PLT  --  221   Basic Metabolic Panel:  Recent Labs Lab 10/28/15 2148  NA 141  K 4.1  CL 106  GLUCOSE 118*  BUN 21*  CREATININE 0.80   GFR: Estimated Creatinine Clearance: 84.2 mL/min (by C-G formula based on SCr of 0.8 mg/dL).  Coagulation Profile:  Recent Labs Lab 10/28/15 2215  INR 1.04   CBG:  Recent Labs Lab 10/28/15 2142  GLUCAP 120*   Urine analysis:    Component Value Date/Time   COLORURINE YELLOW 04/24/2014 1435   APPEARANCEUR CLOUDY (A) 04/24/2014 1435   LABSPEC 1.019 04/24/2014 1435   PHURINE 5.0 04/24/2014 1435   GLUCOSEU NEGATIVE 04/24/2014 1435   HGBUR NEGATIVE 04/24/2014 1435   BILIRUBINUR NEGATIVE 04/24/2014 1435   KETONESUR NEGATIVE 04/24/2014 1435   PROTEINUR NEGATIVE 04/24/2014 1435   UROBILINOGEN 0.2 04/24/2014 1435   NITRITE NEGATIVE 04/24/2014 1435   LEUKOCYTESUR  NEGATIVE 04/24/2014 1435    Radiological Exams on Admission: Ct Head Code Stroke W/o Cm  Result Date: 10/28/2015 CLINICAL DATA:  55 year old female with right-sided weakness and ectasia. Code stroke. EXAM: CT HEAD WITHOUT CONTRAST TECHNIQUE: Contiguous axial images were obtained from the base of the skull through the vertex without intravenous contrast. COMPARISON:  Head CT dated 09/29/2015 an MRI dated 09/29/2015 FINDINGS: The ventricles and sulci appropriate size for patient's age. Minimal periventricular and deep white matter chronic microvascular ischemic changes may be present. There is no acute intracranial hemorrhage. No mass effect or midline shift. There is mild mucoperiosteal thickening of paranasal sinuses. No air-fluid levels. The mastoid air cells are clear. The calvarium is intact. IMPRESSION: No acute intracranial pathology If symptoms persist and there are no contraindications, MRI may provide better evaluation if clinically indicated These results were called by telephone at the time of interpretation on 10/28/2015 at 9:54 pm to Dr.Stewart, who verbally  acknowledged these results. Electronically Signed   By: Elgie Collard M.D.   On: 10/28/2015 21:55 10/03/2015 Echo TEE  ------------------------------------------------------------------- LV EF: 60% -   65%  ------------------------------------------------------------------- Indications:      CVA 436.  ------------------------------------------------------------------- History:   Risk factors:  Dyslipidemia.  ------------------------------------------------------------------- Study Conclusions  - Left ventricle: Systolic function was normal. The estimated   ejection fraction was in the range of 60% to 65%. - Aortic valve: No evidence of vegetation. - Mitral valve: No evidence of vegetation. - Left atrium: No evidence of thrombus in the atrial cavity or   appendage. - Atrial septum: No defect or patent foramen ovale  was identified   Echocardiogram 09/26/2015  Indications:      CVA 436.  ------------------------------------------------------------------- History:   PMH:  Hyperglycemia. Asthma.  ------------------------------------------------------------------- Study Conclusions  - Left ventricle: The cavity size was normal. Systolic function was   normal. The estimated ejection fraction was in the range of 55%   to 60%. Wall motion was normal; there were no regional wall   motion abnormalities. Doppler parameters are consistent with   abnormal left ventricular relaxation (grade 1 diastolic   dysfunction). There was no evidence of elevated ventricular   filling pressure by Doppler parameters. - Aortic valve: There was no regurgitation. - Mitral valve: Structurally normal valve. There was trivial   regurgitation. - Left atrium: The atrium was mildly dilated. - Right ventricle: Systolic function was normal. - Right atrium: The atrium was normal in size. - Tricuspid valve: There was mild regurgitation. - Pulmonic valve: There was no regurgitation. - Pulmonary arteries: Systolic pressure was within the normal   range. - Pericardium, extracardiac: The pericardium was normal in   appearance.  Impressions:  - No cardiac source of emboli was indentified.   EKG: Independently reviewed. Vent. rate 82 BPM PR interval * ms QRS duration 87 ms QT/QTc 392/458 ms P-R-T axes 49 2 42 Sinus rhythm T wave abnormality Abnormal ekg  Assessment/Plan Principal Problem:   TIA (transient ischemic attack) versus CVA.  Admit to telemetry/observation. Frequent neuro checks. Continue aspirin 325 mg by mouth daily. PT and OT evaluation. Hemoglobin A1c fasting lipids done recently. Echocardiogram and carotid Doppler studies done on recent admission. Check MRI/MRA to brain. Neurology is following.  Active Problems:    recent Stroke (cerebrum) (HCC) Felt to be embolic in nature, TEE was  negative. Continue antiplatelet agents. Continue risk factor modification. Further management per neuro.    Hyperlipidemia Lipitor 80 mg once cleared for oral intake.    Abnormal liver function test Check CMP in a.m.    Asthma Asymptomatic now. Supplemental oxygen as needed. Bronchodilators as needed.    Bipolar disorder (HCC)  Celexa 20 mg by mouth daily.  Clonazepam as needed for anxiety.    Prediabetes Carbohydrate modified diet. Monitor blood glucose.   DVT prophylaxis: Lovenox SQ. Code Status: Full code. Family Communication: Her spouse is present in the room. Disposition Plan: Admit to telemetry for further evaluation, MRI, MRA, and neurology follow-up. Consults called: Neurology (Dr. Noel Christmas). Admission status: Telemetry/inpatient   Bobette Mo MD [facility] Pager [phone].  If 7PM-7AM, please contact night-coverage [email or web address] Password [facility]  10/28/2015, 10:41 PM

## 2015-10-28 NOTE — Code Documentation (Signed)
Code stroke called at 2129 for this pt who had a prior stroke 4 weeks ago with left side deficits.  PT was LSW at 2050 while   playing a card game with family and friends. At 2051 pt had sudden onset RUE numbness and weakness,  RLE weakness and difficulty formulating her words.  [facility] was called and pt arrived at [facility] at 2139 hrs where she was immediately cleared for CT by Dr Jeraldine Loots, arriving at CT at 2140.  Her CBG was 151.  CT results of no acute intracranial abnormalities were called to Dr Roseanne Reno at 2154 hrs.  On return to [facility] pt scored a 6 on the NIHSS with points given for right arm drift, right leg drift, ataxia in two limbs, partial sensory loss and mild to moderate dysarthria. TPA not indicated due to prior CVA 4 weeks ago.  Pt also has a loop recorder placed on a prior admission.  To be admitted to medicine service

## 2015-10-28 NOTE — ED Triage Notes (Signed)
Pt to ED by [facility] for Code Stroke. Pt had a CVA 4 weeks ago with L sided deficits. LSN 2050, pt had R sided facial numbness, R arm and R leg drift. Pt also having difficulty speaking. On previous admission, pt had a loop recorder placed. Bilateral 18g IVs to [facility]. CBG 151.

## 2015-10-28 NOTE — ED Notes (Signed)
Admitting MD at bedside.

## 2015-10-28 NOTE — Consult Note (Signed)
Admission H&P    Chief Complaint: Recurrent right side weakness.  HPI: Jenna Delgado is an 55 y.o. female with a history of diabetes mellitus, hyperlipidemia and recurrent stroke 4 weeks ago, presenting with new onset right-sided weakness involving upper extremity primarily, along with mild expressive aphasia. She's been taking aspirin 325 mg per day. Previous workup, including echocardiogram and carotid Doppler study as well as hypercoagulopathy panel, his been unremarkable. CT scan of her head today showed no acute intracranial abnormality. She had mild drift of right upper and lower extremities as well as slight coordination difficulty of upper extremities all speech abnormality. NIH stroke score was 6. Patient was not deemed a candidate for TPA because of recent see of stroke.  LSN: 8:50 PM on 10/28/2015 tPA Given: No: As noted above mRankin:  Past Medical History:  Diagnosis Date  . Asthma   . Diabetes mellitus without complication (HCC)   . Mild bipolar disorder (HCC)   . Prediabetes   . Seasonal allergic rhinitis   . Stroke (HCC)   . Vitamin D deficiency     Past Surgical History:  Procedure Laterality Date  . ABDOMINAL HYSTERECTOMY    . BLADDER SUSPENSION    . EP IMPLANTABLE DEVICE N/A 10/03/2015   Procedure: Loop Recorder Insertion;  Surgeon: Marinus Maw, MD;  Location: [facility];  Service: Cardiovascular;  Laterality: N/A;  . TEE WITHOUT CARDIOVERSION N/A 10/03/2015   Procedure: TRANSESOPHAGEAL ECHOCARDIOGRAM (TEE);  Surgeon: Vesta Mixer, MD;  Location: [facility] ENDOSCOPY;  Service: Cardiovascular;  Laterality: N/A;    Family History  Problem Relation Age of Onset  . Adopted: Yes   Social History:  reports that she has never smoked. She has never used smokeless tobacco. She reports that she drinks about 0.6 oz of alcohol per week . She reports that she does not use drugs.  Allergies:  Allergies  Allergen Reactions  . Benadryl [Diphenhydramine Hcl] Other (See  Comments)    Heartpounding, panic attack  . Imitrex [Sumatriptan] Other (See Comments)    Heartpounding, panic attack  . Iodine Other (See Comments)    IVP test problems  . Ivp Dye [Iodinated Diagnostic Agents] Other (See Comments)    IVP test problems  . Lobster [Shellfish Allergy] Itching  . Lamictal [Lamotrigine] Rash  . Tetracyclines & Related Rash    Blisters down the throat  . Theophyllines Rash    Target rash    Medications: Preadmission medications were reviewed by me.  ROS: History obtained from the patient  General ROS: negative for - chills, fatigue, fever, night sweats, weight gain or weight loss Psychological ROS: negative for - behavioral disorder, hallucinations, memory difficulties, mood swings or suicidal ideation Ophthalmic ROS: negative for - blurry vision, double vision, eye pain or loss of vision ENT ROS: negative for - epistaxis, nasal discharge, oral lesions, sore throat, tinnitus or vertigo Allergy and Immunology ROS: negative for - hives or itchy/watery eyes Hematological and Lymphatic ROS: negative for - bleeding problems, bruising or swollen lymph nodes Endocrine ROS: negative for - galactorrhea, hair pattern changes, polydipsia/polyuria or temperature intolerance Respiratory ROS: negative for - cough, hemoptysis, shortness of breath or wheezing Cardiovascular ROS: negative for - chest pain, dyspnea on exertion, edema or irregular heartbeat Gastrointestinal ROS: negative for - abdominal pain, diarrhea, hematemesis, nausea/vomiting or stool incontinence Genito-Urinary ROS: negative for - dysuria, hematuria, incontinence or urinary frequency/urgency Musculoskeletal ROS: negative for - joint swelling or muscular weakness Neurological ROS: as noted in HPI Dermatological ROS:  negative for rash and skin lesion changes  Physical Examination: Blood pressure 114/76, pulse 86, temperature 97.8 F (36.6 C), temperature source Oral, resp. rate 16, weight 87.7 kg  (193 lb 5.5 oz), SpO2 96 %.  HEENT-  Normocephalic, no lesions, without obvious abnormality.  Normal external eye and conjunctiva.  Normal TM's bilaterally.  Normal auditory canals and external ears. Normal external nose, mucus membranes and septum.  Normal pharynx. Neck supple with no masses, nodes, nodules or enlargement. Cardiovascular - regular rate and rhythm, S1, S2 normal, no murmur, click, rub or gallop Lungs - chest clear, no wheezing, rales, normal symmetric air entry Abdomen - soft, non-tender; bowel sounds normal; no masses,  no organomegaly Extremities - no joint deformities, effusion, or inflammation and no edema  Neurologic Examination: Mental Status: Alert, oriented, thought content appropriate.  Slight word finding difficulty was noted. Able to follow commands without difficulty. Cranial Nerves: II-Visual fields were normal. III/IV/VI-Pupils were equal and reacted normally to light. Extraocular movements were full and conjugate.    V/VII-no facial numbness and no facial weakness. VIII-normal. X-minimal dysarthria. XI: trapezius strength/neck flexion strength normal bilaterally XII-midline tongue extension with normal strength. Motor: Mild drift of right upper and lower extremities; motor exam otherwise unremarkable. Sensory: Normal throughout. Deep Tendon Reflexes: 2+ and symmetric. Plantars: Flexor bilaterally Cerebellar: Mild coordination difficulty of right extremities. Carotid auscultation: Normal  Results for orders placed or performed during the hospital encounter of 10/28/15 (from the past 48 hour(s))  CBG monitoring, ED     Status: Abnormal   Collection Time: 10/28/15  9:42 PM  Result Value Ref Range   Glucose-Capillary 120 (H) 65 - 99 mg/dL  I-stat troponin, ED     Status: None   Collection Time: 10/28/15  9:47 PM  Result Value Ref Range   Troponin i, poc 0.00 0.00 - 0.08 ng/mL   Comment 3            Comment: Due to the release kinetics of cTnI, a  negative result within the first hours of the onset of symptoms does not rule out myocardial infarction with certainty. If myocardial infarction is still suspected, repeat the test at appropriate intervals.   I-Stat Chem 8, ED     Status: Abnormal   Collection Time: 10/28/15  9:48 PM  Result Value Ref Range   Sodium 141 135 - 145 mmol/L   Potassium 4.1 3.5 - 5.1 mmol/L   Chloride 106 101 - 111 mmol/L   BUN 21 (H) 6 - 20 mg/dL   Creatinine, Ser 2.95 0.44 - 1.00 mg/dL   Glucose, Bld 621 (H) 65 - 99 mg/dL   Calcium, Ion 3.08 (L) 1.13 - 1.30 mmol/L   TCO2 22 0 - 100 mmol/L   Hemoglobin 13.6 12.0 - 15.0 g/dL   HCT 65.7 84.6 - 96.2 %   Ct Head Code Stroke W/o Cm  Result Date: 10/28/2015 CLINICAL DATA:  55 year old female with right-sided weakness and ectasia. Code stroke. EXAM: CT HEAD WITHOUT CONTRAST TECHNIQUE: Contiguous axial images were obtained from the base of the skull through the vertex without intravenous contrast. COMPARISON:  Head CT dated 09/29/2015 an MRI dated 09/29/2015 FINDINGS: The ventricles and sulci appropriate size for patient's age. Minimal periventricular and deep white matter chronic microvascular ischemic changes may be present. There is no acute intracranial hemorrhage. No mass effect or midline shift. There is mild mucoperiosteal thickening of paranasal sinuses. No air-fluid levels. The mastoid air cells are clear. The calvarium is intact. IMPRESSION: No  acute intracranial pathology If symptoms persist and there are no contraindications, MRI may provide better evaluation if clinically indicated These results were called by telephone at the time of interpretation on 10/28/2015 at 9:54 pm to Dr.Loyalty Arentz, who verbally acknowledged these results. Electronically Signed   By: Elgie Collard M.D.   On: 10/28/2015 21:55   Assessment: 55 y.o. female with multiple risk factors for stroke as well as previous stroke presenting with a recurrent left subcortical ischemic event, MCA  territory, TIA or possible recurrent stroke.  Stroke Risk Factors - diabetes mellitus and hyperlipidemia  Plan: 1. HgbA1c, fasting lipid panel 2. MRI of the brain without contrast 3. PT consult 4. Prophylactic therapy-Antiplatelet med: Aspirin  5. Risk factor modification 6. Telemetry monitoring  C.R. Roseanne Reno, MD [facility] [phone]  10/28/2015, 10:14 PM

## 2015-10-28 NOTE — ED Notes (Signed)
Speech appears to be improving; expressive aphasia noted at times. Pt continues to have drift in R arm and leg. Reports numbness feeling in R arm is improving, still present

## 2015-10-28 NOTE — ED Provider Notes (Signed)
[facility]-EMERGENCY DEPT Provider Note   CSN: [id number] Arrival date & time: 10/28/15  2139  First Provider Contact:  First MD Initiated Contact with Patient 10/28/15 2143        History   Chief Complaint Chief Complaint  Patient presents with  . Code Stroke    HPI Jenna Delgado is a 55 y.o. female.  HPI patient presents with concern of new right-sided numbness. Numbness is in the face and arm, with weakness in the right hand. Symptoms began about one hour prior to ED arrival. Patient also complains of speech difficulty, with difficulty expressing words, but seems to acknowledge that she can think of words appropriately. She has a notable history of stroke, diagnosed 4 weeks ago. She was discharged with aspirin, statin. She notes that she was well in the interval. She denies current pain. Patient presents to EMS, who assists with the history of present illness.   Past Medical History:  Diagnosis Date  . Asthma   . Diabetes mellitus without complication (HCC)   . Mild bipolar disorder (HCC)   . Prediabetes   . Seasonal allergic rhinitis   . Stroke (HCC)   . Vitamin D deficiency     Patient Active Problem List   Diagnosis Date Noted  . TIA (transient ischemic attack) 10/28/2015  . Cerebrovascular accident (CVA) (HCC)   . Abnormal LFTs   . Cerebral infarction due to unspecified mechanism   . Acute CVA (cerebrovascular accident) (HCC) 09/29/2015  . Abnormal liver function test 09/29/2015  . Asthma 09/29/2015  . Bipolar disorder (HCC) 09/29/2015  . Hyperlipidemia 09/26/2015  . Stroke (cerebrum) (HCC) 09/25/2015  . Hyperglycemia 09/25/2015  . Acute ischemic stroke [facility])     Past Surgical History:  Procedure Laterality Date  . ABDOMINAL HYSTERECTOMY    . BLADDER SUSPENSION    . EP IMPLANTABLE DEVICE N/A 10/03/2015   Procedure: Loop Recorder Insertion;  Surgeon: Marinus Maw, MD;  Location: [facility] IN[facility];  Service: Cardiovascular;  Laterality: N/A;  . TEE  WITHOUT CARDIOVERSION N/A 10/03/2015   Procedure: TRANSESOPHAGEAL ECHOCARDIOGRAM (TEE);  Surgeon: Vesta Mixer, MD;  Location[facility] ENDOSCOPY;  Service: Cardiovascular;  Laterality: N/A;    OB History    No data available       Home Medications    Prior to Admission medications   Medication Sig Start Date End Date Taking? Authorizing Provider  aspirin 325 MG tablet Take 1 tablet (325 mg total) by mouth daily. 09/27/15  Yes Catarina Hartshorn, MD  atorvastatin (LIPITOR) 80 MG tablet Take 1 tablet (80 mg total) by mouth daily at 6 PM. 09/27/15  Yes Catarina Hartshorn, MD  citalopram (CELEXA) 20 MG tablet Take 20 mg by mouth daily.   Yes Historical Provider, MD  clonazePAM (KLONOPIN) 0.5 MG tablet Take 0.5 mg by mouth daily as needed for anxiety.  06/24/15  Yes Historical Provider, MD  esomeprazole (NEXIUM) 20 MG capsule Take 20 mg by mouth daily as needed (for heartburn or acid reflux).  06/24/15  Yes Historical Provider, MD    Family History Family History  Problem Relation Age of Onset  . Adopted: Yes    Social History Social History  Substance Use Topics  . Smoking status: Never Smoker  . Smokeless tobacco: Never Used  . Alcohol use 0.6 oz/week    1 Standard drinks or equivalent per week     Comment: occasionally     Allergies   Benadryl [diphenhydramine hcl]; Imitrex [sumatriptan]; Iodine; Ivp dye [iodinated  diagnostic agents]; Lobster [shellfish allergy]; Lamictal [lamotrigine]; Tetracyclines & related; and Theophyllines   Review of Systems Review of Systems  Constitutional:       Per HPI, otherwise negative  HENT:       Per HPI, otherwise negative  Respiratory:       Per HPI, otherwise negative  Cardiovascular:       Per HPI, otherwise negative  Gastrointestinal: Negative for vomiting.  Endocrine:       Negative aside from HPI  Genitourinary:       Neg aside from HPI   Musculoskeletal:       Per HPI, otherwise negative  Skin: Negative.   Neurological: Positive for speech  difficulty, weakness and numbness. Negative for syncope and facial asymmetry.     Physical Exam Updated Vital Signs BP 115/71   Pulse 67   Temp 97.9 F (36.6 C)   Resp 15   Wt 193 lb 5.5 oz (87.7 kg) Comment: bedscale  SpO2 98%   BMI 33.19 kg/m   Physical Exam  Constitutional: She is oriented to person, place, and time. She appears well-developed and well-nourished. No distress.  HENT:  Head: Normocephalic and atraumatic.  Eyes: Conjunctivae and EOM are normal.  Cardiovascular: Normal rate and regular rhythm.   Pulmonary/Chest: Effort normal and breath sounds normal. No stridor. No respiratory distress.  Abdominal: She exhibits no distension.  Musculoskeletal: She exhibits no edema.  Neurological: She is alert and oriented to person, place, and time. A cranial nerve deficit is present.  Grip strength seems appropriate bilaterally, patient moves all extremities spontaneously. No facial asymmetry, speech has a notable delay, with expressive aphasia obvious, but the patient is appropriate orientation  Skin: Skin is warm and dry.  Psychiatric: She has a normal mood and affect.  Nursing note and vitals reviewed.    ED Treatments / Results  Labs (all labs ordered are listed, but only abnormal results are displayed) Labs Reviewed  CBC WITH DIFFERENTIAL/PLATELET - Abnormal; Notable for the following:       Result Value   WBC 10.8 (*)    All other components within normal limits  CBG MONITORING, ED - Abnormal; Notable for the following:    Glucose-Capillary 120 (*)    All other components within normal limits  I-STAT CHEM 8, ED - Abnormal; Notable for the following:    BUN 21 (*)    Glucose, Bld 118 (*)    Calcium, Ion 1.11 (*)    All other components within normal limits  PROTIME-INR  APTT  ETHANOL  CBC  DIFFERENTIAL  COMPREHENSIVE METABOLIC PANEL  TROPONIN I  URINE RAPID DRUG SCREEN, HOSP PERFORMED  URINALYSIS, ROUTINE [facility]  Rosezena Sensor, ED    EKG  EKG Interpretation  Date/Time:  Friday October 28 2015 21:58:14 EDT Ventricular Rate:  82 PR Interval:    QRS Duration: 87 QT Interval:  392 QTC Calculation: 458 R Axis:   2 Text Interpretation:  Sinus rhythm T wave abnormality Abnormal ekg Confirmed by Gerhard Munch  MD ([id number]) on 10/28/2015 10:08:07 PM       Radiology Ct Head Code Stroke W/o Cm  Result Date: 10/28/2015 CLINICAL DATA:  55 year old female with right-sided weakness and ectasia. Code stroke. EXAM: CT HEAD WITHOUT CONTRAST TECHNIQUE: Contiguous axial images were obtained from the base of the skull through the vertex without intravenous contrast. COMPARISON:  Head CT dated 09/29/2015 an MRI dated 09/29/2015 FINDINGS: The ventricles and sulci appropriate size for  patient's age. Minimal periventricular and deep white matter chronic microvascular ischemic changes may be present. There is no acute intracranial hemorrhage. No mass effect or midline shift. There is mild mucoperiosteal thickening of paranasal sinuses. No air-fluid levels. The mastoid air cells are clear. The calvarium is intact. IMPRESSION: No acute intracranial pathology If symptoms persist and there are no contraindications, MRI may provide better evaluation if clinically indicated These results were called by telephone at the time of interpretation on 10/28/2015 at 9:54 pm to Dr.Stewart, who verbally acknowledged these results. Electronically Signed   By: Elgie Collard M.D.   On: 10/28/2015 21:55   Procedures Procedures (including critical care time)  Medications Ordered in ED Medications  sodium chloride 0.9 % bolus 500 mL (500 mLs Intravenous New Bag/Given 10/28/15 2246)    Followed by  0.9 %  sodium chloride infusion (not administered)  clonazePAM (KLONOPIN) tablet 1 mg (not administered)     Initial Impression / Assessment and Plan / ED Course  I have reviewed the triage vital signs and the nursing notes.  Pertinent labs &  imaging results that were available during my care of the patient were reviewed by me and considered in my medical decision making (see chart for details).  Clinical Course    On repeat exam the patient appears in similar condition, with persistent expressive aphasia. I discussed her case with our neurologist, and subsequently discussed all findings with the patient and multiple family members. Patient presents 4 weeks after recent diagnosis of stroke, now with concern for new expressive aphasia, right facial numbness. Here, the patient's objective neurologic exam demonstrates persistent speech deficit, but is otherwise normal. Initial CT scan unremarkable, but the patient required admission for further evaluation, management.  Final Clinical Impressions(s) / ED Diagnoses  New Prescriptions New Prescriptions   No medications on file     Gerhard Munch, MD 10/28/15 2312

## 2015-10-29 ENCOUNTER — Observation Stay (HOSPITAL_COMMUNITY): Payer: BLUE CROSS/BLUE SHIELD

## 2015-10-29 DIAGNOSIS — G458 Other transient cerebral ischemic attacks and related syndromes: Secondary | ICD-10-CM

## 2015-10-29 DIAGNOSIS — E785 Hyperlipidemia, unspecified: Secondary | ICD-10-CM | POA: Diagnosis not present

## 2015-10-29 LAB — RAPID URINE DRUG SCREEN, HOSP PERFORMED
AMPHETAMINES: NOT DETECTED
BARBITURATES: NOT DETECTED
Benzodiazepines: NOT DETECTED
COCAINE: NOT DETECTED
OPIATES: NOT DETECTED
TETRAHYDROCANNABINOL: POSITIVE — AB

## 2015-10-29 LAB — CBC
HCT: 38.2 % (ref 36.0–46.0)
Hemoglobin: 12.5 g/dL (ref 12.0–15.0)
MCH: 30.8 pg (ref 26.0–34.0)
MCHC: 32.7 g/dL (ref 30.0–36.0)
MCV: 94.1 fL (ref 78.0–100.0)
PLATELETS: 195 10*3/uL (ref 150–400)
RBC: 4.06 MIL/uL (ref 3.87–5.11)
RDW: 13.8 % (ref 11.5–15.5)
WBC: 9.6 10*3/uL (ref 4.0–10.5)

## 2015-10-29 LAB — URINALYSIS, ROUTINE W REFLEX MICROSCOPIC
BILIRUBIN URINE: NEGATIVE
GLUCOSE, UA: NEGATIVE mg/dL
HGB URINE DIPSTICK: NEGATIVE
KETONES UR: NEGATIVE mg/dL
Leukocytes, UA: NEGATIVE
Nitrite: NEGATIVE
PH: 5 (ref 5.0–8.0)
PROTEIN: NEGATIVE mg/dL
Specific Gravity, Urine: 1.02 (ref 1.005–1.030)

## 2015-10-29 LAB — CREATININE, SERUM
Creatinine, Ser: 0.75 mg/dL (ref 0.44–1.00)
GFR calc Af Amer: >60 mL/min (ref >60)
GFR calc non Af Amer: >60 mL/min (ref >60)

## 2015-10-29 MED ORDER — SODIUM CHLORIDE 0.9% FLUSH
3.0000 mL | Freq: Two times a day (BID) | INTRAVENOUS | Status: DC
  Administered 2015-10-29 – 2015-10-31 (×5): 3 mL via INTRAVENOUS

## 2015-10-29 MED ORDER — ACETAMINOPHEN 325 MG PO TABS
650.0000 mg | ORAL_TABLET | Freq: Once | ORAL | Status: AC
  Administered 2015-10-29: 650 mg via ORAL
  Filled 2015-10-29: qty 2

## 2015-10-29 MED ORDER — STROKE: EARLY STAGES OF RECOVERY BOOK
Freq: Once | Status: AC
  Administered 2015-10-29: 08:00:00

## 2015-10-29 MED ORDER — ACETAMINOPHEN 325 MG PO TABS
650.0000 mg | ORAL_TABLET | ORAL | Status: DC | PRN
Start: 2015-10-29 — End: 2015-10-31
  Administered 2015-10-29 – 2015-10-30 (×2): 650 mg via ORAL
  Filled 2015-10-29 (×2): qty 2

## 2015-10-29 MED ORDER — PANTOPRAZOLE SODIUM 40 MG PO TBEC
40.0000 mg | DELAYED_RELEASE_TABLET | Freq: Every day | ORAL | Status: DC
  Administered 2015-10-29 – 2015-10-31 (×3): 40 mg via ORAL
  Filled 2015-10-29 (×3): qty 1

## 2015-10-29 MED ORDER — ASPIRIN 325 MG PO TABS
325.0000 mg | ORAL_TABLET | Freq: Every day | ORAL | Status: DC
  Administered 2015-10-29 – 2015-10-31 (×3): 325 mg via ORAL
  Filled 2015-10-29 (×3): qty 1

## 2015-10-29 MED ORDER — CLONAZEPAM 0.5 MG PO TABS
0.5000 mg | ORAL_TABLET | Freq: Every day | ORAL | Status: DC | PRN
  Administered 2015-10-29 – 2015-10-30 (×3): 0.5 mg via ORAL
  Filled 2015-10-29 (×3): qty 1

## 2015-10-29 MED ORDER — CITALOPRAM HYDROBROMIDE 10 MG PO TABS
20.0000 mg | ORAL_TABLET | Freq: Every day | ORAL | Status: DC
Start: 2015-10-29 — End: 2015-10-31
  Administered 2015-10-29 – 2015-10-31 (×3): 20 mg via ORAL
  Filled 2015-10-29 (×3): qty 2

## 2015-10-29 MED ORDER — ENOXAPARIN SODIUM 40 MG/0.4ML ~~LOC~~ SOLN
40.0000 mg | SUBCUTANEOUS | Status: DC
  Administered 2015-10-29 – 2015-10-30 (×2): 40 mg via SUBCUTANEOUS
  Filled 2015-10-29 (×2): qty 0.4

## 2015-10-29 MED ORDER — ATORVASTATIN CALCIUM 80 MG PO TABS
80.0000 mg | ORAL_TABLET | Freq: Every day | ORAL | Status: DC
  Administered 2015-10-29 – 2015-10-30 (×2): 80 mg via ORAL
  Filled 2015-10-29 (×2): qty 1

## 2015-10-29 NOTE — Progress Notes (Signed)
PROGRESS NOTE                                                                                                                                                                                                             Patient Demographics:    Jenna Delgado, is a 55 y.o. female, DOB - 1960/06/07, ZOX:[id number]  Admit date - 10/28/2015   Admitting Physician Bobette Mo, MD  Outpatient Primary MD for the patient is Gaye Alken, MD  LOS - 0  Outpatient Specialists:None  Chief Complaint  Patient presents with  . Code Stroke       Brief Narrative   55 year old female with recent embolic cerebellar stroke with implantable loop recorder placed about 2 weeks back, asthma, bipolar disorder presented with acute onset of right-sided weakness and numbness associated with difficulty articulating words.. Admitted for TIA/ischemic stroke workup.   Subjective:   Patient denies any  Assessment  & Plan :    Principal Problem:   TIA (transient ischemic attack)Versus ischemic stroke Respiratory include diabetes mellitus, hyperlipidemia and recent cerebellar stroke. Minimal weakness on right side. Ordered MRI of the brain. Continue full dose aspirin. PT/OT, SLP evaluation. Recent lipid panel normal. A1c of 6. Given recent 2-D echo and carotids, no repeat test ordered. Patient has an implantable loop recorder placed about 2 weeks back. TEE was done at that time which was unremarkable as well. Urine drug screen is positive for marijuana. Needs counseling on cessation. Further recommendations per neurology.  Active Problems:  Type 2 diabetes mellitus Well controlled. A1c of 6. Monitor on sliding scale coverage.  Asthma Stable.  Bipolar disorder Continue Celexa       Code Status : Full code  Family Communication  : None at bedside  Disposition Plan  : Home once clinically improved and workup completed.  Possibly on 7/30  Barriers For Discharge : Pending workup  Consults  :  Neurology  Procedures  :  CT head  DVT Prophylaxis  :  Lovenox   Lab Results  Component Value Date   PLT 195 10/29/2015    Antibiotics  :   Anti-infectives    None        Objective:   Vitals:   10/29/15 0450 10/29/15 0634 10/29/15 0800 10/29/15 1000  BP: (!) 99/55 103/62 104/64 104/60  Pulse: 64 69 70  69  Resp: 16 16 16 16   Temp:  98.1 F (36.7 C) 98.3 F (36.8 C) 98.4 F (36.9 C)  TempSrc: Oral Oral Oral Oral  SpO2: 99% 97% 98% 98%  Weight:        Wt Readings from Last 3 Encounters:  10/28/15 86 kg (189 lb 8 oz)  09/29/15 79.4 kg (175 lb)  09/25/15 79.4 kg (175 lb)     Intake/Output Summary (Last 24 hours) at 10/29/15 1223 Last data filed at 10/29/15 0600  Gross per 24 hour  Intake              120 ml  Output              300 ml  Net             -180 ml     Physical Exam  Gen: not in distress HEENT: no pallor, moist mucosa, supple neck Chest: clear b/l, no added sounds CVS: N S1&S2, no murmurs, rubs or gallop GI: soft, NT, ND, BS+ Musculoskeletal: warm, no edema CNS:  Alert and oriented. 4+/5 power in rt extremity    Data Review:    CBC  Recent Labs Lab 10/28/15 2148 10/28/15 2215 10/29/15 0216  WBC  --  10.8* 9.6  HGB 13.6 13.2 12.5  HCT 40.0 40.2 38.2  PLT  --  221 195  MCV  --  93.5 94.1  MCH  --  30.7 30.8  MCHC  --  32.8 32.7  RDW  --  13.9 13.8  LYMPHSABS  --  2.5  --   MONOABS  --  0.7  --   EOSABS  --  0.3  --   BASOSABS  --  0.0  --     Chemistries   Recent Labs Lab 10/28/15 2148 10/28/15 2215 10/29/15 0216  NA 141 136  --   K 4.1 4.1  --   CL 106 107  --   CO2  --  21*  --   GLUCOSE 118* 117*  --   BUN 21* 17  --   CREATININE 0.80 0.82 0.75  CALCIUM  --  9.0  --   AST  --  22  --   ALT  --  26  --   ALKPHOS  --  79  --   BILITOT  --  1.0  --     ------------------------------------------------------------------------------------------------------------------ No results for input(s): CHOL, HDL, LDLCALC, TRIG, CHOLHDL, LDLDIRECT in the last 72 hours.  Lab Results  Component Value Date   HGBA1C 6.0 (H) 09/30/2015   ------------------------------------------------------------------------------------------------------------------ No results for input(s): TSH, T4TOTAL, T3FREE, THYROIDAB in the last 72 hours.  Invalid input(s): FREET3 ------------------------------------------------------------------------------------------------------------------ No results for input(s): VITAMINB12, FOLATE, FERRITIN, TIBC, IRON, RETICCTPCT in the last 72 hours.  Coagulation profile  Recent Labs Lab 10/28/15 2215  INR 1.04    No results for input(s): DDIMER in the last 72 hours.  Cardiac Enzymes  Recent Labs Lab 10/28/15 2215  TROPONINI <0.03   ------------------------------------------------------------------------------------------------------------------ No results found for: BNP  Inpatient Medications  Scheduled Meds: . aspirin  325 mg Oral Daily  . atorvastatin  80 mg Oral q1800  . citalopram  20 mg Oral Daily  . enoxaparin (LOVENOX) injection  40 mg Subcutaneous Q24H  . pantoprazole  40 mg Oral Daily  . sodium chloride flush  3 mL Intravenous Q12H   Continuous Infusions: . sodium chloride 100 mL/hr (10/28/15 2359)   PRN Meds:.clonazePAM  Micro Results No results found  for this or any previous visit (from the past 240 hour(s)).  Radiology Reports Ct Head Wo Contrast  Result Date: 09/29/2015 CLINICAL DATA:  Slurred speech.  History of strokes. EXAM: CT HEAD WITHOUT CONTRAST TECHNIQUE: Contiguous axial images were obtained from the base of the skull through the vertex without intravenous contrast. COMPARISON:  MR brain 09/25/2015 FINDINGS: There is no evidence of mass effect, midline shift or extra-axial fluid  collections. There is no evidence of a space-occupying lesion or intracranial hemorrhage. There is no evidence of a cortical-based area of acute infarction. Small area of low attenuation in the right posterior frontal lobe consistent with a small subacute infarct. The ventricles and sulci are appropriate for the patient's age. The basal cisterns are patent. Visualized portions of the orbits are unremarkable. The visualized portions of the paranasal sinuses and mastoid air cells are unremarkable. The osseous structures are unremarkable. IMPRESSION: 1. Small stable right posterior frontal lobe subacute infarct without hemorrhagic transformation. 2. No new area of infarction. Critical Value/emergent results were called by telephone at the time of interpretation on 09/29/2015 at 4:48 pm to Dr. Cherylynn Ridges, who verbally acknowledged these results. Electronically Signed   By: Elige Ko   On: 09/29/2015 16:45   Mr Brain Wo Contrast  Result Date: 10/29/2015 CLINICAL DATA:  Acute onset of RIGHT-sided weakness. History of stroke in June affecting the RIGHT hemisphere. EXAM: MRI HEAD WITHOUT CONTRAST MRA HEAD WITHOUT CONTRAST TECHNIQUE: Multiplanar, multiecho pulse sequences of the brain and surrounding structures were obtained without intravenous contrast. Angiographic images of the head were obtained using MRA technique without contrast. COMPARISON:  MRI brain 09/29/2015. MRI brain 09/25/2015. CT head 10/28/2015. FINDINGS: MRI HEAD FINDINGS Restricted diffusion again noted in the RIGHT posterior frontal cortex and subcortical white matter, less intense compared with most recent prior, consistent with expected evolution/ subacute time course. No areas of acute infarction in the LEFT hemisphere are observed. No mass lesion, hydrocephalus, extra-axial fluid, or hemorrhage. Normal for age cerebral volume. Mild subcortical and periventricular T2 and FLAIR hyperintensities, likely chronic microvascular ischemic change. No  midline abnormality. Flow voids are maintained. Extracranial soft tissues unremarkable. MRA HEAD FINDINGS Widely patent internal carotid arteries, basilar artery, and both vertebral arteries. Dolichoectatic intracranial vasculature without stenosis or occlusion. No intracranial aneurysm. No congenital anomaly or distal MCA branch lesion. IMPRESSION: No acute LEFT hemisphere event is identified. Subacute RIGHT hemisphere nonhemorrhagic infarction. Stable mild chronic microvascular ischemic change. No intracranial stenosis or large vessel occlusion. Electronically Signed   By: Elsie Stain M.D.   On: 10/29/2015 11:57  Mr Brain Wo Contrast  Result Date: 09/29/2015 CLINICAL DATA:  Worsening deficits from previously documented stroke. EXAM: MRI HEAD WITHOUT CONTRAST TECHNIQUE: Multiplanar, multiecho pulse sequences of the brain and surrounding structures were obtained without intravenous contrast. COMPARISON:  MRI brain 09/25/2015.  CT head earlier today. FINDINGS: The distribution of acute infarction noted previously in similar, slightly larger, with increased intensity of signal on diffusion-weighted imaging. Suspect slight extension of the previously identified acute infarct. Within limits for detection on MR, no hemorrhagic transformation. Evolving cytotoxic edema. No new areas of infarction. No visible large vessel or major MCA branch occlusion. Normal for age cerebral volume. Mild subcortical and periventricular T2 and FLAIR hyperintensities, likely chronic microvascular ischemic change. Pituitary, pineal, and cerebellar tonsils unremarkable. No upper cervical lesions. Flow voids are preserved. Extracranial soft tissues grossly unremarkable. IMPRESSION: Small acute RIGHT posterior frontal cortical and subcortical infarction, slightly increased in size from 09/25/2015, also with increased intensity of  signal on diffusion-weighted imaging, consistent with the clinical symptoms of worsening deficit. No  hemorrhagic transformation. Otherwise stable scan. Electronically Signed   By: John T CElsie Stain   On: 09/29/2015 20:36   Mr Maxine Glenn Head/brain WU Cm  Result Date: 10/29/2015 CLINICAL DATA:  Acute onset of RIGHT-sided weakness. History of stroke in June affecting the RIGHT hemisphere. EXAM: MRI HEAD WITHOUT CONTRAST MRA HEAD WITHOUT CONTRAST TECHNIQUE: Multiplanar, multiecho pulse sequences of the brain and surrounding structures were obtained without intravenous contrast. Angiographic images of the head were obtained using MRA technique without contrast. COMPARISON:  MRI brain 09/29/2015. MRI brain 09/25/2015. CT head 10/28/2015. FINDINGS: MRI HEAD FINDINGS Restricted diffusion again noted in the RIGHT posterior frontal cortex and subcortical white matter, less intense compared with most recent prior, consistent with expected evolution/ subacute time course. No areas of acute infarction in the LEFT hemisphere are observed. No mass lesion, hydrocephalus, extra-axial fluid, or hemorrhage. Normal for age cerebral volume. Mild subcortical and periventricular T2 and FLAIR hyperintensities, likely chronic microvascular ischemic change. No midline abnormality. Flow voids are maintained. Extracranial soft tissues unremarkable. MRA HEAD FINDINGS Widely patent internal carotid arteries, basilar artery, and both vertebral arteries. Dolichoectatic intracranial vasculature without stenosis or occlusion. No intracranial aneurysm. No congenital anomaly or distal MCA branch lesion. IMPRESSION: No acute LEFT hemisphere event is identified. Subacute RIGHT hemisphere nonhemorrhagic infarction. Stable mild chronic microvascular ischemic change. No intracranial stenosis or large vessel occlusion. Electronically Signed   By: Elsie Stain M.D.   On: 10/29/2015 11:57  Ct Head Code Stroke W/o Cm  Result Date: 10/28/2015 CLINICAL DATA:  55 year old female with right-sided weakness and ectasia. Code stroke. EXAM: CT HEAD WITHOUT  CONTRAST TECHNIQUE: Contiguous axial images were obtained from the base of the skull through the vertex without intravenous contrast. COMPARISON:  Head CT dated 09/29/2015 an MRI dated 09/29/2015 FINDINGS: The ventricles and sulci appropriate size for patient's age. Minimal periventricular and deep white matter chronic microvascular ischemic changes may be present. There is no acute intracranial hemorrhage. No mass effect or midline shift. There is mild mucoperiosteal thickening of paranasal sinuses. No air-fluid levels. The mastoid air cells are clear. The calvarium is intact. IMPRESSION: No acute intracranial pathology If symptoms persist and there are no contraindications, MRI may provide better evaluation if clinically indicated These results were called by telephone at the time of interpretation on 10/28/2015 at 9:54 pm to Dr.Stewart, who verbally acknowledged these results. Electronically Signed   By: Elgie Collard M.D.   On: 10/28/2015 21:55  US Abdomen Limited Ruq  Result Date: 09/30/2015 CLINICAL DATA:  Elevated liver function studies EXAM: US ABDOMEN LIMITED - RIGHT UPPER QUADRANT COMPARISON:  Abdominal ultrasound of January 11 2006 FINDINGS: Gallbladder: The gallbladder is adequately distended. A previously described gallbladder polyp all along the anterior mucosal surface is no longer evident. No stones or sludge are observed. There is no wall thickening or sonographic Murphy's sign. Common bile duct: Diameter: 4.2 mm Liver: The hepatic echotexture is mildly increased diffusely. There is no focal mass nor ductal dilation. IMPRESSION: No evidence of gallstones nor other acute gallbladder abnormality. If there are clinical concerns of gallbladder dysfunction, a nuclear medicine hepatobiliary scan may be useful. Increased hepatic echotexture is consistent with fatty infiltrative change. Electronically Signed   By: David  [place] M.D.   On: 09/30/2015 11:26    Time Spent in minutes  25   Eddie North M.D on 10/29/2015 at 12:23 PM  Between 7am to 7pm - Pager -  [phone]  After 7pm go to [email or web address] - password [facility]  [facility] -  Office  [phone]

## 2015-10-29 NOTE — Progress Notes (Signed)
PT Cancellation Note  Patient Details Name: Jenna Delgado MRN: [id number] DOB: 04-Apr-1960   Cancelled Treatment:    Reason Eval/Treat Not Completed: Patient at procedure or test/unavailable (patient off the floor at MRI at this time)   Fabio Asa 10/29/2015, 10:36 AM Charlotte Crumb, PT DPT  [phone]

## 2015-10-29 NOTE — Progress Notes (Signed)
Patient receive via stretcher with E.R. Staff alert and orin. R.N. Into do assess. Patient orin. To room.

## 2015-10-29 NOTE — Progress Notes (Signed)
OT Cancellation Note  Patient Details Name: LYNLEA HAUER MRN: [id number] DOB: 12-07-1960   Cancelled Treatment:    Reason Eval/Treat Not Completed: Patient at procedure or test/ unavailable (MRI).  Gaye Alken M.S., OTR/L Pager: [phone]  10/29/2015, 10:37 AM

## 2015-10-29 NOTE — Progress Notes (Signed)
STROKE TEAM PROGRESS NOTE   HISTORY OF PRESENT ILLNESS (per record) Jenna Delgado is an 55 y.o. female with a history of diabetes mellitus, hyperlipidemia and recurrent stroke 4 weeks ago, presenting with new onset right-sided weakness involving upper extremity primarily, along with mild expressive aphasia. She's been taking aspirin 325 mg per day. Previous workup, including echocardiogram and carotid Doppler study as well as hypercoagulopathy panel, his been unremarkable. CT scan of her head today showed no acute intracranial abnormality. She had mild drift of right upper and lower extremities as well as slight coordination difficulty of upper extremities all speech abnormality. NIH stroke score was 6. Patient was not deemed a candidate for TPA because of recent see of stroke.  LSN: 8:50 PM on 10/28/2015 tPA Given: No: As noted above mRankin:   SUBJECTIVE (INTERVAL HISTORY) Her husband is at the bedside.  Overall she feels her condition is unchanged. She states she feel exhausted and tingles on the right side.  In addition the right side still feels heavy.  With the husband we reviewed the event that brought her in.  He describes speech arrest and inability to move the right side.  She is amnestic to some of the details of the event   OBJECTIVE Temp:  [97.8 F (36.6 C)-98.5 F (36.9 C)] 98.1 F (36.7 C) (07/29 0634) Pulse Rate:  [62-86] 69 (07/29 0634) Cardiac Rhythm: Normal sinus rhythm (07/29 0701) Resp:  [14-22] 16 (07/29 0634) BP: (99-126)/(52-76) 103/62 (07/29 0634) SpO2:  [96 %-99 %] 97 % (07/29 0634) Weight:  [86 kg (189 lb 8 oz)-87.7 kg (193 lb 5.5 oz)] 86 kg (189 lb 8 oz) (07/28 2355)  CBC:  Recent Labs Lab 10/28/15 2215 10/29/15 0216  WBC 10.8* 9.6  NEUTROABS 7.3  --   HGB 13.2 12.5  HCT 40.2 38.2  MCV 93.5 94.1  PLT 221 195    Basic Metabolic Panel:  Recent Labs Lab 10/28/15 2148 10/28/15 2215 10/29/15 0216  NA 141 136  --   K 4.1 4.1  --   CL 106 107   --   CO2  --  21*  --   GLUCOSE 118* 117*  --   BUN 21* 17  --   CREATININE 0.80 0.82 0.75  CALCIUM  --  9.0  --     Lipid Panel:    Component Value Date/Time   CHOL 150 09/30/2015 0820   TRIG 111 09/30/2015 0820   HDL 51 09/30/2015 0820   CHOLHDL 2.9 09/30/2015 0820   VLDL 22 09/30/2015 0820   LDLCALC 77 09/30/2015 0820   HgbA1c:  Lab Results  Component Value Date   HGBA1C 6.0 (H) 09/30/2015   Urine Drug Screen:    Component Value Date/Time   LABOPIA NONE DETECTED 10/29/2015 0648   COCAINSCRNUR NONE DETECTED 10/29/2015 0648   LABBENZ NONE DETECTED 10/29/2015 0648   AMPHETMU NONE DETECTED 10/29/2015 0648   THCU POSITIVE (A) 10/29/2015 0648   LABBARB NONE DETECTED 10/29/2015 0648      IMAGING  Mr Maxine Glenn Head/brain Wo Cm 10/29/2015 No acute LEFT hemisphere event is identified. Subacute RIGHT hemisphere nonhemorrhagic infarction. Stable mild chronic microvascular ischemic change. No intracranial stenosis or large vessel occlusion.    Ct Head Code Stroke W/o Cm 10/28/2015 No acute intracranial pathology If symptoms persist and there are no contraindications, MRI may provide better evaluation if clinically indicated   PHYSICAL EXAM HEENT-  Normocephalic, no lesions, without obvious abnormality.  Normal external eye and conjunctiva.  Neck supple with no masses, nodes, nodules or enlargement. Cardiovascular - regular rate and rhythm, S1, S2 normal, no murmur, click, rub or gallop Lungs - chest clear, no wheezing, rales, normal symmetric air entry Abdomen - soft, non-tender; bowel sounds normal; no masses,  no organomegaly Extremities - no joint deformities, effusion, or inflammation and no edema  Neurologic Examination: Mental Status: Alert, oriented, thought content appropriate.  No word finding difficulty was noted. Able to follow commands without difficulty. Cranial Nerves: II-Visual fields were normal. III/IV/VI-Pupils were equal and reacted normally to light.  Extraocular movements were full and conjugate.    V/VII-no facial numbness and no facial weakness. VIII-normal. X-minimal dysarthria. XI: trapezius strength/neck flexion strength normal bilaterally XII-midline tongue extension with normal strength. Motor: Mild drift of right upper and lower extremities; motor exam otherwise unremarkable. Sensory: Normal throughout. Deep Tendon Reflexes: 2+ and symmetric. Plantars: Flexor bilaterally Cerebellar: Mild coordination difficulty of right extremities. Carotid auscultation: Normal  ASSESSMENT/PLAN [name] is a 55 y.o. female with history of hyperlipidemia, diabetes mellitus, stroke approximately 4 weeks ago, and bipolar disorder presenting with right upper extremity weakness and speech difficulties.  She did not receive IV t-PA due to recent stroke.  Possible TIA :  Dominant   Resultant  Right sided weakness and speech arrest now mostly resolved.  Unclear if episode was seizure in origin  MRI  No acute LEFT hemisphere event is identified. Subacute RIGHT hemisphere nonhemorrhagic infarction.  MRA  no large vessel stenosis or occlusion  Carotid Doppler - 09/26/2015 - unremarkable  2D Echo - 10/03/2015 - EF 60-65%. Cardiac source of emboli identified.  LDL - 09/30/2015 - 77  HgbA1c - 6.0  VTE prophylaxis - Lovenox  Diet Heart Room service appropriate? Yes; Fluid consistency: Thin  aspirin 325 mg daily prior to admission, now on aspirin 325 mg daily  Patient counseled to be compliant with her antithrombotic medications  Ongoing aggressive stroke risk factor management  Therapy recommendations: Pending  Disposition:  Pending  Hypertension  Blood pressure tends to run low. Currently not on antihypertensive medications.  Permissive hypertension (OK if < 220/120) but gradually normalize in 5-7 days  Long-term BP goal normotensive  Hyperlipidemia  Home meds:  Lipitor 80 mg daily resumed in hospital  LDL 77, goal <  70  Continue statin at discharge  Diabetes  HgbA1c 6.0, goal < 7.0  Controlled  Other Stroke Risk Factors  ETOH use, advised to drink no more than 1 drink(s) a day  Obesity, Body mass index is 32.53 kg/m., recommend weight loss, diet and exercise as appropriate   Hx stroke/TIA   Other Active Problems  Loop implanted 10/03/2015 - Asked cardiology to interrogate and there were no positive findings  UDS [facility] day # 0  ATTENDING NOTE: Patient was seen and examined by me personally. Documentation reflects findings. The laboratory and radiographic studies reviewed by me. ROS completed by me personally and pertinent positives fully documented  Condition:  Stable  Assessment and plan completed by me personally and fully documented above. Plans/Recommendations include:     Ordered EEG to given event may have been related to seizure.  Will review results when completed  Stroke work up complete given most studies done during last admission 4 weeks ago  Discussed the fact that patient snores and has observed apnea according to her husband.  Should get formal sleep study as an outpatient  SIGNED BY: Dr. Sula Soda      To contact  Stroke Continuity provider, please refer to [email or web address] After hours, contact General Neurology

## 2015-10-29 NOTE — Evaluation (Signed)
Occupational Therapy Evaluation Patient Details Name: Jenna Delgado MRN: [id number] DOB: 09-03-1960 Today's Date: 10/29/2015    History of Present Illness 55 y.o. female with history of hyperlipidemia, diabetes mellitus, stroke approximately 4 weeks ago, and bipolar disorder presenting with right upper extremity weakness and speech difficulties   Clinical Impression   Pt reports she was independent with ADL PTA. Currently pt is overall supervision for safety with ADL and functional mobility. Pt presenting with RUE weakness (proximal>distal) and decreased fine motor coordination. Pt planning to d/c home with supervision from family. Recommending outpatient OT for follow up. Pt would benefit from continued skilled OT to established RUE HEP.    Follow Up Recommendations  Outpatient OT;Supervision - Intermittent    Equipment Recommendations  None recommended by OT    Recommendations for Other Services       Precautions / Restrictions Precautions Precautions: None Restrictions Weight Bearing Restrictions: No      Mobility Bed Mobility Overal bed mobility: Modified Independent             General bed mobility comments: increased time to perform  Transfers Overall transfer level: Needs assistance Equipment used: None Transfers: Sit to/from Stand Sit to Stand: Supervision         General transfer comment: increased time to perform, no physical assist required    Balance Overall balance assessment: No apparent balance deficits (not formally assessed)                                          ADL Overall ADL's : Needs assistance/impaired Eating/Feeding: Independent;Sitting   Grooming: Supervision/safety;Standing   Upper Body Bathing: Supervision/ safety;Sitting   Lower Body Bathing: Supervison/ safety;Sit to/from stand   Upper Body Dressing : Set up;Sitting   Lower Body Dressing: Supervision/safety;Sit to/from stand Lower Body Dressing  Details (indicate cue type and reason): Pt able to don socks sitting EOB Toilet Transfer: Supervision/safety;Ambulation Toilet Transfer Details (indicate cue type and reason): Simulated by sit to stand from EOB Toileting- Clothing Manipulation and Hygiene: Supervision/safety;Sit to/from stand   Tub/ Shower Transfer: Supervision/safety;Tub transfer;Ambulation Tub/Shower Transfer Details (indicate cue type and reason): Pt able to demo sitting in bottom of tub for bath with supervision for safety. Functional mobility during ADLs: Supervision/safety       Vision Vision Assessment?: No apparent visual deficits   Perception     Praxis      Pertinent Vitals/Pain Pain Assessment: No/denies pain     Hand Dominance Right   Extremity/Trunk Assessment Upper Extremity Assessment Upper Extremity Assessment: RUE deficits/detail RUE Deficits / Details: AROM WFL. Shoulder flex/ext 4-/5, elbow flex/ext 4-/5. Good grip strength. Slight decrease in fine motor coordination. RUE Sensation: decreased light touch RUE Coordination: decreased fine motor   Lower Extremity Assessment Lower Extremity Assessment: RLE deficits/detail (gross strength BLEs 4/5) RLE Sensation: decreased light touch   Cervical / Trunk Assessment Cervical / Trunk Assessment: Normal (increased body habitus)   Communication Communication Communication: No difficulties   Cognition Arousal/Alertness: Awake/alert Behavior During Therapy: WFL for tasks assessed/performed Overall Cognitive Status: Within Functional Limits for tasks assessed                     General Comments       Exercises       Shoulder Instructions      Home Living Family/patient expects to be discharged to:: Private residence Living  Arrangements: Spouse/significant other Available Help at Discharge: Family;Available PRN/intermittently Type of Home: House Home Access: Stairs to enter [employer] of Steps: 3 Entrance  Stairs-Rails: None Home Layout: Two level;Bed/bath upstairs Alternate Level Stairs-Number of Steps: flight Alternate Level Stairs-Rails: Right Bathroom Shower/Tub: Walk-in shower;Door   [employer]: Standard     Home Equipment: Shower seat - built in          Prior Functioning/Environment Level of Independence: Independent             OT Diagnosis: Generalized weakness   OT Problem List: Decreased strength;Decreased coordination;Impaired UE functional use   OT Treatment/Interventions: Therapeutic exercise;Therapeutic activities;Patient/family education    OT Goals(Current goals can be found in the care plan section) Acute Rehab OT Goals Patient Stated Goal: to go home OT Goal Formulation: With patient Time For Goal Achievement: 11/12/15 Potential to Achieve Goals: Good ADL Goals Pt/caregiver will Perform Home Exercise Program: Increased strength;Right Upper extremity;With theraband;With theraputty;With written HEP provided;Independently (increase fine motor coordination)  OT Frequency: Min 2X/week   Barriers to D/C:            Co-evaluation              End of Session Equipment Utilized During Treatment: Gait belt  Activity Tolerance: Patient tolerated treatment well Patient left: in bed;with call bell/phone within reach   Time: 1425-1440 OT Time Calculation (min): 15 min Charges:  OT General Charges $OT Visit: 1 Procedure OT Evaluation $OT Eval Moderate Complexity: 1 Procedure G-Codes: OT G-codes **NOT FOR INPATIENT CLASS** Functional Assessment Tool Used: Clinical judgement Functional Limitation: Self care Self Care Current Status (Y2334): At least 1 percent but less than 20 percent impaired, limited or restricted Self Care Goal Status (D5686): At least 1 percent but less than 20 percent impaired, limited or restricted   Gaye Alken M.S., OTR/L Pager: [phone]  10/29/2015, 3:09 PM

## 2015-10-29 NOTE — Evaluation (Signed)
Physical Therapy Evaluation Patient Details Name: Jenna Delgado MRN: [id number] DOB: 09-28-1960 Today's Date: 10/29/2015   History of Present Illness  55 y.o. female with history of hyperlipidemia, diabetes mellitus, stroke approximately 4 weeks ago, and bipolar disorder presenting with right upper extremity weakness and speech difficulties  Clinical Impression  Patient seen for mobility evaluation and assessment. At this time, patient mobilizing well, steady with ambulation and stair negotiation. Patient aware of safety with mobility secondary to history of CVA. At this time, do not feel patient will required further acute PT needs, will sign off.    Follow Up Recommendations No PT follow up    Equipment Recommendations  None recommended by PT    Recommendations for Other Services       Precautions / Restrictions Precautions Precautions: None Restrictions Weight Bearing Restrictions: No      Mobility  Bed Mobility Overal bed mobility: Modified Independent             General bed mobility comments: increased time to perform  Transfers Overall transfer level: Needs assistance Equipment used: None Transfers: Sit to/from Stand Sit to Stand: Supervision         General transfer comment: increased time to perform, no physical assist required  Ambulation/Gait Ambulation/Gait assistance: Modified independent (Device/Increase time) Ambulation Distance (Feet): 340 Feet Assistive device: None Gait Pattern/deviations: Step-through pattern;Decreased stride length Gait velocity: decreased Gait velocity interpretation: Below normal speed for age/gender General Gait Details: modest instabilty but no physical assist required  Stairs Stairs: Yes Stairs assistance: Supervision Stair Management: One rail Left Number of Stairs: 12 General stair comments: no assist needed, use of rail on left  Wheelchair Mobility    Modified Rankin (Stroke Patients Only) Modified  Rankin (Stroke Patients Only) Pre-Morbid Rankin Score: Moderate disability Modified Rankin: Moderate disability     Balance                                     Dynamic Gait Index Level Surface: Normal Change in Gait Speed: Mild Impairment Gait with Horizontal Head Turns: Mild Impairment Gait with Vertical Head Turns: Mild Impairment Gait and Pivot Turn: Mild Impairment Step Over Obstacle: Mild Impairment Step Around Obstacles: Mild Impairment Steps: Mild Impairment Total Score: 17       Pertinent Vitals/Pain Pain Assessment: No/denies pain    Home Living Family/patient expects to be discharged to:: Private residence Living Arrangements: Spouse/significant other Available Help at Discharge: Family;Available PRN/intermittently Type of Home: House Home Access: Stairs to enter Entrance Stairs-Rails: None Entrance Stairs-Number of Steps: 3 Home Layout: Two level;Bed/bath upstairs Home Equipment: Shower seat - built in      Prior Function Level of Independence: Independent               Hand Dominance   Dominant Hand: Right    Extremity/Trunk Assessment   Upper Extremity Assessment: RUE deficits/detail RUE Deficits / Details: AROM WFL. Shoulder flex/ext 4-/5, elbow flex/ext 4-/5. Good grip strength. Slight decrease in fine motor coordination.   RUE Sensation: decreased light touch     Lower Extremity Assessment: RLE deficits/detail (gross strength BLEs 4/5)      Cervical / Trunk Assessment: Normal (increased body habitus)  Communication   Communication: No difficulties  Cognition Arousal/Alertness: Awake/alert Behavior During Therapy: WFL for tasks assessed/performed Overall Cognitive Status: Within Functional Limits for tasks assessed  General Comments      Exercises        Assessment/Plan    PT Assessment Patent does not need any further PT services  PT Diagnosis     PT Problem List    PT  Treatment Interventions     PT Goals (Current goals can be found in the Care Plan section) Acute Rehab PT Goals Patient Stated Goal: to go home PT Goal Formulation: All assessment and education complete, DC therapy    Frequency     Barriers to discharge        Co-evaluation               End of Session Equipment Utilized During Treatment: Gait belt Activity Tolerance: Patient tolerated treatment well Patient left: in bed;with call bell/phone within reach (sitting EOB) Nurse Communication: Mobility status    Functional Assessment Tool Used: clincial judgement Functional Limitation: Mobility: Walking and moving around Mobility: Walking and Moving Around Current Status (Z6109): At least 1 percent but less than 20 percent impaired, limited or restricted Mobility: Walking and Moving Around Goal Status [phone]): At least 1 percent but less than 20 percent impaired, limited or restricted Mobility: Walking and Moving Around Discharge Status [phone]): At least 1 percent but less than 20 percent impaired, limited or restricted    Time: 1440-1452 PT Time Calculation (min) (ACUTE ONLY): 12 min   Charges:   PT Evaluation $PT Eval Low Complexity: 1 Procedure     PT G Codes:   PT G-Codes **NOT FOR INPATIENT CLASS** Functional Assessment Tool Used: clincial judgement Functional Limitation: Mobility: Walking and moving around Mobility: Walking and Moving Around Current Status (B1478): At least 1 percent but less than 20 percent impaired, limited or restricted Mobility: Walking and Moving Around Goal Status [phone]): At least 1 percent but less than 20 percent impaired, limited or restricted Mobility: Walking and Moving Around Discharge Status [phone]): At least 1 percent but less than 20 percent impaired, limited or restricted    Fabio Asa 10/29/2015, 3:00 PM  Charlotte Crumb, PT DPT  [phone]

## 2015-10-30 DIAGNOSIS — E785 Hyperlipidemia, unspecified: Secondary | ICD-10-CM | POA: Diagnosis not present

## 2015-10-30 DIAGNOSIS — G458 Other transient cerebral ischemic attacks and related syndromes: Secondary | ICD-10-CM | POA: Diagnosis not present

## 2015-10-30 MED ORDER — LORAZEPAM 1 MG PO TABS
1.0000 mg | ORAL_TABLET | Freq: Once | ORAL | Status: AC
  Administered 2015-10-30: 1 mg via ORAL
  Filled 2015-10-30: qty 1

## 2015-10-30 NOTE — Progress Notes (Signed)
Occupational Therapy Treatment Patient Details Name: Jenna Delgado MRN: [id number] DOB: 1960-04-29 Today's Date: 10/30/2015    History of present illness 55 y.o. female with history of hyperlipidemia, diabetes mellitus, stroke approximately 4 weeks ago, and bipolar disorder presenting with right upper extremity weakness and speech difficulties   OT comments  Pt. Provided with handouts,  theraputty and theraband to continue her work with B UE strengthening.  Focus on R UE this session.  Pt. Able to return demo for all Reports recent LUE involvement (4 weeks ago) and states she completed similar exercises and tasks then.  States no further questions or concerns.  Will alert OTR/L to sign off.    Follow Up Recommendations  Outpatient OT;Supervision - Intermittent    Equipment Recommendations  None recommended by OT    Recommendations for Other Services      Precautions / Restrictions Precautions Precautions: None       Mobility Bed Mobility                  Transfers                      Balance                                   ADL                                                Vision                     Perception     Praxis      Cognition   Behavior During Therapy: WFL for tasks assessed/performed Overall Cognitive Status: Within Functional Limits for tasks assessed                       Extremity/Trunk Assessment               Exercises Other Exercises Other Exercises: gave theraputty and theraband for L hand to promote dexterity and strength-pts. spouse requested 2 different strengths of theraband to "prepare her for when she gets stronger".  2 levels of theraband provided.   Other Exercises: Issued pt 2 handouts (one with therapy putty exercises and one with FM activties on it)-reports previous admission for other side and was well versed on all recommendations. and how to complete  exercises on each sheet.     Shoulder Instructions       General Comments      Pertinent Vitals/ Pain       Pain Assessment: No/denies pain  Home Living                                          Prior Functioning/Environment              Frequency Min 2X/week     Progress Toward Goals  OT Goals(current goals can now be found in the care plan section)  Progress towards OT goals: Goals met/education completed, patient discharged from [facility] Discharge plan remains appropriate    Co-evaluation  End of Session     Activity Tolerance Patient tolerated treatment well   Patient Left in bed;with call bell/phone within reach;Other (comment) (md present at end of session)   Nurse Communication          Time: [phone] OT Time Calculation (min): 20 min  Charges: OT General Charges $OT Visit: 1 Procedure OT Treatments $Therapeutic Exercise: 8-22 mins  Janice Coffin, COTA/L 10/30/2015, 12:00 PM

## 2015-10-30 NOTE — Progress Notes (Signed)
STROKE TEAM PROGRESS NOTE   HISTORY OF PRESENT ILLNESS (per record) Jenna Delgado is an 55 y.o. female with a history of diabetes mellitus, hyperlipidemia and recurrent stroke 4 weeks ago, presenting with new onset right-sided weakness involving upper extremity primarily, along with mild expressive aphasia. She's been taking aspirin 325 mg per day. Previous workup, including echocardiogram and carotid Doppler study as well as hypercoagulopathy panel, his been unremarkable. CT scan of her head today showed no acute intracranial abnormality. She had mild drift of right upper and lower extremities as well as slight coordination difficulty of upper extremities all speech abnormality. NIH stroke score was 6. Patient was not deemed a candidate for TPA because of recent see of stroke.  LSN: 8:50 PM on 10/28/2015 tPA Given: No: As noted above mRankin:   SUBJECTIVE (INTERVAL HISTORY) Her friends are at the bedside.  Overall she feels her condition is improveded. She states she feels less exhausted and less tingles on the right side.  In addition the right side feels less heavy.    OBJECTIVE Temp:  [98.6 F (37 C)-99.3 F (37.4 C)] 98.9 F (37.2 C) (07/30 1422) Pulse Rate:  [63-75] 75 (07/30 1422) Cardiac Rhythm: Sinus bradycardia (07/30 0700) Resp:  [18-20] 20 (07/30 1422) BP: (98-131)/(60-66) 106/64 (07/30 1422) SpO2:  [96 %-99 %] 96 % (07/30 1422)  CBC:   Recent Labs Lab 10/28/15 2215 10/29/15 0216  WBC 10.8* 9.6  NEUTROABS 7.3  --   HGB 13.2 12.5  HCT 40.2 38.2  MCV 93.5 94.1  PLT 221 195    Basic Metabolic Panel:   Recent Labs Lab 10/28/15 2148 10/28/15 2215 10/29/15 0216  NA 141 136  --   K 4.1 4.1  --   CL 106 107  --   CO2  --  21*  --   GLUCOSE 118* 117*  --   BUN 21* 17  --   CREATININE 0.80 0.82 0.75  CALCIUM  --  9.0  --     Lipid Panel:     Component Value Date/Time   CHOL 150 09/30/2015 0820   TRIG 111 09/30/2015 0820   HDL 51 09/30/2015 0820    CHOLHDL 2.9 09/30/2015 0820   VLDL 22 09/30/2015 0820   LDLCALC 77 09/30/2015 0820   HgbA1c:  Lab Results  Component Value Date   HGBA1C 6.0 (H) 09/30/2015   Urine Drug Screen:     Component Value Date/Time   LABOPIA NONE DETECTED 10/29/2015 0648   COCAINSCRNUR NONE DETECTED 10/29/2015 0648   LABBENZ NONE DETECTED 10/29/2015 0648   AMPHETMU NONE DETECTED 10/29/2015 0648   THCU POSITIVE (A) 10/29/2015 0648   LABBARB NONE DETECTED 10/29/2015 0648      IMAGING  Mr Maxine Glenn Head/brain Wo Cm 10/29/2015 No acute LEFT hemisphere event is identified. Subacute RIGHT hemisphere nonhemorrhagic infarction. Stable mild chronic microvascular ischemic change. No intracranial stenosis or large vessel occlusion.    Ct Head Code Stroke W/o Cm 10/28/2015 No acute intracranial pathology If symptoms persist and there are no contraindications, MRI may provide better evaluation if clinically indicated   PHYSICAL EXAM HEENT-  Normocephalic, no lesions, without obvious abnormality.  Normal external eye and conjunctiva.   Neck supple with no masses, nodes, nodules or enlargement. Cardiovascular - regular rate and rhythm, S1, S2 normal, no murmur, click, rub or gallop Lungs - chest clear, no wheezing, rales, normal symmetric air entry Abdomen - soft, non-tender; bowel sounds normal; no masses,  no organomegaly Extremities - no joint  deformities, effusion, or inflammation and no edema  Neurologic Examination: Mental Status: Alert, oriented, thought content appropriate.  No word finding difficulty was noted. Able to follow commands without difficulty. Cranial Nerves: II-Visual fields were normal. III/IV/VI-Pupils were equal and reacted normally to light. Extraocular movements were full and conjugate.    V/VII-no facial numbness and no facial weakness. VIII-normal. X-minimal dysarthria. XI: trapezius strength/neck flexion strength normal bilaterally XII-midline tongue extension with normal  strength. Motor: Mild drift of right upper and lower extremities; motor exam otherwise unremarkable. Sensory: Normal throughout. Deep Tendon Reflexes: 2+ and symmetric. Plantars: Flexor bilaterally Cerebellar: Mild coordination difficulty of right extremities. Carotid auscultation: Normal  ASSESSMENT/PLAN Jenna Delgado is a 55 y.o. female with history of hyperlipidemia, diabetes mellitus, stroke approximately 4 weeks ago, and bipolar disorder presenting with right upper extremity weakness and speech difficulties.  She did not receive IV t-PA due to recent stroke.  Possible TIA :  Dominant   Resultant  Right sided weakness and speech arrest now mostly resolved.  Unclear if episode was seizure in origin  MRI  No acute LEFT hemisphere event is identified. Subacute RIGHT hemisphere nonhemorrhagic infarction.  MRA  no large vessel stenosis or occlusion  Carotid Doppler - 09/26/2015 - unremarkable  2D Echo - 10/03/2015 - EF 60-65%. Cardiac source of emboli identified.  LDL - 09/30/2015 - 77  HgbA1c - 6.0  VTE prophylaxis - Lovenox Diet Heart Room service appropriate? Yes; Fluid consistency: Thin  aspirin 325 mg daily prior to admission, now on aspirin 325 mg daily  Patient counseled to be compliant with her antithrombotic medications  Ongoing aggressive stroke risk factor management  Therapy recommendations: Outpatient occupational therapy  Disposition:  Pending  Hypertension  Blood pressure tends to run low. Currently not on antihypertensive medications.  Permissive hypertension (OK if < 220/120) but gradually normalize in 5-7 days  Long-term BP goal normotensive  Hyperlipidemia  Home meds:  Lipitor 80 mg daily resumed in hospital  LDL 77, goal < 70  Continue statin at discharge  Diabetes  HgbA1c 6.0, goal < 7.0  Controlled  Other Stroke Risk Factors  ETOH use, advised to drink no more than 1 drink(s) a day  Obesity, Body mass index is 32.53 kg/m.,  recommend weight loss, diet and exercise as appropriate   Hx stroke/TIA   Other Active Problems  Loop implanted 10/03/2015 - Asked cardiology to interrogate and there were no positive findings  UDS - [facility]  EEG pending - no driving until EEG is cleared by MD  Hospital day # 0  ATTENDING NOTE: Patient was seen and examined by me personally. Documentation reflects findings. The laboratory and radiographic studies reviewed by me. ROS completed by me personally and pertinent positives fully documented  Condition:  Stable  Assessment and plan completed by me personally and fully documented above. Plans/Recommendations include:     EEG given event may have been related to seizure.  Will review results when completed.  Will determine driving instructions at that time.  Advised patient to remain awake to produce sleep deprived study  Stroke work up complete given most studies done during last admission 4 weeks ago  Discussed the fact that patient snores and has observed apnea according to her husband.  Should get formal sleep study as an outpatient  Patient requested speech therapy eval because "her words get foggy"  SIGNED BY: Dr. Sula Soda      To contact Stroke Continuity provider, please refer to [email or web address]. After hours, contact  General Neurology

## 2015-10-30 NOTE — Progress Notes (Signed)
PROGRESS NOTE                                                                                                                                                                                                             Patient Demographics:    Jenna Delgado, is a 55 y.o. female, DOB - Mar 28, 1961, WUJ:[id number]  Admit date - 10/28/2015   Admitting Physician Bobette Mo, MD  Outpatient Primary MD for the patient is Gaye Alken, MD  LOS - 0  Outpatient Specialists:None  Chief Complaint  Patient presents with  . Code Stroke       Brief Narrative   55 year old female with recent embolic cerebellar stroke with Negative TEE and implantable loop recorder placed about 2 weeks back, asthma, bipolar disorder presented with acute onset of right-sided weakness and numbness associated with difficulty articulating words.. Admitted for TIA/ischemic stroke workup.   Subjective:   Patient denies any  Assessment  & Plan :    Principal Problem:   TIA (transient ischemic attack) Risk factors include diabetes mellitus, hyperlipidemia and recent cerebellar stroke. MRI brain shows subacute right-sided infarct (relating to recent Stroke). Seen by stroke team who think this is either TIA or possible seizure. EEG ordered.  Recent 2-D echo and carotid done. Right-sided weakness has resolved.. Continue full dose aspirin. Outpatient OT recommended. Recent lipid panel normal. A1c of 6. Urine drug screen is positive for marijuana.   Active Problems:  Type 2 diabetes mellitus Well controlled. A1c of 6. Monitor on sliding scale coverage.  Asthma Stable.  Bipolar disorder Continue Celexa       Code Status : Full code  Family Communication  : Husband at bedside  Disposition Plan  : Pending EEG. Possibly discharge home tomorrow  Barriers For Discharge :EEG  Consults  :  Neurology  Procedures  :  CT  head MRI / MRA brain  DVT Prophylaxis  :  Lovenox   Lab Results  Component Value Date   PLT 195 10/29/2015    Antibiotics  :   Anti-infectives    None        Objective:   Vitals:   10/29/15 1400 10/29/15 1800 10/30/15 0219 10/30/15 1039  BP: 106/65 131/60 126/66 98/60  Pulse: 64 69 74 63  Resp: Temp: 98.6 F (37 C) 99.3  F (37.4 C) 98.9 F (37.2 C) 98.6 F (37 C)  TempSrc: Oral Oral Oral Oral  SpO2: 98% 97% 98% 99%  Weight:        Wt Readings from Last 3 Encounters:  10/28/15 86 kg (189 lb 8 oz)  09/29/15 79.4 kg (175 lb)  09/25/15 79.4 kg (175 lb)    No intake or output data in the 24 hours ending 10/30/15 1250   Physical Exam  Gen: not in distress HEENT: moist mucosa, supple neck Chest: clear b/l, no added sounds CVS: N S1&S2, no murmurs,  GI: soft, NT, ND, BS+ Musculoskeletal: warm, no edema CNS:  Alert and oriented. Nonfocal    Data Review:    CBC  Recent Labs Lab 10/28/15 2148 10/28/15 2215 10/29/15 0216  WBC  --  10.8* 9.6  HGB 13.6 13.2 12.5  HCT 40.0 40.2 38.2  PLT  --  221 195  MCV  --  93.5 94.1  MCH  --  30.7 30.8  MCHC  --  32.8 32.7  RDW  --  13.9 13.8  LYMPHSABS  --  2.5  --   MONOABS  --  0.7  --   EOSABS  --  0.3  --   BASOSABS  --  0.0  --     Chemistries   Recent Labs Lab 10/28/15 2148 10/28/15 2215 10/29/15 0216  NA 141 136  --   K 4.1 4.1  --   CL 106 107  --   CO2  --  21*  --   GLUCOSE 118* 117*  --   BUN 21* 17  --   CREATININE 0.80 0.82 0.75  CALCIUM  --  9.0  --   AST  --  22  --   ALT  --  26  --   ALKPHOS  --  79  --   BILITOT  --  1.0  --    ------------------------------------------------------------------------------------------------------------------ No results for input(s): CHOL, HDL, LDLCALC, TRIG, CHOLHDL, LDLDIRECT in the last 72 hours.  Lab Results  Component Value Date   HGBA1C 6.0 (H) 09/30/2015    ------------------------------------------------------------------------------------------------------------------ No results for input(s): TSH, T4TOTAL, T3FREE, THYROIDAB in the last 72 hours.  Invalid input(s): FREET3 ------------------------------------------------------------------------------------------------------------------ No results for input(s): VITAMINB12, FOLATE, FERRITIN, TIBC, IRON, RETICCTPCT in the last 72 hours.  Coagulation profile  Recent Labs Lab 10/28/15 2215  INR 1.04    No results for input(s): DDIMER in the last 72 hours.  Cardiac Enzymes  Recent Labs Lab 10/28/15 2215  TROPONINI <0.03   ------------------------------------------------------------------------------------------------------------------ No results found for: BNP  Inpatient Medications  Scheduled Meds: . aspirin  325 mg Oral Daily  . atorvastatin  80 mg Oral q1800  . citalopram  20 mg Oral Daily  . enoxaparin (LOVENOX) injection  40 mg Subcutaneous Q24H  . pantoprazole  40 mg Oral Daily  . sodium chloride flush  3 mL Intravenous Q12H   Continuous Infusions: . sodium chloride 100 mL/hr (10/28/15 2359)   PRN Meds:.acetaminophen, clonazePAM  Micro Results No results found for this or any previous visit (from the past 240 hour(s)).  Radiology Reports Mr Brain Wo Contrast  Result Date: 10/29/2015 CLINICAL DATA:  Acute onset of RIGHT-sided weakness. History of stroke in June affecting the RIGHT hemisphere. EXAM: MRI HEAD WITHOUT CONTRAST MRA HEAD WITHOUT CONTRAST TECHNIQUE: Multiplanar, multiecho pulse sequences of the brain and surrounding structures were obtained without intravenous contrast. Angiographic images of the head were obtained using MRA technique without contrast. COMPARISON:  MRI brain 09/29/2015. MRI brain 09/25/2015. CT head 10/28/2015. FINDINGS: MRI HEAD FINDINGS Restricted diffusion again noted in the RIGHT posterior frontal cortex and subcortical white matter,  less intense compared with most recent prior, consistent with expected evolution/ subacute time course. No areas of acute infarction in the LEFT hemisphere are observed. No mass lesion, hydrocephalus, extra-axial fluid, or hemorrhage. Normal for age cerebral volume. Mild subcortical and periventricular T2 and FLAIR hyperintensities, likely chronic microvascular ischemic change. No midline abnormality. Flow voids are maintained. Extracranial soft tissues unremarkable. MRA HEAD FINDINGS Widely patent internal carotid arteries, basilar artery, and both vertebral arteries. Dolichoectatic intracranial vasculature without stenosis or occlusion. No intracranial aneurysm. No congenital anomaly or distal MCA branch lesion. IMPRESSION: No acute LEFT hemisphere event is identified. Subacute RIGHT hemisphere nonhemorrhagic infarction. Stable mild chronic microvascular ischemic change. No intracranial stenosis or large vessel occlusion. Electronically Signed   By: Elsie Stain M.D.   On: 10/29/2015 11:57  Mr Maxine Glenn Head/brain WU Cm  Result Date: 10/29/2015 CLINICAL DATA:  Acute onset of RIGHT-sided weakness. History of stroke in June affecting the RIGHT hemisphere. EXAM: MRI HEAD WITHOUT CONTRAST MRA HEAD WITHOUT CONTRAST TECHNIQUE: Multiplanar, multiecho pulse sequences of the brain and surrounding structures were obtained without intravenous contrast. Angiographic images of the head were obtained using MRA technique without contrast. COMPARISON:  MRI brain 09/29/2015. MRI brain 09/25/2015. CT head 10/28/2015. FINDINGS: MRI HEAD FINDINGS Restricted diffusion again noted in the RIGHT posterior frontal cortex and subcortical white matter, less intense compared with most recent prior, consistent with expected evolution/ subacute time course. No areas of acute infarction in the LEFT hemisphere are observed. No mass lesion, hydrocephalus, extra-axial fluid, or hemorrhage. Normal for age cerebral volume. Mild subcortical and  periventricular T2 and FLAIR hyperintensities, likely chronic microvascular ischemic change. No midline abnormality. Flow voids are maintained. Extracranial soft tissues unremarkable. MRA HEAD FINDINGS Widely patent internal carotid arteries, basilar artery, and both vertebral arteries. Dolichoectatic intracranial vasculature without stenosis or occlusion. No intracranial aneurysm. No congenital anomaly or distal MCA branch lesion. IMPRESSION: No acute LEFT hemisphere event is identified. Subacute RIGHT hemisphere nonhemorrhagic infarction. Stable mild chronic microvascular ischemic change. No intracranial stenosis or large vessel occlusion. Electronically Signed   By: Elsie Stain M.D.   On: 10/29/2015 11:57  Ct Head Code Stroke W/o Cm  Result Date: 10/28/2015 CLINICAL DATA:  55 year old female with right-sided weakness and ectasia. Code stroke. EXAM: CT HEAD WITHOUT CONTRAST TECHNIQUE: Contiguous axial images were obtained from the base of the skull through the vertex without intravenous contrast. COMPARISON:  Head CT dated 09/29/2015 an MRI dated 09/29/2015 FINDINGS: The ventricles and sulci appropriate size for patient's age. Minimal periventricular and deep white matter chronic microvascular ischemic changes may be present. There is no acute intracranial hemorrhage. No mass effect or midline shift. There is mild mucoperiosteal thickening of paranasal sinuses. No air-fluid levels. The mastoid air cells are clear. The calvarium is intact. IMPRESSION: No acute intracranial pathology If symptoms persist and there are no contraindications, MRI may provide better evaluation if clinically indicated These results were called by telephone at the time of interpretation on 10/28/2015 at 9:54 pm to Dr.Stewart, who verbally acknowledged these results. Electronically Signed   By: Elgie Collard M.D.   On: 10/28/2015 21:55   Time Spent in minutes  25   Eddie North M.D on 10/30/2015 at 12:50 PM  Between 7am  to 7pm - Pager - [phone]  After 7pm go to [email or web address] - password [email or web address]  [facility] -  Office  [phone]

## 2015-10-31 ENCOUNTER — Observation Stay (HOSPITAL_BASED_OUTPATIENT_CLINIC_OR_DEPARTMENT_OTHER)
Admit: 2015-10-31 | Discharge: 2015-10-31 | Disposition: A | Discharge status: Home / Self Care | Payer: BLUE CROSS/BLUE SHIELD | Attending: Internal Medicine | Admitting: Internal Medicine

## 2015-10-31 DIAGNOSIS — G458 Other transient cerebral ischemic attacks and related syndromes: Secondary | ICD-10-CM | POA: Diagnosis not present

## 2015-10-31 DIAGNOSIS — E785 Hyperlipidemia, unspecified: Secondary | ICD-10-CM | POA: Diagnosis not present

## 2015-10-31 DIAGNOSIS — M6289 Other specified disorders of muscle: Secondary | ICD-10-CM | POA: Diagnosis not present

## 2015-10-31 NOTE — Procedures (Signed)
SLEEP-DEPRIVED ELECTROENCEPHALOGRAM REPORT  Date of Study: 10/31/2015  Patient's Name: Jenna Delgado MRN: [id number] Date of Birth: 1960-04-23  Referring Provider: Delton See, PA-C  Clinical History: 55 y.o. female with a history of diabetes mellitus, hyperlipidemia and recurrent stroke 4 weeks ago, presenting with new onset right-sided weakness involving upper extremity primarily, along with mild expressive aphasia.  Medications: acetaminophen (TYLENOL) tablet 650 mg   aspirin tablet 325 mg   atorvastatin (LIPITOR) tablet 80 mg   citalopram (CELEXA) tablet 20 mg   clonazePAM (KLONOPIN) tablet 0.5 mg   enoxaparin (LOVENOX) injection 40 mg   pantoprazole (PROTONIX) EC tablet 40 mg   sodium chloride flush (NS) 0.9 % injection 3 mL  Technical Summary: A multichannel digital EEG recording measured by the international 10-20 system with electrodes applied with paste and impedances below 5000 ohms performed in our laboratory with EKG monitoring in an awake and asleep patient.  Hyperventilation and photic stimulation were performed.  The digital EEG was referentially recorded, reformatted, and digitally filtered in a variety of bipolar and referential montages for optimal display.    Description: The patient is awake and asleep during the recording.  During maximal wakefulness, there is a symmetric, medium voltage 9-10 Hz posterior dominant rhythm that attenuates with eye opening.  The record is symmetric.  During drowsiness and sleep, there is an increase in theta slowing of the background.  Vertex waves and symmetric sleep spindles were seen.  Hyperventilation and photic stimulation did not elicit any abnormalities.  There were no epileptiform discharges or electrographic seizures seen.    EKG lead was unremarkable.  Impression: This awake and and asleep sleep-deprived EEG is normal.    Clinical Correlation: A normal EEG does not exclude a clinical diagnosis of epilepsy.  If  further clinical questions remain, prolonged EEG may be helpful.  Clinical correlation is advised.   Shon Millet, DO

## 2015-10-31 NOTE — Discharge Summary (Signed)
Physician Discharge Summary  DOVEY FATZINGER WRU:[id number] DOB: 04-May-1960 DOA: 10/28/2015  PCP: Gaye Alken, MD  Admit date: 10/28/2015 Discharge date: 10/31/2015  Admitted From: Home Disposition: Home  Recommendations for Outpatient Follow-up:  1. Follow up with PCP In one week and outpatient stroke clinic (scheduled)  Home Health:No Equipment/Devices: No  Discharge Condition:Fair CODE STATUS: Full code Diet recommendation: Heart Healthy / Carb Modified   Discharge Diagnoses:  Principal Problem:   TIA (transient ischemic attack)  Active Problems:   Stroke (cerebrum) (HCC)   Hyperlipidemia   Abnormal liver function test   Asthma   Bipolar disorder [facility])  Brief narrative 55 year old female with recent embolic cerebellar stroke with Negative TEE and implantable loop recorder placed about 2 weeks back, asthma, bipolar disorder presented with acute onset of right-sided weakness and numbness associated with difficulty articulating words.. Admitted for TIA/ischemic stroke workup.  Principal Problem:   TIA (transient ischemic attack) Risk factors include diabetes mellitus, hyperlipidemia and recent cerebellar stroke. MRI brain shows subacute right-sided infarct (relating to recent Stroke). Seen by stroke team who think this is either TIA or possible seizure. EEG done which was negative for seizure activity. 2-D echo and carotid recently done so was not repeated. Right-sided weakness has resolved.. Continue full dose aspirin. Outpatient OT recommended. Recent lipid panel normal. A1c of 6. Urine drug screen is positive for marijuana.  No further symptoms. Recommend to continue full dose aspirin and statin and continue with lifestyle modifications for secondary stroke prevention..  Active Problems:  Type 2 diabetes mellitus Well controlled. A1c of 6.   Asthma Stable.  Bipolar disorder Continue Celexa        Family Communication  : Husband at  bedside    Consults  :  Neurology  Procedures  :  CT head MRI / MRA brain EEG  Discharge Instructions     Medication List    TAKE these medications   aspirin 325 MG tablet Take 1 tablet (325 mg total) by mouth daily.   atorvastatin 80 MG tablet Commonly known as:  LIPITOR Take 1 tablet (80 mg total) by mouth daily at 6 PM.   citalopram 20 MG tablet Commonly known as:  CELEXA Take 20 mg by mouth daily.   clonazePAM 0.5 MG tablet Commonly known as:  KLONOPIN Take 0.5 mg by mouth daily as needed for anxiety.   esomeprazole 20 MG capsule Commonly known as:  NEXIUM Take 20 mg by mouth daily as needed (for heartburn or acid reflux).      Follow-up Information    Gaye Alken, MD. Schedule an appointment as soon as possible for a visit in 1 week(s).   Specialty:  Family Medicine Contact information: [address] [place] [address] [phone]        SETHI,PRAMOD, MD Follow up in 1 month(s).   Specialties:  Neurology, Radiology Why:  has appt Contact information: [address] [place] [address] [phone]          Allergies  Allergen Reactions  . Benadryl [Diphenhydramine Hcl] Other (See Comments)    Heartpounding, panic attack  . Imitrex [Sumatriptan] Other (See Comments)    Heartpounding, panic attack  . Iodine Other (See Comments)    IVP test problems  . Ivp Dye [Iodinated Diagnostic Agents] Other (See Comments)    IVP test problems  . Lobster [Shellfish Allergy] Itching  . Lamictal [Lamotrigine] Rash  . Tetracyclines & Related Rash    Blisters down the throat  . Theophyllines  Rash    Target rash    Consultations: Neurology  Procedures/Studies: Mr Brain Wo Contrast  Result Date: 10/29/2015 CLINICAL DATA:  Acute onset of RIGHT-sided weakness. History of stroke in June affecting the RIGHT hemisphere. EXAM: MRI HEAD WITHOUT CONTRAST MRA HEAD WITHOUT CONTRAST TECHNIQUE: Multiplanar, multiecho  pulse sequences of the brain and surrounding structures were obtained without intravenous contrast. Angiographic images of the head were obtained using MRA technique without contrast. COMPARISON:  MRI brain 09/29/2015. MRI brain 09/25/2015. CT head 10/28/2015. FINDINGS: MRI HEAD FINDINGS Restricted diffusion again noted in the RIGHT posterior frontal cortex and subcortical white matter, less intense compared with most recent prior, consistent with expected evolution/ subacute time course. No areas of acute infarction in the LEFT hemisphere are observed. No mass lesion, hydrocephalus, extra-axial fluid, or hemorrhage. Normal for age cerebral volume. Mild subcortical and periventricular T2 and FLAIR hyperintensities, likely chronic microvascular ischemic change. No midline abnormality. Flow voids are maintained. Extracranial soft tissues unremarkable. MRA HEAD FINDINGS Widely patent internal carotid arteries, basilar artery, and both vertebral arteries. Dolichoectatic intracranial vasculature without stenosis or occlusion. No intracranial aneurysm. No congenital anomaly or distal MCA branch lesion. IMPRESSION: No acute LEFT hemisphere event is identified. Subacute RIGHT hemisphere nonhemorrhagic infarction. Stable mild chronic microvascular ischemic change. No intracranial stenosis or large vessel occlusion. Electronically Signed   By: Elsie Stain M.D.   On: 10/29/2015 11:57  Mr Maxine Glenn Head/brain TK Cm  Result Date: 10/29/2015 CLINICAL DATA:  Acute onset of RIGHT-sided weakness. History of stroke in June affecting the RIGHT hemisphere. EXAM: MRI HEAD WITHOUT CONTRAST MRA HEAD WITHOUT CONTRAST TECHNIQUE: Multiplanar, multiecho pulse sequences of the brain and surrounding structures were obtained without intravenous contrast. Angiographic images of the head were obtained using MRA technique without contrast. COMPARISON:  MRI brain 09/29/2015. MRI brain 09/25/2015. CT head 10/28/2015. FINDINGS: MRI HEAD FINDINGS  Restricted diffusion again noted in the RIGHT posterior frontal cortex and subcortical white matter, less intense compared with most recent prior, consistent with expected evolution/ subacute time course. No areas of acute infarction in the LEFT hemisphere are observed. No mass lesion, hydrocephalus, extra-axial fluid, or hemorrhage. Normal for age cerebral volume. Mild subcortical and periventricular T2 and FLAIR hyperintensities, likely chronic microvascular ischemic change. No midline abnormality. Flow voids are maintained. Extracranial soft tissues unremarkable. MRA HEAD FINDINGS Widely patent internal carotid arteries, basilar artery, and both vertebral arteries. Dolichoectatic intracranial vasculature without stenosis or occlusion. No intracranial aneurysm. No congenital anomaly or distal MCA branch lesion. IMPRESSION: No acute LEFT hemisphere event is identified. Subacute RIGHT hemisphere nonhemorrhagic infarction. Stable mild chronic microvascular ischemic change. No intracranial stenosis or large vessel occlusion. Electronically Signed   By: Elsie Stain M.D.   On: 10/29/2015 11:57  Ct Head Code Stroke W/o Cm  Result Date: 10/28/2015 CLINICAL DATA:  55 year old female with right-sided weakness and ectasia. Code stroke. EXAM: CT HEAD WITHOUT CONTRAST TECHNIQUE: Contiguous axial images were obtained from the base of the skull through the vertex without intravenous contrast. COMPARISON:  Head CT dated 09/29/2015 an MRI dated 09/29/2015 FINDINGS: The ventricles and sulci appropriate size for patient's age. Minimal periventricular and deep white matter chronic microvascular ischemic changes may be present. There is no acute intracranial hemorrhage. No mass effect or midline shift. There is mild mucoperiosteal thickening of paranasal sinuses. No air-fluid levels. The mastoid air cells are clear. The calvarium is intact. IMPRESSION: No acute intracranial pathology If symptoms persist and there are no  contraindications, MRI may provide better evaluation if clinically  indicated These results were called by telephone at the time of interpretation on 10/28/2015 at 9:54 pm to Dr.Stewart, who verbally acknowledged these results. Electronically Signed   By: Elgie Collard M.D.   On: 10/28/2015 21:55   EEG on 7/31 Negative for seizure activity  Subjective: Seen an examined. Denies any weakness or slurred speech.  Discharge Exam: Vitals:   10/31/15 0616 10/31/15 1030  BP: 104/71 (!) 108/55  Pulse: 65 (!) 58  Resp: 18 18  Temp: 97.7 F (36.5 C) 98.3 F (36.8 C)   Vitals:   10/31/15 0228 10/31/15 0616 10/31/15 1030 10/31/15 1100  BP: (!) 98/50 104/71 (!) 108/55   Pulse: 65 65 (!) 58   Resp: 19 18 18    Temp: 97.7 F (36.5 C) 97.7 F (36.5 C) 98.3 F (36.8 C)   TempSrc: Oral Oral Oral   SpO2: 98% 98% 98%   Weight:    85 kg (187 lb 6.3 oz)  Height:    5\' 4"  (1.626 m)    Gen: not in distress HEENT: moist mucosa, supple neck Chest: clear b/l, no added sounds CVS: N S1&S2, no murmurs,  GI: soft, NT, ND, BS+ Musculoskeletal: warm, no edema CNS:  Alert and oriented. Nonfocal   The results of significant diagnostics from this hospitalization (including imaging, microbiology, ancillary and laboratory) are listed below for reference.     Microbiology: No results found for this or any previous visit (from the past 240 hour(s)).   Labs: BNP (last 3 results) No results for input(s): BNP in the last 8760 hours. Basic Metabolic Panel:  Recent Labs Lab 10/28/15 2148 10/28/15 2215 10/29/15 0216  NA 141 136  --   K 4.1 4.1  --   CL 106 107  --   CO2  --  21*  --   GLUCOSE 118* 117*  --   BUN 21* 17  --   CREATININE 0.80 0.82 0.75  CALCIUM  --  9.0  --    Liver Function Tests:  Recent Labs Lab 10/28/15 2215  AST 22  ALT 26  ALKPHOS 79  BILITOT 1.0  PROT 7.1  ALBUMIN 4.2   No results for input(s): LIPASE, AMYLASE in the last 168 hours. No results for input(s):  AMMONIA in the last 168 hours. CBC:  Recent Labs Lab 10/28/15 2148 10/28/15 2215 10/29/15 0216  WBC  --  10.8* 9.6  NEUTROABS  --  7.3  --   HGB 13.6 13.2 12.5  HCT 40.0 40.2 38.2  MCV  --  93.5 94.1  PLT  --  221 195   Cardiac Enzymes:  Recent Labs Lab 10/28/15 2215  TROPONINI <0.03   BNP: Invalid input(s): POCBNP CBG:  Recent Labs Lab 10/28/15 2142  GLUCAP 120*   D-Dimer No results for input(s): DDIMER in the last 72 hours. Hgb A1c No results for input(s): HGBA1C in the last 72 hours. Lipid Profile No results for input(s): CHOL, HDL, LDLCALC, TRIG, CHOLHDL, LDLDIRECT in the last 72 hours. Thyroid function studies No results for input(s): TSH, T4TOTAL, T3FREE, THYROIDAB in the last 72 hours.  Invalid input(s): FREET3 Anemia work up No results for input(s): VITAMINB12, FOLATE, FERRITIN, TIBC, IRON, RETICCTPCT in the last 72 hours. Urinalysis    Component Value Date/Time   COLORURINE YELLOW 10/29/2015 0649   APPEARANCEUR HAZY (A) 10/29/2015 0649   LABSPEC 1.020 10/29/2015 0649   PHURINE 5.0 10/29/2015 0649   GLUCOSEU NEGATIVE 10/29/2015 0649   HGBUR NEGATIVE 10/29/2015 0649   BILIRUBINUR  NEGATIVE 10/29/2015 0649   KETONESUR NEGATIVE 10/29/2015 0649   PROTEINUR NEGATIVE 10/29/2015 0649   UROBILINOGEN 0.2 04/24/2014 1435   NITRITE NEGATIVE 10/29/2015 0649   LEUKOCYTESUR NEGATIVE 10/29/2015 0649   Sepsis Labs Invalid input(s): PROCALCITONIN,  WBC,  LACTICIDVEN Microbiology No results found for this or any previous visit (from the past 240 hour(s)).   Time coordinating discharge:< 30 minutes  SIGNED:   Eddie North, MD  [facility] 10/31/2015, 1:00 PM Pager   If 7PM-7AM, please contact night-coverage [email or web address] Password [email or web address]

## 2015-10-31 NOTE — Progress Notes (Signed)
Discharge orders received, Pt for discharge home today. IV d/c'd. D/c instructions and RX given with verbalized understanding. Family at bedside to assist patient with discharge. Patient walked down stairs by CNA.

## 2015-10-31 NOTE — Progress Notes (Signed)
SLP Cancellation Note  Patient Details Name: ZAMIA CAMMER MRN: [id number] DOB: 07-18-1960   Cancelled treatment:       Reason Eval/Treat Not Completed: SLP screened, no needs identified, will sign off   Ife Vitelli, Riley Nearing 10/31/2015, 3:19 PM

## 2015-10-31 NOTE — Care Management Note (Signed)
Case Management Note  Patient Details  Name: Jenna Delgado MRN: [id number] Date of Birth: Aug 21, 1960  Subjective/Objective:                    Action/Plan: Pt discharging home with self care. MD placed ambulatory order for Outpatient therapy in [facility]. No further needs per CM.  Expected Discharge Date:                  Expected Discharge Plan:  Home/Self Care  In-House Referral:     Discharge planning Services  CM Consult  Post Acute Care Choice:    Choice offered to:     DME Arranged:    DME Agency:     HH Arranged:    HH Agency:     Status of Service:  Completed, signed off  If discussed at [employer] of Stay Meetings, dates discussed:    Additional Comments:  Kermit Balo, RN 10/31/2015, 2:02 PM

## 2015-10-31 NOTE — Progress Notes (Signed)
Adult sleep deprived EEG completed, results pending. 

## 2015-11-01 NOTE — Progress Notes (Signed)
STROKE TEAM PROGRESS NOTE   SUBJECTIVE (INTERVAL HISTORY) Her husband is at the bedside.  Overall she feels her condition is back to baseline. She and her husband admitted recent stress at home and at their lives. Husband recently had abdomen surgery but not smooth with some medical error on him. Pt was called back from work by husband due to upset about hot shower water. Pt has frequent argument at home with husband. Difficulty focusing.    OBJECTIVE Temp:  [98.1 F (36.7 C)-98.3 F (36.8 C)] 98.1 F (36.7 C) (07/31 1314) Pulse Rate:  [58-64] 64 (07/31 1314) Cardiac Rhythm: Normal sinus rhythm (07/31 0700) Resp:  [18] 18 (07/31 1314) BP: (108-111)/(55-71) 111/71 (07/31 1314) SpO2:  [98 %-100 %] 100 % (07/31 1314) Weight:  [187 lb 6.3 oz (85 kg)] 187 lb 6.3 oz (85 kg) (07/31 1100)  CBC:   Recent Labs Lab 10/28/15 2215 10/29/15 0216  WBC 10.8* 9.6  NEUTROABS 7.3  --   HGB 13.2 12.5  HCT 40.2 38.2  MCV 93.5 94.1  PLT 221 195    Basic Metabolic Panel:   Recent Labs Lab 10/28/15 2148 10/28/15 2215 10/29/15 0216  NA 141 136  --   K 4.1 4.1  --   CL 106 107  --   CO2  --  21*  --   GLUCOSE 118* 117*  --   BUN 21* 17  --   CREATININE 0.80 0.82 0.75  CALCIUM  --  9.0  --     Lipid Panel:     Component Value Date/Time   CHOL 150 09/30/2015 0820   TRIG 111 09/30/2015 0820   HDL 51 09/30/2015 0820   CHOLHDL 2.9 09/30/2015 0820   VLDL 22 09/30/2015 0820   LDLCALC 77 09/30/2015 0820   HgbA1c:  Lab Results  Component Value Date   HGBA1C 6.0 (H) 09/30/2015   Urine Drug Screen:     Component Value Date/Time   LABOPIA NONE DETECTED 10/29/2015 0648   COCAINSCRNUR NONE DETECTED 10/29/2015 0648   LABBENZ NONE DETECTED 10/29/2015 0648   AMPHETMU NONE DETECTED 10/29/2015 0648   THCU POSITIVE (A) 10/29/2015 0648   LABBARB NONE DETECTED 10/29/2015 0648      IMAGING I have personally reviewed the radiological images below and agree with the radiology  interpretations.  Mr Maxine Glenn Head/brain Wo Cm 10/29/2015 No acute LEFT hemisphere event is identified. Subacute RIGHT hemisphere nonhemorrhagic infarction. Stable mild chronic microvascular ischemic change. No intracranial stenosis or large vessel occlusion.   Ct Head Code Stroke W/o Cm 10/28/2015 No acute intracranial pathology If symptoms persist and there are no contraindications, MRI may provide better evaluation if clinically indicated   EEG -  This awake and and asleep sleep-deprived EEG is normal.   Clinical Correlation: A normal EEG does not exclude a clinical diagnosis of epilepsy.  If further clinical questions remain, prolonged EEG may be helpful.  Clinical correlation is advised.  CUS 09/2015 - Bilateral: 1-39% ICA stenosis. Vertebral artery flow is antegrade.  LE venous doppler 09/2015 - no DVT   Loop interrogation - no Afib noted   PHYSICAL EXAM HEENT-  Normocephalic, no lesions, without obvious abnormality.  Normal external eye and conjunctiva.   Neck supple with no masses, nodes, nodules or enlargement. Cardiovascular - regular rate and rhythm, S1, S2 normal, no murmur, click, rub or gallop Lungs - chest clear, no wheezing, rales, normal symmetric air entry Abdomen - soft, non-tender; bowel sounds normal; no masses,  no organomegaly Extremities -  no joint deformities, effusion, or inflammation and no edema  Neurologic Examination: Mental Status: Alert, oriented, thought content appropriate.  No word finding difficulty was noted. Able to follow commands without difficulty. Cranial Nerves: II-Visual fields were normal. III/IV/VI-Pupils were equal and reacted normally to light. Extraocular movements were full and conjugate.  V/VII-no facial numbness and no facial weakness. VIII-normal. X-minimal dysarthria. XI: trapezius strength/neck flexion strength normal bilaterally XII-midline tongue extension with normal strength. Motor: 5/5 throughout, motor exam  unremarkable. Sensory: Normal throughout. Deep Tendon Reflexes: 2+ and symmetric. Plantars: Flexor bilaterally Cerebellar: normal coordination bilaterally. Carotid auscultation: Normal  ASSESSMENT/PLAN Ms. SHAMARIAH SHEWMAKE is a 55 y.o. female with history of hyperlipidemia, diabetes mellitus, stroke approximately 4 weeks ago, and bipolar disorder presenting with right upper extremity weakness and speech difficulties.  She did not receive IV t-PA due to recent stroke.  Anxiety - no new stroke. Pt and family report recent stress at home.  Resultant  Right side deficit resolved - not fit to previous stroke.  MRI  No new acute infarct. Subacute RIGHT hemisphere nonhemorrhagic infarction.  MRA  no large vessel stenosis or occlusion  Carotid Doppler - 09/26/2015 - unremarkable  2D Echo - 10/03/2015 - EF 60-65%. Cardiac source of emboli identified.  LE venous doppler 09/2015 - no DVT  Loop interrogation - neg for afib  LDL - 09/30/2015 - 77  HgbA1c - 6.0   VTE prophylaxis - Lovenox  aspirin 325 mg daily prior to admission, now on aspirin 325 mg daily. Continue ASA on discharge  Patient counseled to be compliant with her antithrombotic medications  Ongoing aggressive stroke risk factor management  Therapy recommendations: Outpatient occupational therapy  Disposition:  home  Hypertension  Blood pressure tends to run low. Currently not on antihypertensive medications.  Permissive hypertension (OK if < 220/120) but gradually normalize in 5-7 days  Long-term BP goal normotensive  Hyperlipidemia  Home meds:  Lipitor 80 mg daily resumed in hospital  LDL 77, goal < 70  Continue statin at discharge  Diabetes  HgbA1c 6.0, goal < 7.0  Controlled  SSI  Other Stroke Risk Factors  ETOH use, advised to drink no more than 1 drink(s) a day  Obesity, Body mass index is 32.17 kg/m., recommend weight loss, diet and exercise as appropriate   Hx stroke/TIA  THC use - UDS  positive for [facility]   Hospital day # 0  Neurology will sign off. Please call with questions. Pt will follow up with Dr. Pearlean Brownie at [facility] on 11/17/15. Thanks for the consult.   Marvel Plan, MD PhD Stroke Neurology 11/01/2015 6:40 AM    To contact Stroke Continuity provider, please refer to [email or web address]. After hours, contact General Neurology

## 2015-11-02 ENCOUNTER — Ambulatory Visit (INDEPENDENT_AMBULATORY_CARE_PROVIDER_SITE_OTHER): Payer: BLUE CROSS/BLUE SHIELD | Admitting: *Deleted

## 2015-11-02 DIAGNOSIS — I639 Cerebral infarction, unspecified: Secondary | ICD-10-CM | POA: Diagnosis not present

## 2015-11-04 NOTE — Progress Notes (Signed)
Carelink Summary Report / Loop Recorder 

## 2015-11-17 ENCOUNTER — Ambulatory Visit (INDEPENDENT_AMBULATORY_CARE_PROVIDER_SITE_OTHER): Payer: BLUE CROSS/BLUE SHIELD | Admitting: Neurology

## 2015-11-17 ENCOUNTER — Encounter: Payer: Self-pay | Admitting: Neurology

## 2015-11-17 VITALS — BP 100/67 | HR 82 | Ht 64.0 in | Wt 190.4 lb

## 2015-11-17 DIAGNOSIS — R0683 Snoring: Secondary | ICD-10-CM

## 2015-11-17 DIAGNOSIS — I639 Cerebral infarction, unspecified: Secondary | ICD-10-CM

## 2015-11-17 NOTE — Progress Notes (Signed)
[facility] [address] [place]. [place] [address] [phone]       OFFICE FOLLOW-UP NOTE  Jenna Delgado Date of Birth:  27-Sep-1960 Medical Record Number:  [id number]   HPI:  14 year Caucasian lady seen today for the first office follow-up visit following hospital admission for stroke in June 2017 and TIA in July 2017.Jenna Delgado is a 55 y.o. female who had not yet gone to sleep around 2 AM 09/25/2015 when she started noticing that her left side felt funny. She states that it didn't feel numb exactly, but that he just didn't seem to do what it was supposed to do. He thought that she might have been laying on it funny. She went to sleep and when she awoke it was still like that and therefore she presented to the emergency room today. He states that it feels like a static deficit. She denies other symptoms but does state that she has been under a lot of stress. She has had a lot of flying over the past few weeks. Patient was not administered IV t-PA secondary to delay in arrival. She was admitted  for further evaluation and treatment. Transthoraxic echo showed normal ejection fraction. Transesophageal echo also showed no cardiac source of embolism, PFO. Lower extremity venous Dopplers were negative for DVT. Carotid Doppler showed no significant extracranial stenosis. Hypercoagulable panel labs were negative. ANA and ESR were normal. Hemoglobin A1c was 5.7. LDL cholesterol was elevated at 124 mg percent. Patient was started on aspirin and Lipitor for stroke prevention. She returned a few days later with recurrent symptoms of worsening left-sided deficits. This time repeat MRI scan of the brain obtained on 09/29/15 as well as on 7/99/17 showed no acute infarct and only evolutionary changes in the right frontal infarcts. Patient had a loop recorder inserted and so for paroxysmal atrial fibrillation has not yet been found. Patient stated that she possibly could have had on the  TIA for a few days ago. She woke up in the middle of the [place]. She felt a feeling of ice water on the right side last only 5 minutes and recovered completely. She did not seek medical help. Today she states she did not have a good day she feels weak and tired. A blood pressure is running 100/67 but she states that this usually is low. She is distorting aspirin without bleeding but does get easy bruising. She states Lipitor is causing some hip pain. She does snore and does not get good sleep and husband feels she may have sleep apnea.  ROS:   14 system review of systems is positive for confusion, headache, dizziness, insomnia, snoring, restless legs, shortness of breath and all other systems negative PMH:  Past Medical History:  Diagnosis Date  . Asthma   . Diabetes mellitus without complication ([place])   . Mild bipolar disorder ([place])   . Prediabetes   . Seasonal allergic rhinitis   . Stroke ([place])   . Vitamin D deficiency     Social History:  Social History   Social History  . Marital status: Married    Spouse name: N/A  . Number of children: N/A  . Years of education: N/A   Occupational History  . Not on file.   Social History Main Topics  . Smoking status: Never Smoker  . Smokeless tobacco: Never Used  . Alcohol use 1.2 oz/week    1 Standard drinks or equivalent, 1 Cans  of beer per week     Comment: occasionally  . Drug use: No  . Sexual activity: Not on file   Other Topics Concern  . Not on file   Social History Narrative  . No narrative on file    Medications:   Current Outpatient Prescriptions on File Prior to Visit  Medication Sig Dispense Refill  . aspirin 325 MG tablet Take 1 tablet (325 mg total) by mouth daily. 30 tablet 0  . atorvastatin (LIPITOR) 80 MG tablet Take 1 tablet (80 mg total) by mouth daily at 6 PM. 30 tablet 1  . citalopram (CELEXA) 20 MG tablet Take 20 mg by mouth daily.    . clonazePAM (KLONOPIN) 0.5 MG  tablet Take 0.5 mg by mouth daily as needed for anxiety.     [name] esomeprazole (NEXIUM) 20 MG capsule Take 20 mg by mouth daily as needed (for heartburn or acid reflux).      No current facility-administered medications on file prior to visit.     Allergies:   Allergies  Allergen Reactions  . Benadryl [Diphenhydramine Hcl] Other (See Comments)    Heartpounding, panic attack  . Imitrex [Sumatriptan] Other (See Comments)    Heartpounding, panic attack  . Iodine Other (See Comments)    IVP test problems  . Ivp Dye [Iodinated Diagnostic Agents] Other (See Comments)    IVP test problems  . Lobster [Shellfish Allergy] Itching  . Lamictal [Lamotrigine] Rash  . Tetracyclines & Related Rash    Blisters down the throat  . Theophyllines Rash    Target rash    Physical Exam General: well developed, well nourished middle-age Caucasian lady, seated, in no evident distress Head: head normocephalic and atraumatic.  Neck: supple with no carotid or supraclavicular bruits Cardiovascular: regular rate and rhythm, no murmurs Musculoskeletal: no deformity Skin:  no rash/petichiae Vascular:  Normal pulses all extremities Vitals:   11/17/15 1413  BP: 100/67  Pulse: 82   Neurologic Exam Mental Status: Awake and fully alert. Oriented to place and time. Recent and remote memory intact. Attention span, concentration and fund of knowledge appropriate. Mood and affect appropriate.  Cranial Nerves: Fundoscopic exam reveals sharp disc margins. Pupils equal, briskly reactive to light. Extraocular movements full without nystagmus. Visual fields full to confrontation. Hearing intact. Facial sensation intact. Face, tongue, palate moves normally and symmetrically.  Motor: Normal bulk and tone. Mild left hemiparesis 4/5 with weakness of left grip and intrinsic hand muscles. Left hip  Ankle dorsiflexors mildly weak. Sensory.: intact to touch ,pinprick .position and vibratory sensation.  Coordination: Rapid  alternating movements normal in all extremities. Finger-to-nose and heel-to-shin performed mild ataxia on left sidely. Gait and Station: Arises from chair without difficulty. Stance is normal. Gait demonstrates normal stride length and balance . Drives left feet when walking. Unable   to heel, toe and tandem walk without difficulty.  Reflexes: 1+ and symmetric. Toes downgoing.   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 Recurrent TIA in July 2017. Vascular risk factors of hyperlipidemia only    PLAN: I had a long d/w patient and husband about her recent cryptogenic  Stroke & TIA, risk for recurrent stroke/TIAs, personally independently reviewed imaging studies and stroke evaluation results and answered questions.Continue aspirin 325 mg daily  for secondary stroke prevention and maintain strict control of hypertension with blood pressure goal below 130/90, diabetes with  hemoglobin A1c goal below 6.5% and lipids with LDL cholesterol goal below 70 mg/dL. I also advised the patient to eat a healthy diet with plenty of whole grains, cereals, fruits and vegetables, exercise regularly and maintain ideal body weight .I advised her to drink enough fluids and to get adequate sleep and rest. Add coenzyme Q 10 for her statin arthralgias. Check polysomnogram for sleep apnea. Consider possible participation in the [place]  trial if interested. She will be given written information to review at home and decide. Followup in the future with me in  6 months or call earlier if necessary Greater than 50% of time during this 25 minute visit was spent on counseling,explanation of diagnosis, planning of further management, discussion with patient and family and coordination of care Antony Contras, MD  [facility] [address] [place], [place]  [address]  Phone [phone] Fax [phone] Note: This document was prepared with digital dictation and possible smart phrase technology. Any transcriptional errors that result from this process are unintentional

## 2015-11-17 NOTE — Patient Instructions (Signed)
I had a long d/w patient and husband about her recent cryptogenic  Stroke & TIA, risk for recurrent stroke/TIAs, personally independently reviewed imaging studies and stroke evaluation results and answered questions.Continue aspirin 325 mg daily  for secondary stroke prevention and maintain strict control of hypertension with blood pressure goal below 130/90, diabetes with hemoglobin A1c goal below 6.5% and lipids with LDL cholesterol goal below 70 mg/dL. I also advised the patient to eat a healthy diet with plenty of whole grains, cereals, fruits and vegetables, exercise regularly and maintain ideal body weight .I advised her to drink enough fluids and to get adequate sleep and rest. Add coenzyme Q 10 for her statin arthralgias. Check polysomnogram for sleep apnea. Consider possible participation in the [employer]  trial if interested. She will be given written information to review at home and decide. Followup in the future with me in  6 months or call earlier if necessary Stroke Prevention Some medical conditions and behaviors are associated with an increased chance of having a stroke. You may prevent a stroke by making healthy choices and managing medical conditions. HOW CAN I REDUCE MY RISK OF HAVING A STROKE?   Stay physically active. Get at least 30 minutes of activity on most or all days.  Do not smoke. It may also be helpful to avoid exposure to secondhand smoke.  Limit alcohol use. Moderate alcohol use is considered to be:  No more than 2 drinks per day for men.  No more than 1 drink per day for nonpregnant women.  Eat healthy foods. This involves:  Eating 5 or more servings of fruits and vegetables a day.  Making dietary changes that address high blood pressure (hypertension), high cholesterol, diabetes, or obesity.  Manage your cholesterol levels.  Making food choices that are high in fiber and low in saturated fat, trans fat, and cholesterol may control cholesterol  levels.  Take any prescribed medicines to control cholesterol as directed by your health care provider.  Manage your diabetes.  Controlling your carbohydrate and sugar intake is recommended to manage diabetes.  Take any prescribed medicines to control diabetes as directed by your health care provider.  Control your hypertension.  Making food choices that are low in salt (sodium), saturated fat, trans fat, and cholesterol is recommended to manage hypertension.  Ask your health care provider if you need treatment to lower your blood pressure. Take any prescribed medicines to control hypertension as directed by your health care provider.  If you are 4318-56 years of age, have your blood pressure checked every 3-5 years. If you are 56 years of age or older, have your blood pressure checked every year.  Maintain a healthy weight.  Reducing calorie intake and making food choices that are low in sodium, saturated fat, trans fat, and cholesterol are recommended to manage weight.  Stop drug abuse.  Avoid taking birth control pills.  Talk to your health care provider about the risks of taking birth control pills if you are over 56 years old, smoke, get migraines, or have ever had a blood clot.  Get evaluated for sleep disorders (sleep apnea).  Talk to your health care provider about getting a sleep evaluation if you snore a lot or have excessive sleepiness.  Take medicines only as directed by your health care provider.  For some people, aspirin or blood thinners (anticoagulants) are helpful in reducing the risk of forming abnormal blood clots that can lead to stroke. If you have the irregular heart rhythm  of atrial fibrillation, you should be on a blood thinner unless there is a good reason you cannot take them.  Understand all your medicine instructions.  Make sure that other conditions (such as anemia or atherosclerosis) are addressed. SEEK IMMEDIATE MEDICAL CARE IF:   You have sudden  weakness or numbness of the face, arm, or leg, especially on one side of the body.  Your face or eyelid droops to one side.  You have sudden confusion.  You have trouble speaking (aphasia) or understanding.  You have sudden trouble seeing in one or both eyes.  You have sudden trouble walking.  You have dizziness.  You have a loss of balance or coordination.  You have a sudden, severe headache with no known cause.  You have new chest pain or an irregular heartbeat. Any of these symptoms may represent a serious problem that is an emergency. Do not wait to see if the symptoms will go away. Get medical help at once. Call your local emergency services (911 in [place] Do not drive yourself to the hospital.   This information is not intended to replace advice given to you by your health care provider. Make sure you discuss any questions you have with your health care provider.   Document Released: 04/26/2004 Document Revised: 04/09/2014 Document Reviewed: 09/19/2012 Elsevier Interactive Patient Education [employer]2016 Elsevier Inc.

## 2015-11-27 LAB — CUP PACEART REMOTE DEVICE CHECK: MDC IDC SESS DTM: 20170802170616

## 2015-12-01 ENCOUNTER — Ambulatory Visit (INDEPENDENT_AMBULATORY_CARE_PROVIDER_SITE_OTHER): Payer: BLUE CROSS/BLUE SHIELD | Admitting: Neurology

## 2015-12-01 ENCOUNTER — Encounter: Payer: Self-pay | Admitting: Neurology

## 2015-12-01 VITALS — BP 108/68 | HR 70 | Resp 16 | Ht 64.0 in | Wt 188.0 lb

## 2015-12-01 DIAGNOSIS — G4761 Periodic limb movement disorder: Secondary | ICD-10-CM

## 2015-12-01 DIAGNOSIS — R0681 Apnea, not elsewhere classified: Secondary | ICD-10-CM

## 2015-12-01 DIAGNOSIS — G2581 Restless legs syndrome: Secondary | ICD-10-CM | POA: Diagnosis not present

## 2015-12-01 DIAGNOSIS — G458 Other transient cerebral ischemic attacks and related syndromes: Secondary | ICD-10-CM | POA: Diagnosis not present

## 2015-12-01 DIAGNOSIS — R0683 Snoring: Secondary | ICD-10-CM | POA: Diagnosis not present

## 2015-12-01 DIAGNOSIS — I639 Cerebral infarction, unspecified: Secondary | ICD-10-CM | POA: Diagnosis not present

## 2015-12-01 DIAGNOSIS — E669 Obesity, unspecified: Secondary | ICD-10-CM | POA: Diagnosis not present

## 2015-12-01 NOTE — Patient Instructions (Signed)

## 2015-12-01 NOTE — Progress Notes (Signed)
Subjective:    Patient ID: Jenna Delgado is a 55 y.o. female.  HPI     Huston FoleySaima Nechelle Petrizzo, MD, PhD [facility] [address], [address] [address] [place], [place] [id number]  Dear Jenna ShyPramod,   I saw your patient, Jenna RistKaren Delgado, upon your kind request in my clinic today for initial consultation of her sleep disorder, in particular, concern for underlying obstructive sleep apnea. The patient is unaccompanied today. As you know, [name] is a 55 year old right-handed woman with an underlying medical history of diabetes, asthma, allergic rhinitis, vitamin D deficiency, mood disorder, and stroke in June 2017 and TIA in July 2017, as well as obesity, who reports snoring and excessive daytime somnolence. I reviewed your office note from 11/17/2015. She has trouble initiating and maintaining sleep, she reports restless leg symptoms and has had these for years. She also moves her legs and her sleep and this disturbs her husband sometimes including her snoring, so often they do not sleep in the same bed. He also snores. Since her stroke, she has had more fatigue and gets sleepy more quickly. Her Epworth sleepiness score is 10 out of 24 today, her fatigue score is 29 out of 63, bedtime varies, between 9 and 11 PM usually, wakeup time around 8 or 9 AM. 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 She has residual left-sided weakness particularly in her left hand since her stroke. She quit smoking over 15 years ago, drinks alcohol occasionally, caffeine occasionally. Her husband has noted occasional apneic pauses in her sleep but is not fully sure she states. She does not know her family history. She was adopted.  Her Past Medical History Is Significant For: Past Medical History:  Diagnosis Date  . Asthma   . Diabetes mellitus without complication (HCC)   . Mild bipolar disorder (HCC)   . Prediabetes   . Seasonal allergic rhinitis   . Stroke (HCC)   . Vitamin D deficiency     Her Past  Surgical History Is Significant For: Past Surgical History:  Procedure Laterality Date  . ABDOMINAL HYSTERECTOMY    . BLADDER SUSPENSION    . EP IMPLANTABLE DEVICE N/A 10/03/2015   Procedure: Loop Recorder Insertion;  Surgeon: Jenna MawGregg W Taylor, MD;  [facility]: [facility];  Service: Cardiovascular;  Laterality: N/A;  . TEE WITHOUT CARDIOVERSION N/A 10/03/2015   Procedure: TRANSESOPHAGEAL ECHOCARDIOGRAM (TEE);  Surgeon: Jenna MixerPhilip J Nahser, MD;  [facility];  Service: Cardiovascular;  Laterality: N/A;    Her Family History Is Significant For: Family History  Problem Relation Age of Onset  . Adopted: Yes    Her Social History Is Significant For: Social History   Social History  . Marital status: Married    Spouse name: N/A  . Number of children: N/A  . Years of education: N/A   Social History Main Topics  . Smoking status: Never Smoker  . Smokeless tobacco: Never Used  . Alcohol use 1.2 oz/week    1 Standard drinks or equivalent, 1 Cans of beer per week     Comment: occasionally  . Drug use: No  . Sexual activity: Not Asked   Other Topics Concern  . None   Social History Narrative   Drinks very little caffeine     Her Allergies Are:  Allergies  Allergen Reactions  . Benadryl [Diphenhydramine Hcl] Other (See Comments)    Heartpounding, panic attack  . Imitrex [Sumatriptan] Other (See Comments)    Heartpounding, panic attack  .  Iodine Other (See Comments)    IVP test problems  . Ivp Dye [Iodinated Diagnostic Agents] Other (See Comments)    IVP test problems  . Lobster [Shellfish Allergy] Itching  . Lamictal [Lamotrigine] Rash  . Tetracyclines & Related Rash    Blisters down the throat  . Theophyllines Rash    Target rash  :   Her Current Medications Are:  Outpatient Encounter Prescriptions as of 12/01/2015  Medication Sig  . aspirin 325 MG tablet Take 1 tablet (325 mg total) by mouth daily.  [name] atorvastatin (LIPITOR) 80 MG tablet Take 1 tablet (80 mg  total) by mouth daily at 6 PM.  . citalopram (CELEXA) 20 MG tablet Take 20 mg by mouth daily.  . clonazePAM (KLONOPIN) 0.5 MG tablet Take 0.5 mg by mouth daily as needed for anxiety.   [name] esomeprazole (NEXIUM) 20 MG capsule Take 20 mg by mouth daily as needed (for heartburn or acid reflux).    No facility-administered encounter medications on file as of 12/01/2015.   :  Review of Systems:  Out of a complete 14 point review of systems, all are reviewed and negative with the exception of these symptoms as listed below:  Review of Systems  Neurological:       Patient has trouble falling and staying sleep, snoring, witnessed apnea, wakes up tired   Epworth Sleepiness Scale 0= would never doze 1= slight chance of dozing 2= moderate chance of dozing 3= high chance of dozing  Sitting and reading:2 Watching TV:2 Sitting inactive in a public place (ex. Theater or meeting):1 As a passenger in a car for an hour without a break:1 Lying down to rest in the afternoon:3 Sitting and talking to someone:0 Sitting quietly after lunch (no alcohol):1 In a car, while stopped in traffic:0 Total:10  Objective:  Neurologic Exam  Physical Exam Physical Examination:   Vitals:   12/01/15 1012  BP: 108/68  Pulse: 70  Resp: 16    General Examination: The patient is a very pleasant 55 y.o. female in no acute distress. She appears well-developed and well-nourished and well groomed.   HEENT: Normocephalic, atraumatic, pupils are equal, round and reactive to light and accommodation. B/l esotropia noted. Funduscopic exam is normal with sharp disc margins noted. Extraocular tracking is good without limitation to gaze excursion or nystagmus noted. Normal smooth pursuit is noted. Hearing is grossly intact. Face is symmetric with normal facial animation and normal facial sensation. Speech is clear with no dysarthria noted. There is no hypophonia. There is no lip, neck/head, jaw or voice tremor. Neck is supple  with full range of passive and active motion. There are no carotid bruits on auscultation. Oropharynx exam reveals: mild mouth dryness, adequate dental hygiene and mild airway crowding, due to smaller airway entry. Mallampati is class II. Tongue protrudes centrally and palate elevates symmetrically. Tonsils are small. Neck size is 14.5 inches. She has a Moderate overbite. Nasal inspection reveals no significant nasal mucosal bogginess or redness and no septal deviation.   Chest: Clear to auscultation without wheezing, rhonchi or crackles noted.  Heart: S1+S2+0, regular and normal without murmurs, rubs or gallops noted.   Abdomen: Soft, non-tender and non-distended with normal bowel sounds appreciated on auscultation.  Extremities: There is no pitting edema in the distal lower extremities bilaterally. Pedal pulses are intact.  Skin: Warm and dry without trophic changes noted.   Musculoskeletal: exam reveals no obvious joint deformities, tenderness or joint swelling or erythema.   Neurologically:  Mental status:  The patient is awake, alert and oriented in all 4 spheres. Her immediate and remote memory, attention, language skills and fund of knowledge are appropriate. There is no evidence of aphasia, agnosia, apraxia or anomia. Speech is clear with normal prosody and enunciation. Thought process is linear. Mood is normal and affect is normal.  Cranial nerves II - XII are as described above under HEENT exam. In addition: shoulder shrug is normal with equal shoulder height noted. Motor exam: Normal bulk, strength and tone is noted on the right, with 4+ out of 5 weakness on the left noted. Romberg is negative. Reflexes are 2+ throughout. fine motor skills and coordination: intact on the right and slightly difficult with the left, with slight past pointing noted with finger to nose testing on the L.  Sensory exam: intact to light touch, pinprick, vibration, temperature in the upper and lower extremities.   Gait, station and balance: She stands easily. No veering to one side is noted. No leaning to one side is noted. Posture is age-appropriate and stance is narrow based. Gait shows mild caution and slight slowness.  Assessment and Plan:  In summary, REGINE CHRISTIAN is a very pleasant 55 y.o.-year old female with an underlying medical history of diabetes, asthma, allergic rhinitis, vitamin D deficiency, RLS, mood disorder, and stroke in June 2017 and TIA in July 2017, as well as obesity, whose history and physical exam are indeed concerning for obstructive sleep apnea (OSA). She endorses RLS symptoms, longstanding and PLMs. I had a long chat with the patient about my findings and the diagnosis of OSA, its prognosis and treatment options. We talked about medical treatments, surgical interventions and non-pharmacological approaches. I explained in particular the risks and ramifications of untreated moderate to severe OSA, especially with respect to developing cardiovascular disease down the Road, including congestive heart failure, difficult to treat hypertension, cardiac arrhythmias, or stroke. Even type 2 diabetes has, in part, been linked to untreated OSA. Symptoms of untreated OSA include daytime sleepiness, memory problems, mood irritability and mood disorder such as depression and anxiety, lack of energy, as well as recurrent headaches, especially morning headaches. We talked about trying to maintain a healthy lifestyle in general, as well as the importance of weight control. I encouraged the patient to eat healthy, exercise daily and keep well hydrated, to keep a scheduled bedtime and wake time routine, to not skip any meals and eat healthy snacks in between meals. I advised the patient not to drive when feeling sleepy. I recommended the following at this time: sleep study  with potential positive airway pressure titration. (We will score hypopneas at 3% and split the sleep study into diagnostic and  treatment portion, if the estimated. 2 hour AHI is >15/h).   I explained the sleep test procedure to the patient and also outlined possible surgical and non-surgical treatment options of OSA, including the use of a custom-made dental device (which would require a referral to a specialist dentist or oral surgeon), upper airway surgical options, such as pillar implants, radiofrequency surgery, tongue base surgery, and UPPP (which would involve a referral to an ENT surgeon). Rarely, jaw surgery such as mandibular advancement may be considered.  I also explained the CPAP treatment option to the patient, who indicated that she would be willing to try CPAP if the need arises. I explained the importance of being compliant with PAP treatment, not only for insurance purposes but primarily to improve Her symptoms, and for the patient's long term health benefit,  including to reduce Her cardiovascular risks. I answered all her questions today and the patient was in agreement. I would like to see her back after the sleep study is completed and encouraged her to call with any interim questions, concerns, problems or updates.   Thank you very much for allowing me to participate in the care of this nice patient. If I can be of any further assistance to you please do not hesitate to talk to me.   Sincerely,   Huston Foley, MD, PhD

## 2015-12-02 ENCOUNTER — Ambulatory Visit (INDEPENDENT_AMBULATORY_CARE_PROVIDER_SITE_OTHER): Payer: BLUE CROSS/BLUE SHIELD | Admitting: *Deleted

## 2015-12-02 DIAGNOSIS — I639 Cerebral infarction, unspecified: Secondary | ICD-10-CM | POA: Diagnosis not present

## 2015-12-02 NOTE — Progress Notes (Signed)
Carelink Summary Report / Loop Recorder 

## 2015-12-12 ENCOUNTER — Telehealth: Payer: Self-pay | Admitting: Neurology

## 2015-12-12 ENCOUNTER — Emergency Department (HOSPITAL_COMMUNITY)
Admission: EM | Admit: 2015-12-12 | Discharge: 2015-12-12 | Disposition: A | Discharge status: Home / Self Care | Payer: BLUE CROSS/BLUE SHIELD | Attending: Emergency Medicine | Admitting: Emergency Medicine

## 2015-12-12 ENCOUNTER — Emergency Department (HOSPITAL_COMMUNITY): Payer: BLUE CROSS/BLUE SHIELD

## 2015-12-12 DIAGNOSIS — H539 Unspecified visual disturbance: Secondary | ICD-10-CM | POA: Diagnosis not present

## 2015-12-12 DIAGNOSIS — R4182 Altered mental status, unspecified: Secondary | ICD-10-CM | POA: Diagnosis present

## 2015-12-12 DIAGNOSIS — Z7982 Long term (current) use of aspirin: Secondary | ICD-10-CM | POA: Insufficient documentation

## 2015-12-12 DIAGNOSIS — Z8673 Personal history of transient ischemic attack (TIA), and cerebral infarction without residual deficits: Secondary | ICD-10-CM | POA: Insufficient documentation

## 2015-12-12 DIAGNOSIS — J45909 Unspecified asthma, uncomplicated: Secondary | ICD-10-CM | POA: Diagnosis not present

## 2015-12-12 DIAGNOSIS — Z79899 Other long term (current) drug therapy: Secondary | ICD-10-CM | POA: Diagnosis not present

## 2015-12-12 DIAGNOSIS — E119 Type 2 diabetes mellitus without complications: Secondary | ICD-10-CM | POA: Diagnosis not present

## 2015-12-12 DIAGNOSIS — G43109 Migraine with aura, not intractable, without status migrainosus: Secondary | ICD-10-CM | POA: Diagnosis not present

## 2015-12-12 LAB — I-STAT CHEM 8, ED
BUN: 16 mg/dL (ref 6–20)
CALCIUM ION: 1.24 mmol/L (ref 1.15–1.40)
CHLORIDE: 105 mmol/L (ref 101–111)
Creatinine, Ser: 0.7 mg/dL (ref 0.44–1.00)
GLUCOSE: 89 mg/dL (ref 65–99)
HCT: 41 % (ref 36.0–46.0)
Hemoglobin: 13.9 g/dL (ref 12.0–15.0)
POTASSIUM: 3.7 mmol/L (ref 3.5–5.1)
SODIUM: 142 mmol/L (ref 135–145)
TCO2: 25 mmol/L (ref 0–100)

## 2015-12-12 LAB — CBG MONITORING, ED: GLUCOSE-CAPILLARY: 90 mg/dL (ref 65–99)

## 2015-12-12 LAB — PROTIME-INR
INR: 0.98
Prothrombin Time: 13 seconds (ref 11.4–15.2)

## 2015-12-12 LAB — I-STAT TROPONIN, ED: TROPONIN I, POC: 0 ng/mL (ref 0.00–0.08)

## 2015-12-12 MED ORDER — SODIUM CHLORIDE 0.9 % IV SOLN
1000.0000 mL | Freq: Once | INTRAVENOUS | Status: AC
  Administered 2015-12-12: 1000 mL via INTRAVENOUS

## 2015-12-12 MED ORDER — METOCLOPRAMIDE HCL 5 MG/ML IJ SOLN
10.0000 mg | Freq: Once | INTRAMUSCULAR | Status: AC
  Administered 2015-12-12: 10 mg via INTRAVENOUS
  Filled 2015-12-12: qty 2

## 2015-12-12 MED ORDER — BENZTROPINE MESYLATE 1 MG/ML IJ SOLN
1.0000 mg | Freq: Once | INTRAMUSCULAR | Status: AC
  Administered 2015-12-12: 1 mg via INTRAVENOUS
  Filled 2015-12-12: qty 2

## 2015-12-12 MED ORDER — KETOROLAC TROMETHAMINE 30 MG/ML IJ SOLN
30.0000 mg | Freq: Once | INTRAMUSCULAR | Status: AC
Start: 2015-12-12 — End: 2015-12-12
  Administered 2015-12-12: 30 mg via INTRAVENOUS
  Filled 2015-12-12: qty 1

## 2015-12-12 MED ORDER — SODIUM CHLORIDE 0.9 % IV SOLN
1000.0000 mL | INTRAVENOUS | Status: DC
  Administered 2015-12-12: 1000 mL via INTRAVENOUS

## 2015-12-12 MED ORDER — METOCLOPRAMIDE HCL 5 MG/ML IJ SOLN: 10.0000 mg | Freq: Once | INTRAMUSCULAR | Status: DC

## 2015-12-12 NOTE — Telephone Encounter (Signed)
RN was skype. Message was sent about pt thinks she had a stroke Thursday or Friday. Pt was advise to seek the ED for work up and call back for an appt with Dr. Pearlean BrownieSethi.

## 2015-12-12 NOTE — ED Notes (Signed)
Patient transported to MRI 

## 2015-12-12 NOTE — Telephone Encounter (Signed)
Pt called in wanting an appt. I asked her why and pt states she thinks she has had a stroke either Thursday or Friday of last week, today is Monday. I urged the pt to go to the ER and not make appt with the office. Pt was adamant to make appt, however I skyped nurse to ask her opinion. Nurse also said the pt is to go to the hospital. After relaying message to the pt she was agreeable to go to ER.

## 2015-12-12 NOTE — ED Notes (Signed)
Pt on MRI. ?

## 2015-12-12 NOTE — ED Triage Notes (Signed)
Pt states she is confused and can't think clearly.  Hx of previous stroke that affecting her L side.  She states around 1430 she couldn't "look at a 3 dimentional object and figure out what side it was".   Pt is neuro intact.  States L frontal headache  Pt has difficulty stating her birthday.

## 2015-12-12 NOTE — ED Notes (Addendum)
Pt husband approached nursing station asking for discharge papers stating "she is really ready to leave, there is nothing going on with her medically right now and there is no reason for her to still be here. We are leaving soon". Dr. Preston FleetingGlick made aware and in room speaking to pt and husband and pt this time.

## 2015-12-12 NOTE — ED Provider Notes (Signed)
[facility]-EM[facility] DEPT Provider Note   CSN: [id number] Arrival date & time: 12/12/15  1737     History   Chief Complaint Chief Complaint  Patient presents with  . Altered Mental Status  . Headache    HPI Jenna Delgado is a 55 y.o. female.  The history is provided by the patient and the spouse.  Altered Mental Status    Headache    She has history of stroke in June and transient ischemic attack in July. This afternoon, she noticed that she was having difficulty with spatial orientation. 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 This is distinct unusual for her. She is also feeling disoriented and confused. He was last known to be normal at 1351 she was on the phone with her husband. She feels her speech is normal currently, but he states that it is somewhat slow. She is not noticed any weakness. She has developed a right frontal headache over approximately the last hour with some photophobia. There is no nausea.  Past Medical History:  Diagnosis Date  . Asthma   . Diabetes mellitus without complication (HCC)   . Mild bipolar disorder (HCC)   . Prediabetes   . Seasonal allergic rhinitis   . Stroke (HCC)   . Vitamin D deficiency     Patient Active Problem List   Diagnosis Date Noted  . TIA (transient ischemic attack) 10/28/2015  . Cerebrovascular accident (CVA) (HCC)   . Abnormal LFTs   . Cerebral infarction due to unspecified mechanism   . Acute CVA (cerebrovascular accident) (HCC) 09/29/2015  . Abnormal liver function test 09/29/2015  . Asthma 09/29/2015  . Bipolar disorder (HCC) 09/29/2015  . Hyperlipidemia 09/26/2015  . Stroke (cerebrum) (HCC) 09/25/2015  . Hyperglycemia 09/25/2015  . Acute ischemic stroke [facility](HCC)     Past Surgical History:  Procedure Laterality Date  . ABDOMINAL HYSTERECTOMY    . BLADDER SUSPENSION    . EP IMPLANTABLE DEVICE N/A 10/03/2015   Procedure: Loop Recorder Insertion;  Surgeon: Marinus MawGregg W  Taylor, MD;  Location: [facility] INVASIVE CV LAB;  Service: Cardiovascular;  Laterality: N/A;  . TEE WITHOUT CARDIOVERSION N/A 10/03/2015   Procedure: TRANSESOPHAGEAL ECHOCARDIOGRAM (TEE);  Surgeon: Vesta MixerPhilip J Nahser, MD;  [facility];  Service: Cardiovascular;  Laterality: N/A;    OB History    No data available       Home Medications    Prior to Admission medications   Medication Sig Start Date End Date Taking? Authorizing Provider  aspirin 325 MG tablet Take 1 tablet (325 mg total) by mouth daily. 09/27/15   Catarina Hartshornavid Tat, MD  atorvastatin (LIPITOR) 80 MG tablet Take 1 tablet (80 mg total) by mouth daily at 6 PM. 09/27/15   Catarina Hartshornavid Tat, MD  citalopram (CELEXA) 20 MG tablet Take 20 mg by mouth daily.    Historical Provider, MD  clonazePAM (KLONOPIN) 0.5 MG tablet Take 0.5 mg by mouth daily as needed for anxiety.  06/24/15   Historical Provider, MD  esomeprazole (NEXIUM) 20 MG capsule Take 20 mg by mouth daily as needed (for heartburn or acid reflux).  06/24/15   Historical Provider, MD    Family History Family History  Problem Relation Age of Onset  . Adopted: Yes    Social History Social History  Substance Use Topics  . Smoking status: Never Smoker  . Smokeless tobacco: Never Used  . Alcohol use 1.2 oz/week    1 Standard drinks or equivalent,  1 Cans of beer per week     Comment: occasionally     Allergies   Benadryl [diphenhydramine hcl]; Imitrex [sumatriptan]; Iodine; Ivp dye [iodinated diagnostic agents]; Lobster [shellfish allergy]; Lamictal [lamotrigine]; Tetracyclines & related; and Theophyllines   Review of Systems Review of Systems  Neurological: Positive for headaches.  All other systems reviewed and are negative.    Physical Exam Updated Vital Signs BP 111/65   Pulse 70   Temp 98.5 F (36.9 C) (Oral)   Resp 14   Ht 5\' 4"  (1.626 m)   Wt 188 lb (85.3 kg)   SpO2 96%   BMI 32.27 kg/m   Physical Exam  Nursing note and vitals reviewed.  55 year old  female, resting comfortably and in no acute distress. Vital signs are normal. Oxygen saturation is 96%, which is normal. Head is normocephalic and atraumatic. PERRLA, EOMI. Oropharynx is clear. Neck is nontender and supple without adenopathy or JVD. There are no carotid bruits. Back is nontender and there is no CVA tenderness. Lungs are clear without rales, wheezes, or rhonchi. Chest is nontender. Heart has regular rate and rhythm without murmur. Abdomen is soft, flat, nontender without masses or hepatosplenomegaly and peristalsis is normoactive. Extremities have no cyanosis or edema, full range of motion is present. Skin is warm and dry without rash. Neurologic: She is awake, alert, oriented. Speech is spontaneous and fluent but she has some difficulty with naming. Cranial nerves are intact. There is mild right hemiparesis with strength 4/5.  ED Treatments / Results  Labs (all labs ordered are listed, but only abnormal results are displayed) Labs Reviewed  PROTIME-INR  CBG MONITORING, ED  I-STAT TROPOININ, ED  CBG MONITORING, ED  I-STAT CHEM 8, ED    EKG  EKG Interpretation  Date/Time:  Monday December 12 2015 17:51:34 EDT Ventricular Rate:  76 PR Interval:  130 QRS Duration: 76 QT Interval:  412 QTC Calculation: 463 R Axis:   -27 Text Interpretation:  Normal sinus rhythm Anterior infarct , age undetermined Abnormal ECG When compared with ECG of 10/28/2015, No significant change was found Confirmed by [facility]  MD, Zurri Rudden ([id number]) on 12/12/2015 6:53:34 PM       Radiology Mr Brain Wo Contrast  Result Date: 12/12/2015 CLINICAL DATA:  55 y/o F; history of stroke several months ago presenting with abnormal visual spatial distortion and cognitive slowing. Concern for acute stroke. EXAM: MRI HEAD WITHOUT CONTRAST TECHNIQUE: Multiplanar, multiecho pulse sequences of the brain and surrounding structures were obtained without intravenous contrast. COMPARISON:  12/12/2015 CT head.   10/29/2015 MRI brain. FINDINGS: Brain: No acute infarction, hemorrhage, hydrocephalus, extra-axial collection or mass lesion. There are persistent foci of DWI hyperintensity without low ADC in the region of prior infarct consistent with T2 shine through from prior infarction. Background of minimal chronic microvascular ischemic changes. No abnormal susceptibility hypointensity to indicate intracranial hemorrhage. Vascular: Normal flow voids. Skull and upper cervical spine: Normal marrow signal. Sinuses/Orbits: Diffuse paranasal sinus mucosal thickening with partial opacification of right anterior ethmoid air cells and right maxillary sinus mucous retention cyst. No abnormal signal of mastoid air cells. Orbits are unremarkable. Other: None. IMPRESSION: 1. No evidence of acute/early subacute infarct, focal mass effect, or intracranial hemorrhage. 2. Small now chronic right posterior frontal infarct. 3. Mild paranasal sinus disease improved from prior MR. Electronically Signed   By: Mitzi Hansen M.D.   On: 12/12/2015 19:53   Ct Head Code Stroke W/o Cm  Result Date: 12/12/2015 CLINICAL DATA:  Code  stroke. Expressive aphasia. History of stroke, diabetes, hyperlipidemia. EXAM: CT HEAD WITHOUT CONTRAST TECHNIQUE: Contiguous axial images were obtained from the base of the skull through the vertex without intravenous contrast. COMPARISON:  MRI of the brain October 29, 2015 and CT HEAD October 28, 2015 FINDINGS: BRAIN: The ventricles and sulci are normal. No intraparenchymal hemorrhage, mass effect nor midline shift. No acute large vascular territory infarcts. Patchy periventricular white hypodensities. No abnormal extra-axial fluid collections. Basal cisterns are patent. VASCULAR: Minimal calcific atherosclerosis the carotid siphon. SKULL/SOFT TISSUES: No skull fracture. No significant soft tissue swelling. ORBITS/SINUSES: The included ocular globes and orbital contents are normal.The mastoid aircells and included  paranasal sinuses are well-aerated. OTHER: None. ASPECTS [facility] Stroke Program Early CT Score) - Ganglionic level infarction (caudate, lentiform nuclei, internal capsule, insula, M1-M3 cortex): 7 - Supraganglionic infarction (M4-M6 cortex): 3 Total score (0-10 with 10 being normal): 10 IMPRESSION: 1. No acute intracranial process. Mild white matter changes most compatible with chronic small vessel ischemic disease, advanced for age. 2. ASPECTS is 10. Acute findings discussed with and reconfirmed by Dr.Kirkpatrick, Neurology on 12/12/2015 at 6:20 pm. Electronically Signed   By: Awilda Metro M.D.   On: 12/12/2015 18:25    Procedures Procedures (including critical care time)  Medications Ordered in ED Medications - No data to display   Initial Impression / Assessment and Plan / ED Course  I have reviewed the triage vital signs and the nursing notes.  Pertinent labs & imaging results that were available during my care of the patient were reviewed by me and considered in my medical decision making (see chart for details).  Clinical Course    Apparent new stroke involving left hemisphere. Code stroke is activated.However, presence of headache with photophobia does raise possibility of migraine with neurologic symptoms. Patient is seen in conjunction with Dr. Amada Jupiter of neurology service. Old records are reviewed confirming stroke in the right hemisphere in June of the subsequent readmission for extension of stroke. Also, admission for transient ischemic attack in July.  Initial valuation is unremarkable. She is sent for MRI which shows no evidence of acute stroke. Decision is made to treat for migraines to see if neurologic symptoms resolved. She is given a migraine cocktail of normal saline, metoclopramide, ketorolac. Diphenhydramine is not given because of allergy. She had a dystonic reaction to metoclopramide which is treated with benztropine with resolution. She had complete resolution of  her neurologic symptoms and headache was almost completely resolved. She is discharged with instructions to follow-up with her PCP and neurologist.  Final Clinical Impressions(s) / ED Diagnoses   Final diagnoses:  Migraine with aura and without status migrainosus, not intractable    New Prescriptions New Prescriptions   No medications on file     Dione Booze, MD 12/12/15 2142

## 2015-12-12 NOTE — Consult Note (Signed)
Neurology Consultation Reason for Consult: Code stroke Referring Physician: Preston FleetingGlick, D  CC: Cognitive slowing  History is obtained from: Patient  HPI: Jenna Delgado is a 55 y.o. female with a history of stroke several months ago who presents with abnormal visual spatial distortion as well as cognitive slowing that started around 2 PM. She states that she was working on a project, and couldn't quite make out what she was doing with it. Her husband then noticed that she was speaking so more slowly than she typically does. Because of this they came to the emergency room where a code stroke was activated. Due to being on the cusp of being outside the window as well as mild symptoms and recent stroke, TPA was not administered.   LKW: 2 PM tpa given?: no, mild symptoms, stroke within 90 days    ROS: A 14 point ROS was performed and is negative except as noted in the HPI.  Past Medical History:  Diagnosis Date  . Asthma   . Diabetes mellitus without complication (HCC)   . Mild bipolar disorder (HCC)   . Prediabetes   . Seasonal allergic rhinitis   . Stroke (HCC)   . Vitamin D deficiency      Family History  Problem Relation Age of Onset  . Adopted: Yes     Social History:  reports that she has never smoked. She has never used smokeless tobacco. She reports that she drinks about 1.2 oz of alcohol per week . She reports that she does not use drugs.   Exam: Current vital signs: BP 118/73 (BP Location: Right Arm)   Pulse 70   Temp 98.4 F (36.9 C) (Oral)   Resp 16   Ht 5\' 4"  (1.626 m)   Wt 85.3 kg (188 lb)   BMI 32.27 kg/m  Vital signs in last 24 hours: Temp:  [98.4 F (36.9 C)] 98.4 F (36.9 C) (09/11 1816) Pulse Rate:  [70] 70 (09/11 1816) Resp:  [16] 16 (09/11 1816) BP: (118)/(73) 118/73 (09/11 1816) Weight:  [85.3 kg (188 lb)] 85.3 kg (188 lb) (09/11 1756)   Physical Exam  Constitutional: Appears well-developed and well-nourished.  Psych: Affect appropriate to  situation Eyes: No scleral injection HENT: No OP obstrucion Head: Normocephalic.  Cardiovascular: Normal rate and regular rhythm.  Respiratory: Effort normal and breath sounds normal to anterior ascultation GI: Soft.  No distension. There is no tenderness.  Skin: WDI  Neuro: Mental Status: Patient is awake, alert, oriented to person, place, month, year, and situation. Patient is able to give a clear and coherent history. No signs of aphasia or neglect Cranial Nerves: II: Visual Fields are full. Pupils are equal, round, and reactive to light.   III,IV, VI: EOMI without ptosis or diploplia.  V: Facial sensation is symmetric to temperature VII: Facial movement is symmetric.  VIII: hearing is intact to voice X: Uvula elevates symmetrically XI: Shoulder shrug is symmetric. XII: tongue is midline without atrophy or fasciculations.  Motor: Tone is normal. Bulk is normal. She has possible very mild weakness on the right arm and leg, but this is not definite and mildly inconsistent. Sensory: Sensation is diminished in the right arm Cerebellar: FNF and HKS are intact bilaterally  I have reviewed labs in epic and the results pertinent to this consultation are: Chem 8 - unremarkable  I have reviewed the images obtained: CT head-no acute findings  Impression: 55 year old female with abnormal visual spatial distortion as well as cognitive slowing in the  setting of severe photophobic headache. Without her history, I would have a very high degree of suspicion for migraine headache, but with her history of stroke this does need to be ruled out.  If an MRI is negative, then I would pursue treating this as complicated migraine.  Recommendations: 1) MRI brain 2) if negative, would treat as complicated migraine   Ritta Slot, MD [facility] [phone]  If 7pm- 7am, please page neurology on call as listed in AMION.

## 2015-12-12 NOTE — ED Notes (Signed)
Pt and family member complaining on the nurse station that no body is been on her room to update the pt, hourly rounding is been given, pt updated multiple times on POC, Migraine cocktail given for pain control and pt had adverse reaction, medication to control adverse reaction given as ordered and pt assisted to bathroom and back to her room. See chart for rounding documentation.

## 2015-12-26 LAB — CUP PACEART REMOTE DEVICE CHECK: MDC IDC SESS DTM: 20170901170851

## 2015-12-27 ENCOUNTER — Ambulatory Visit (INDEPENDENT_AMBULATORY_CARE_PROVIDER_SITE_OTHER): Payer: BLUE CROSS/BLUE SHIELD | Admitting: Neurology

## 2015-12-27 DIAGNOSIS — G4733 Obstructive sleep apnea (adult) (pediatric): Secondary | ICD-10-CM | POA: Diagnosis not present

## 2015-12-27 DIAGNOSIS — G4720 Circadian rhythm sleep disorder, unspecified type: Secondary | ICD-10-CM

## 2015-12-27 DIAGNOSIS — G4761 Periodic limb movement disorder: Secondary | ICD-10-CM

## 2015-12-29 ENCOUNTER — Ambulatory Visit (INDEPENDENT_AMBULATORY_CARE_PROVIDER_SITE_OTHER): Payer: BLUE CROSS/BLUE SHIELD | Admitting: Psychology

## 2015-12-29 ENCOUNTER — Encounter: Payer: Self-pay | Admitting: Psychology

## 2015-12-29 DIAGNOSIS — F4323 Adjustment disorder with mixed anxiety and depressed mood: Secondary | ICD-10-CM

## 2015-12-29 DIAGNOSIS — I69319 Unspecified symptoms and signs involving cognitive functions following cerebral infarction: Secondary | ICD-10-CM

## 2015-12-29 DIAGNOSIS — I639 Cerebral infarction, unspecified: Secondary | ICD-10-CM

## 2015-12-29 DIAGNOSIS — R41844 Frontal lobe and executive function deficit: Secondary | ICD-10-CM | POA: Diagnosis not present

## 2015-12-29 NOTE — Progress Notes (Signed)
NEUROPSYCHOLOGICAL INTERVIEW (CPT: [id number])  Name: Jenna Delgado Date of Birth: April 19, 1960 Date of Interview: 12/29/2015  Reason for Referral:  Jenna Delgado is a 55 y.o., married female who is referred for neuropsychological evaluation by Dr. Juluis Rainier of [facility] due to concerns about post-CVA cognitive dysfunction. This patient is unaccompanied in the office for today's appointment.  History of Presenting Problem:  Jenna Delgado reported that she was in her usual state, but under a great deal of psychosocial stress, when she developed parasthesias on the left side of her body prior to going to bed in the early morning hours of 09/25/2015. She went to sleep but when she awoke, the symptoms were still there, so she went to the ED. MRI of the brain showed right posterior frontal cortical and subcortical infarction involving the hand motor area. She was discharged home on 09/27/2015. On 09/29/2015, she was at her doctor's office when her physician noticed that her speech became significantly slurred over the course of a few minutes. She was taken to the ED via ambulance. She reported increasing left sided weakness. Brain MRI revealed slightly increased in size and normal evolution of small right posterior frontal infarct with increased intensity of signal, but no hemorrhagic transformation or new stroke. On 10/28/2015, the patient experienced acute onset of speech difficulty (inability to articulate words with intact comprehension), and right-sided weakness/numbness in extremities. EMS was called and she was taken to the ED and admitted. Symptoms resolved relatively quickly, and a CT of the head revealed no acute abnormality. An MRI of the brain completed the following morning showed subacute right hemisphere nonhemorrhagic infarction and no acute left hemisphere event. She was diagnosed with TIA and discharged on 10/31/2015. On 12/12/2015, the patient was working on her art and noticed she was  having difficulty with spatial orientation. She also reported confusion. She presented to the ED. She did notice any weakness but did develop a right frontal headache with no associated nausea. MRI showed small now chronic right posterior frontal infarct with no evidence of an acute stroke. She was treated for a migraine with migraine cocktail and she reported subsequent complete resolution of neurologic symptoms, with headache almost completely resolved.   Jenna Delgado reported ongoing cognitive difficulties since her stroke in June. She reported transient "holes" in her memory, described as memory lapses for both recent and remote/pre-stroke events. She reports this memory loss "seems excessive for how little the stroke was". She also reported ongoing issues with visual-spatial perception when doing her art. For example, on one occasion, she needed to make a top for a box, and it was a very simple task, but she could not figure it out. Additionally, the patient reported more difficulty comprehending complex pieces of writing. In terms of speech output, she reported that she occasionally perceives that her speech is less fluid than it was before and now seems more "broken up" because she has to think about what she is expressing much more than she used to.   Upon direct questioning, the patient reported since stroke:  Forgetting recent conversations/events: Yes (and pre-stroke events even years ago) Repeating statements/questions: Unsure Misplacing/losing items: No Forgetting appointments or other obligations: Have had a couple of issues but able to use calendar fine, nothing significant (eg wrote down wrong name at hairdresser, so went through about two weeks of panicking of who the person was, then missed the appointment) - so now write more details  Forgetting to take medications: No  Difficulty  concentrating: Yes Starting but not finishing tasks: Yes Distracted easily: Yes Processing information  more slowly: Yes  Word-finding difficulty: Yes Word substitutions: No Writing difficulty: No Spelling difficulty: No Comprehension difficulty: Yes with reading (esp long/complex things)  Getting lost when driving: No Making wrong turns when driving: No Uncertain about directions when driving or passenger: No change MVA's: No  The patient is going to be starting QEEG biofeedback soon.   The patient denied any current problems with her left hand. She reported increased pain with atorvastatin and therefore discontinued it. She does feel her pain level has improved since stopping the medication, and she thinks her mental fogginess may have lifted somewhat as well. She does report significant fatigue and difficulty sleeping. She underwent a sleep study this week and is awaiting those results. She denied any difficulty with walking or balance. She has not had any falls.  With regard to mood and personality, the patient reported increased depression and frustration since her stroke. She denied suicidal ideation or intention. She is very frustrated by not knowing why she had the stroke. She does not feel she had risk factors for stroke. When asked about her history of diabetes, she stated that she does not have diabetes. She reported that she had gestational diabetes and at some point later her blood sugars were increasing and she was told she had pre-diabetes, so she made dietary changes and her sugars went down. She reported her A1C level is consistently in the normal range. She does admit that her weight could be a risk factor. She denied any history of hypertension. She is a former smoker but she quit many years ago so she does not consider that a risk factor.  Since the stroke and the transient post-stroke neurologic symptoms, she has naturally been thinking about and worrying about her health, and she is not used to having to worry about her health.  In terms of other pertinent medical history,  the patient reported a remote history of two concussions, both in the 1990s. On one occasion, she was getting her mail, and she slipped on ice and hit her head on the curb. She sustained brief LOC (exact duration unknown as she was alone). She did not sustain any injuries and did not seek medical treatment. She denied any persisting difficulties. The second one occurred when she fell out of a trailer (she thought the stairs has been put down but they weren't). She hit her head on her husband's car which was next to the trailer. She did report +LOC (again exact duration unknown as she was alone at the time). She reported significant confusion and altered mental status afterwards. She did not connect that the concussion may be contributing to her symptoms so she actually sought inpatient psychiatric hospitalization. It was not until she was at the hospital that she realized she had a scalp laceration with dried blood in her hair. A CT of the head was apparently normal and she was sent home. She had a bad headache for a while but she returned to her cognitive baseline within a few days.  The patient denied history of illicit substance use other than occasional marijuana use which she uses to help her sleep. She denied any history of substance dependence.   Family neurologic history is unknown as the patient is adopted.   Current Functioning: The patient continues to manage all instrumental ADLs including driving, medications, appointments, finances and cooking without any significant difficulty. 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   Psychiatric History: Mrs. Achterberg carries a diagnosis of bipolar disorder but it is unclear if she truly meets diagnostic criteria. She reported a history of "one manic episode" almost 30 years ago which she says was  actually due to a medication (benadryl). She denied any manic episodes since then. She did report a history of depressive episodes (infrequent and tend to dissipate relatively quickly), which alternate with periods of time when she is possibly hypomanic at most, with "more energy than a lot of people", perhaps some mild impulsivity (less so now but did have this in her earlier years) and insomnia. She wonders if she has cyclothymia. The patient also has a history of mild anxiety. She was seeing a psychiatrist in the past who was managing her medications, but the psychiatrist left the practice without any notice, so she started seeing Dr. Evelene Croon. She reported significantly increased anxiety and depression related to psychosocial stressors in the 1-2 weeks prior to her stroke in June. In fact, she started seeing a counselor two days prior to her stroke. She reported a very stressful visit to her son and grandson (her son has bipolar disorder and her grandson has serious mental illness) in [place], followed immediately by a trip to [place] to care for her parents. She also reported new health problems with her husband which are causing increased stress. She continues to see Dr. Evelene Croon and her counselor.   She denied any history of suicide attempts. She has not had any suicidal ideation in recent decades. She denied history of self-harm behaviors. She denied history of psychosis other than one occasion of possible visual hallucinations many years ago when she was being treated with continual steroids for asthma. It is unclear if these were truly hallucinations; she reported she would "look at the back door and not be able to figure out why it was there". These resolved after she stopped the steroids and never recurred.   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 Marital history: Married x over 30 years. Married once before x1 year. Children: Three children (2 boys and a girl). First son was from first marriage - he is the one with bipolar disorder (his father's mother had bipolar disorder, too). No mental health issues with other 2 children. Two grandchildren.  Alcohol/Tobacco/Substances: Occasional, minimal alcohol. Smoked cigarettes many years ago, at least 25 years ago. Occasional marijuana use. No other substance use.   Medical History: Past Medical History:  Diagnosis Date  . Asthma   . Diabetes mellitus without complication (HCC)   . Mild bipolar disorder (HCC)   . Prediabetes   . Seasonal allergic rhinitis   . Stroke (HCC)   . Vitamin D deficiency    The patient reported that she has never been diagnosed with diabetes.   Current Medications:  Outpatient Encounter Prescriptions as of 12/29/2015  Medication Sig  . aspirin 325 MG tablet Take 1 tablet (325 mg total) by mouth daily.  [name] atorvastatin (LIPITOR) 80 MG tablet Take 1 tablet (80 mg total) by mouth daily at 6 PM.  . citalopram (CELEXA) 20 MG tablet Take 20 mg by mouth daily.  . clonazePAM (KLONOPIN) 0.5  MG tablet Take 0.5 mg by mouth daily as needed for anxiety.   [name]. esomeprazole (NEXIUM) 20 MG capsule Take 20 mg by mouth daily as needed (for heartburn or acid reflux).    No facility-administered encounter medications on file as of 12/29/2015.     Behavioral Observations:   Appearance: Appropriately dressed and groomed. Gait: Ambulated independently, no abnormalities observed. Speech: Fluent; normal rate, rhythm and volume Thought process: Generally linear, goal directed Affect: Full, appropriate, stable Interpersonal: Pleasant, appropriate   TESTING: There is medical necessity to proceed with neuropsychological assessment as the results will be used to aid in differential diagnosis and clinical decision-making and to inform specific treatment recommendations. The  patient has a known history of stroke in 09/2015, and according to the patient and medical records reviewed, there has been a change in cognitive functioning and a reasonable suspicion of persisting cognitive deficits.   PLAN: The patient will return for a full battery of neuropsychological testing with a psychometrician under my supervision. Education regarding testing procedures was provided. Subsequently, the patient will see this provider for a follow-up session at which time her test performances and my impressions and treatment recommendations will be reviewed in detail.   Full neuropsychological evaluation report to follow.

## 2016-01-02 ENCOUNTER — Ambulatory Visit: Payer: BLUE CROSS/BLUE SHIELD | Admitting: Neurology

## 2016-01-02 ENCOUNTER — Ambulatory Visit (INDEPENDENT_AMBULATORY_CARE_PROVIDER_SITE_OTHER): Payer: BLUE CROSS/BLUE SHIELD | Admitting: *Deleted

## 2016-01-02 DIAGNOSIS — I639 Cerebral infarction, unspecified: Secondary | ICD-10-CM

## 2016-01-02 NOTE — Progress Notes (Signed)
Carelink Summary Report / Loop Recorder 

## 2016-01-03 ENCOUNTER — Ambulatory Visit (INDEPENDENT_AMBULATORY_CARE_PROVIDER_SITE_OTHER): Payer: BLUE CROSS/BLUE SHIELD | Admitting: Psychology

## 2016-01-03 DIAGNOSIS — R41844 Frontal lobe and executive function deficit: Secondary | ICD-10-CM

## 2016-01-03 DIAGNOSIS — I69319 Unspecified symptoms and signs involving cognitive functions following cerebral infarction: Secondary | ICD-10-CM

## 2016-01-03 NOTE — Progress Notes (Signed)
   Neuropsychology Note  Jenna Delgado Pianka returned today for 3 hours of neuropsychological testing with technician, Wallace Kellerana Hason Ofarrell, BS, under the supervision of Dr. Elvis CoilMaryBeth Bailar. The patient did not appear overtly distressed by the testing session, per behavioral observation or via self-report to the technician. Rest breaks were offered. Jenna Delgado Quebedeaux will return within 2 weeks for a feedback session with Dr. Alinda DoomsBailar at which time her test performances, clinical impressions and treatment recommendations will be reviewed in detail. The patient understands she can contact our office should she require our assistance before this time.  Full report to follow.

## 2016-01-06 ENCOUNTER — Telehealth: Payer: Self-pay | Admitting: Neurology

## 2016-01-06 NOTE — Telephone Encounter (Signed)
Patient referred by Dr. Pearlean BrownieSethi, seen by me on 12/01/15, diagnostic PSG on 12/27/15.   Please call and notify the patient that the recent sleep study did not show any significant obstructive sleep apnea, but she had significant leg twitching. Please inform patient that I would like to go over the details of the study during a follow up appointment. Arrange a followup appointment. Also, route or fax report to PCP and referring MD, if other than PCP.  Once you have spoken to patient, you can close this encounter.   Thanks,  Huston FoleySaima Dina Mobley, MD, PhD [facility]

## 2016-01-06 NOTE — Progress Notes (Signed)
PATIENT'S NAME:  Jenna Delgado, Jenna Delgado DOB:      01-29-1961      MR#:    [id number]     DATE OF RECORDING: 12/27/2015 REFERRING M.D.:  Delia Heady, MD, PCP: Juluis Rainier, MD Study Performed:   Baseline Polysomnogram HISTORY: 55 year old right-handed woman with an underlying medical history of diabetes, asthma, allergic rhinitis, vitamin D deficiency, mood disorder, and stroke in June 2017 and TIA in July 2017, as well as obesity, who reports snoring and excessive daytime somnolence. She has trouble initiating and maintaining sleep, she reports restless leg symptoms and has had these for years. She also moves her legs and her sleep and this disturbs her husband sometimes including her snoring.   The patient endorsed the Epworth Sleepiness Scale at 10/24 points.    The patient's weight 188 pounds with a height of 64 (inches), resulting in a BMI of 32. kg/m2.  The patient's neck circumference measured 14.5 inches.  CURRENT MEDICATIONS: Aspirin, Lipitor, Celexa, Klonopin, Nexium.   PROCEDURE:  This is a multichannel digital polysomnogram utilizing the Somnostar 11.2 system.  Electrodes and sensors were applied and monitored per AASM Specifications.   EEG, EOG, Chin and Limb EMG, were sampled at 200 Hz.  ECG, Snore and Nasal Pressure, Thermal Airflow, Respiratory Effort, CPAP Flow and Pressure, Oximetry was sampled at 50 Hz. Digital video and audio were recorded.      BASELINE STUDY  Lights Out was at 21:25 and Lights On at 05:23.  Total recording time (TRT) was 478.5, with a total sleep time (TST) of 286.5 minutes.   The patient's sleep latency was 212 minutes, which was markedly prolonged.  REM latency was 223.5 minutes, also markedly prolonged.  The sleep efficiency was 59.9 %, which was reduced. There was an increased percentage of light stage sleep and a decreased percentage of REM sleep.     SLEEP ARCHITECTURE: Wake after sleep onset was 52.5 minutes, with moderate sleep fragmentation noted,  Stage N1 12.%, Stage N2 65.6%, Stage N3 12.9% and Stage R (REM sleep) 9.4%.   RESPIRATORY ANALYSIS:  There was a total of 18 respiratory events:  18 obstructive apneas, 0 central apneas and 0 mixed apneas with a total of 18 apneas and an apnea index (AI) of 3.8. There were 0 hypopneas with a hypopnea index of 0. The patient also had 0 respiratory event related arousals (RERAs).      The total APNEA/HYPOPNEA INDEX (AHI) was 3.8 and the total RESPIRATORY DISTURBANCE INDEX was 3.8.  12 events occurred in REM sleep and 0 events in NREM. The REM AHI was 26.7, versus a non-REM AHI of 1.4. The patient spent 40% of total sleep time in the supine position. The supine AHI was 7.9 versus a non-supine AHI of 1.0. Mild to moderate snoring was noted.   OXYGEN SATURATION & C02:  The baseline 02 saturation was 96%, with the lowest being 88%. Time spent below 89% saturation equaled 1 minutes.   PERIODIC LIMB MOVEMENTS:   The patient had a total of 472 Periodic Limb Movements.  The Periodic Limb Movement (PLM) index was 98.8 and the PLM Arousal index was 18.8.   IMPRESSION:  1.  Obstructive Sleep Apnea, positional and stage-related  2.  Periodic Limb Movement Disorder  3.  Dysfunctions associated with sleep stages or arousal from sleep.   RECOMMENDATIONS:  1.  This study demonstrates an overall normal AHI, but there is evidence of moderate REM related OSA, mild during supine sleep. For this, CPAP  therapy is not warranted; weight loss and avoidance of the supine sleep position will likely help reduce her sleep disordered breathing.    2.  This study shows reduced sleep efficiency and abnormal sleep stage percentages; this is a non-specific finding and may be due to sleeping in laboratory environment, medications, and/or and underlying medical or mood disorder.   3.  Severe PLMs were noted with moderate arousals; clinical correlation is recommended. Medication effect from her antidepressant medication should be  considered.    4. A follow up appointment [facility]. Her referring provider and PCP will be notified of the results.       I certify that I have reviewed the entire raw data recording prior to the issuance of this report in accordance with the Standards of Accreditation of the American Academy of Sleep Medicine (AASM)       Huston FoleySaima Arlington Sigmund, MD, PhD Diplomat, American Board of Psychiatry and Neurology  Diplomat, American Board of Sleep Medicine

## 2016-01-09 NOTE — Telephone Encounter (Signed)
LM for patient to call back and make f/u appt with Dr. Frances FurbishAthar. Also can speak with me to get results.

## 2016-01-16 NOTE — Progress Notes (Signed)
NEUROPSYCHOLOGICAL EVALUATION   Name:    Jenna Delgado  Date of Birth:   10-27-1960 Date of Interview:  12/29/2015 Date of Testing:  01/03/2016   Date of Feedback:  01/17/2016      Background Information:  Reason for Referral:  Jenna Delgado is a 55 y.o., ambidextrous, married female referred by Dr. 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 The current evaluation consisted of a review of available medical records, an interview with the patient, and the completion of a neuropsychological testing battery. Informed consent was obtained.  History of Presenting Problem:  Jenna Delgado reported that she was in her usual physical state, but under a great deal of psychosocial stress, when she developed parasthesias on the left side of her body prior to going to bed in the early morning hours of 09/25/2015. She went to sleep but when she awoke, the symptoms were still there, so she went to the ED. MRI of the brain showed right posterior frontal cortical and subcortical infarction involving the hand motor area. She was discharged home on 09/27/2015. On 09/29/2015, she was at her doctor's office when her physician noticed that her speech became significantly slurred over the course of a few minutes. She was taken to the ED via ambulance. She reported increasing left sided weakness. Brain MRI revealed slightly increased in size and normal evolution of small right posterior frontal infarct with increased intensity of signal, but no hemorrhagic transformation or new stroke. On 10/28/2015, the patient experienced acute onset of speech difficulty (inability to articulate words with intact comprehension), and right-sided weakness/numbness in extremities. EMS was called and she was taken to the ED and admitted. Symptoms resolved relatively quickly, and a CT of the  head revealed no acute abnormality. An MRI of the brain completed the following morning showed now subacute right hemisphere nonhemorrhagic infarction and no acute left hemisphere event. She was diagnosed with TIA and discharged on 10/31/2015. On 12/12/2015, the patient was working on her art and noticed she was having difficulty with spatial orientation. She also reported confusion. She presented to the ED. She did not notice any weakness but did develop a right frontal headache with no associated nausea. MRI showed small now chronic right posterior frontal infarct with no evidence of an acute stroke. She was treated for a migraine with migraine cocktail and she reported subsequent complete resolution of neurologic symptoms, with headache almost completely resolved.   Jenna Delgado reported ongoing cognitive difficulties since her stroke in June. She reported transient "holes" in her memory, described as memory lapses for both recent and remote/pre-stroke events. She reports this memory loss "seems excessive for how little the stroke was". She also reported ongoing issues with visual-spatial perception when doing her art. For example, on one occasion, she needed to make a top for a box, and it was a very simple task, but she could not figure it out. Additionally, the patient reported more difficulty comprehending complex pieces of writing. In terms of speech output, she reported that she occasionally perceives that her speech is less fluid than it was before and now seems more "broken up" because she has to think about what she is expressing much more than she used to.   Upon direct questioning, the patient reported since stroke:  Forgetting recent conversations/events: Yes (and pre-stroke events even years ago) Repeating statements/questions: Unsure Misplacing/losing items:  No Forgetting appointments or other obligations: Have had a couple of issues but able to use calendar fine, nothing significant (eg  wrote down wrong name at hairdresser, so went through about two weeks of panicking of who the person was, then missed the appointment) - so now write more details  Forgetting to take medications: No  Difficulty concentrating: Yes Starting but not finishing tasks: Yes Distracted easily: Yes Processing information more slowly: Yes  Word-finding difficulty: Yes Word substitutions: No Writing difficulty: No Spelling difficulty: No Comprehension difficulty: Yes with reading (esp long/complex things)  Getting lost when driving: No Making wrong turns when driving: No Uncertain about directions when driving or passenger: No change MVA's: No  The patient is going to be starting QEEG biofeedback soon.   The patient denied any current problems with her left hand. She reported increased pain with atorvastatin and therefore discontinued it. She does feel her pain level has improved since stopping the medication, and she thinks her mental fogginess may have lifted somewhat as well. She does report significant fatigue and difficulty sleeping. She underwent a sleep study which suggested that she does not require a CPAP. She denied any difficulty with walking or balance. She has not had any falls.  With regard to mood and personality, the patient reported increased depression and frustration since her stroke. She denied suicidal ideation or intention. She is very frustrated by not knowing why she had the stroke. She does not feel she had risk factors for stroke. When asked about her history of diabetes, she stated that she does not have diabetes. She reported that she had gestational diabetes and at some point later her blood sugars were increasing and she was told she had pre-diabetes, so she made dietary changes and her sugars went down. She reported her A1C level is consistently in the normal range. She does admit that her weight could be a risk factor. She denied any history of hypertension. She is a  former smoker but she quit many years ago so she does not consider that a risk factor.  Since the stroke and the transient post-stroke neurologic symptoms, she has naturally been thinking about and worrying about her health, and she is not used to having to worry about her health.  In terms of other pertinent medical history, the patient reported a remote history of two concussions, both in the 1990s. On one occasion, she was getting her mail, and she slipped on ice and hit her head on the curb. She sustained brief LOC (exact duration unknown as she was alone). She did not sustain any injuries and did not seek medical treatment. She denied any persisting difficulties. The second one occurred when she fell out of a trailer (she thought the stairs has been put down but they weren't). She hit her head on her husband's car which was next to the trailer. She did report +LOC (again exact duration unknown as she was alone at the time). She reported significant confusion and altered mental status afterwards. She did not connect that the concussion may be contributing to her symptoms so she actually sought inpatient psychiatric hospitalization. It was not until she was at the hospital that she realized she had a scalp laceration with dried blood in her hair. A CT of the head was apparently normal and she was sent home. She had a bad headache for a while but she returned to her cognitive baseline within a few days.  The patient denied history of illicit substance  use other than occasional marijuana use which she uses to help her sleep. She denied any history of substance dependence.   Family neurologic history is unknown as the patient is adopted.   Current Functioning: The patient continues to manage all instrumental ADLs including driving, medications, appointments, finances and cooking without any significant difficulty. 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   Psychiatric History: Mrs. Harkless carries a diagnosis of bipolar disorder but it is unclear if she truly meets diagnostic criteria. She reported a history of "one manic episode" almost 30 years ago which she says was actually due to a medication (benadryl). She denied any manic episodes since then. She did report a history of depressive episodes (infrequent and tend to dissipate relatively quickly), which alternate with periods of time when she is possibly hypomanic at most, with "more energy than a lot of people", perhaps some mild impulsivity (less so now but did have this in her earlier years) and insomnia. She wonders if she has cyclothymia. The patient also has a history of mild anxiety. She was seeing a psychiatrist in the past who was managing her medications, but the psychiatrist left the practice without any notice, so she started seeing Dr. Evelene Croon. She reported significantly increased anxiety and depression related to psychosocial stressors in the 1-2 weeks prior to her stroke in June. In fact, she started seeing a counselor two days prior to her stroke. She reported a very stressful visit to her son and grandson (her son has bipolar disorder and her grandson has serious mental illness) in [place], followed immediately by a trip to [place] to care for her parents. She also reported new health problems with her husband which are causing increased stress. She continues to see Dr. Evelene Croon and her counselor.   She denied any history of suicide attempts. She has not had any suicidal ideation in recent decades. She denied history of self-harm behaviors. She denied history of psychosis other than one occasion of possible visual hallucinations many years ago when she was being treated with continual steroids for asthma. It is unclear if these were truly hallucinations; she reported she would "look at the  back door and not be able to figure out why it was there". These resolved after she stopped the steroids and never recurred.   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 Marital history: Married x over 30 years. Married once before x1 year. Children: Three children (2 boys and a girl). First son was from first marriage - he is the one with bipolar disorder (his father's mother had bipolar disorder, too). No mental health issues with other 2 children. Two grandchildren.  Alcohol/Tobacco/Substances: Occasional, minimal alcohol. Smoked cigarettes many years ago, at least 25 years ago. Occasional marijuana use. No other substance use.    Medical History:  Past Medical History:  Diagnosis Date  . Asthma   . Diabetes mellitus without complication (HCC)   . Mild bipolar disorder (HCC)   . Prediabetes   . Seasonal allergic rhinitis   . Stroke (HCC)   . Vitamin D deficiency    The patient reported that she has never been diagnosed with diabetes.  Current medications:  Outpatient Encounter Prescriptions as of 01/17/2016  Medication Sig  . aspirin 325 MG tablet Take 1 tablet (325 mg total) by mouth daily.  [name]. atorvastatin (LIPITOR) 80 MG tablet Take 1 tablet (80 mg total) by mouth daily at 6 PM.  . citalopram (CELEXA) 20 MG tablet Take 20 mg by mouth daily.  . clonazePAM (KLONOPIN) 0.5 MG tablet Take 0.5 mg by mouth daily as needed for anxiety.   [name]. esomeprazole (NEXIUM) 20 MG capsule Take 20 mg by mouth daily as needed (for heartburn or acid reflux).    No facility-administered encounter medications on file as of 01/17/2016.    Patient reported that she is NOT taking atorvastatin.    Current Examination:  Behavioral Observations:  Appearance: Appropriately dressed and groomed. Gait: Ambulated independently, no abnormalities  observed. Speech: Fluent; normal rate, rhythm and volume Thought process: Generally linear, goal directed Affect: Full, appropriate, stable Interpersonal: Pleasant, appropriate Orientation: Oriented to all spheres. Accurately named the current President and his predecessor.  Tests Administered: . Test of Premorbid Functioning (TOPF) . Wechsler Adult Intelligence Scale-Fourth Edition (WAIS-IV): Similarities, Block Design, Matrix Reasoning, Arithmetic, Symbol Search, Coding and Digit Span subtests . Wechsler Memory Scale-Fourth Edition (WMS-IV) Adult Version (ages 6516-69): Logical Memory I, II and Recognition subtests  . [place] Verbal Learning Test - 2nd Edition (CVLT-2) Standard Form . Rey Complex Figure Test (RCFT) . [employer] Card Sorting Test (WCST) . Controlled Oral Word Association Test (COWAT) . Trail Making Test A and B . Neuropsychological Assessment Battery (NAB) Language Module, Form 1: Auditory Comprehension and Naming Subtests . Clock Drawing . Beck Depression Inventory - Second edition (BDI-II) . Personality Assessment Inventory (PAI)  Test Results: Note: Standardized scores are presented only for use by appropriately trained professionals and to allow for any future test-retest comparison. These scores should not be interpreted without consideration of all the information that is contained in the rest of the report. The most recent standardization samples from the test publisher or other sources were used whenever possible to derive standard scores; scores were corrected for age, gender, ethnicity and education when available.   Test Scores:  Test Name Standardized Score Descriptor  TOPF SS= 115 High average  WAIS-IV Subtests    Similarities ss= 10 Average  Block Design ss= 15 Superior  Matrix Reasoning ss= 14 Superior  Arithmetic ss= 14 Superior  Symbol Search ss= 10 Average  Coding ss= 10 Average  Digit Span ss= 16 Very superior  WMS-IV Subtests    LM I ss= 11  Average  LM II ss= 10 Average  LM II Recognition Cum %: >75 Above average  CVLT-II Scores    Trial 1 Z= -1 Low average  Trial 5 Z= -0.5 Average  Trials 1-5 total T= 37 Low average  SD Free Recall Z= 0 Average  SD Cued Recall Z= -1 Low average  LD Free Recall Z= -1.5 Borderline  LD Cued Recall Z= -0.5 Average  Recognition Discriminability (14/16 hits, 0 false positives) Z= 0.5 Average  Forced Choice Recognition Raw=16/16 WNL  RCFT    Copy Raw= 34/36; >16%ile WNL  3' Recall T= 52 Average  30' Recall T= 48 Average  Recognition T= 53 Average  NAB Language Subtests    Auditory Comprehension T= 55 Average  Naming T= 25 Impaired  COWAT-FAS T= 48 Average  COWAT-Animals T= 54 Average  WCST    Total Errors T= 49 Average  Perseverative Responses T= 39 Low average  Perseverative Errors T= 40 Low average  Conceptual  Level Responses T= 44 Average  Categories Completed 4; >16%ile WNL  Trials to Complete 1st Category 11;>16%ile WNL  Failure to Maintain Set 0 WNL  Trail Making Test A 0 errors T= 59 High average  Trail Making Test B 0 errors T= 65 Superior  Clock Drawing  WNL  BDI-II 18/63 Mild  PAI No elevated clinical scales      Description of Test Results:  Premorbid verbal intellectual abilities were estimated to have been within the high average range based on a test of word reading. Psychomotor processing speed was average. Auditory attention and working memory were superior to very superior. Visual-spatial construction was superior. Language abilities were variable. Specifically, confrontation naming was impaired, while semantic verbal fluency was average. Auditory comprehension was average. With regard to verbal memory, encoding and acquisition of non-contextual information (i.e., word list) was low average across five learning trials. After an interference task, free recall was average. She did not benefit from semantic cueing. After a delay, free recall was borderline impaired.  However, this time, she did benefit from semantic cueing. Cued recall was average. Performance on a yes/no recognition task was average. On another verbal memory test, encoding and acquisition of contextual auditory information (i.e., short stories) was average. After a delay, free recall was average. Performance on a yes/no recognition task was above average. With regard to non-verbal memory, delayed free recall of visual information was average both after a 3 minute delay and a 30 minute delay. Performance on a yes/no recognition task also was average. Executive functioning was intact. Mental flexibility and set-shifting were superior on Trails B. Verbal fluency with phonemic search restrictions was average. Verbal abstract reasoning was average. Non-verbal abstract reasoning was superior. Deductive reasoning and problem solving was intact. Performance on a clock drawing task was intact.   On a self-report questionnaire of mood (BDI-II), the patient's responses were indicative of mild but clinically significant depression at the present time. Symptoms endorsed included: mild sadness, pessimism, guilty feelings, loss of self-confidence, self-criticalness, tearfulness, restlessness, loss of interest, indecisiveness, loss of energy, fatigue. She reported more moderate symptoms of: feelings of failure, worthlessness, and concentration difficulty. She endorsed passive suicidal ideation but denied intention or plan. On a more extensive measure of psychopathology and personality functioning (PAI), the patient reported prominent cognitive concerns, mild depression, concerns about her health, and increased anxiety. She described her thought processes as marked by confusion, distractibility, and difficulty concentrating. She reported that she experiences a great deal of tension, has difficulty in relaxing, and likely encounters fatigue as a result of high perceived stress. She reported being particularly preoccupied with  her health status and physical problems, which is not surprising given her recent stroke.  According to the patient's self-report, she describes NO significant problems in the following areas: antisocial behavior; problems with empathy; undue suspiciousness or hostility; extreme moodiness and impulsivity; unusually elevated mood or heightened activity; problematic behaviors used to manage anxiety.  Also, she reports NO significant problems with alcohol or drug abuse or dependence.  With respect to suicidal ideation, the patient is NOT reporting distress from thoughts of self-harm.   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 Results of cognitive testing were largely within normal limits and commensurate with her estimated above-average baseline intellectual abilities. However, there was evidence of a focal deficit in confrontation naming, as well as indications of mild verbal memory deficits characterized by reduced  encoding/retrieval of non-contextual information (suggestive of memory dysfunction at the level of frontal-subcortical networks).  The patient's focal language deficit (in confrontation naming) is unusual for most people with right hemisphere infarct, but it must be noted that she is ambidextrous (likely originally left hand dominant but trained to write with right hand as a child) and therefore may have atypical cerebral lateralization. As such, I suspect that her language deficit is a result of her posterior frontal CVA. Mild dysfunction in frontal-subcortical connections affecting verbal memory are also likely a result of her CVA.  The patient has a history of major depressive episodes and while she has been diagnosed with "mild bipolar disorder" or bipolar II disorder in the past, it seems unclear to me whether she has ever actually experienced a hypomanic  or manic episode. She is currently experiencing increased depression and anxiety, likely as a result of both the emotional adjustment to her stroke and the neuropathology of the stroke.  In her daily life, I believe it is a combination of the actual insult to the brain, her longstanding depression, adjustment related anxiety, and increased psychosocial stress that is contributing to her subjective cognitive complaints and changes in functioning.   Recommendations/Plan: Based on the findings of the present evaluation, the following recommendations are offered:  1. Continue mental health treatment for depression and anxiety. Regular sessions with her counselor and regular follow-up with her psychiatrist was encouraged. 2. Consider attending CVA support group. 3. I believe this patient is an excellent candidate for speech/cognitive rehabilitation through neuro-rehab (to address both word finding deficits and other cognitive complaints). She is amenable to this recommendation. Her husband believes Dr. Pearlean Brownie recommended this is in the past. I will see if Dr. Pearlean Brownie can put in the referral for her.   Feedback to Patient: NIXON SPARR and her husband returned for a feedback appointment on 01/17/2016 to review the results of her neuropsychological evaluation with this provider. 45 minutes face-to-face time was spent reviewing her test results, my impressions and my recommendations as detailed above.    Total time spent on this patient's case: [id number] unit for interview with psychologist; [phone] units of testing by psychometrician under psychologist's supervision; [phone] units for medical record review, scoring of neuropsychological tests, interpretation of test results, preparation of this report, and review of results to the patient by psychologist.      Thank you for your referral of CHERRILL SCRIMA. Please feel free to contact me if you have any questions or concerns regarding this  report.

## 2016-01-17 ENCOUNTER — Encounter: Payer: Self-pay | Admitting: Psychology

## 2016-01-17 ENCOUNTER — Ambulatory Visit (INDEPENDENT_AMBULATORY_CARE_PROVIDER_SITE_OTHER): Payer: BLUE CROSS/BLUE SHIELD | Admitting: Psychology

## 2016-01-17 DIAGNOSIS — F331 Major depressive disorder, recurrent, moderate: Secondary | ICD-10-CM

## 2016-01-17 DIAGNOSIS — I69319 Unspecified symptoms and signs involving cognitive functions following cerebral infarction: Secondary | ICD-10-CM

## 2016-01-17 DIAGNOSIS — R41844 Frontal lobe and executive function deficit: Secondary | ICD-10-CM

## 2016-01-17 DIAGNOSIS — I639 Cerebral infarction, unspecified: Secondary | ICD-10-CM

## 2016-01-17 DIAGNOSIS — F4322 Adjustment disorder with anxiety: Secondary | ICD-10-CM

## 2016-01-17 NOTE — Patient Instructions (Signed)
Neurocognitive testing 01/2016 Strength (Superior to very superior) Normal Impairment  Scientist, clinical (histocompatibility and immunogenetics)Visual-spatial construction Psychomotor processing speed Confrontation naming  Non-verbal abstract reasoning Verbal abstract reasoning   Focused attention/working memory Verbal and nonverbal memory (especially when information is repeated and when you receive cues to recall it)   Mental flexibility and set-shifting  Deductive reasoning/problem-solving    Verbal fluency - letter fluency and category fluency    Auditory comprehension     Overall, your cognitive functioning is quite strong in the testing environment. You did demonstrate significant difficulty with one aspect of language (coming up with the name for items you see a picture of). This may be related to the stroke, although this typically occurs with left-hemisphere strokes (and yours was right-hemisphere).  In everyday life, you might notice cognitive difficulties that seem more significant than what the testing shows. This could be due to increased stress and depression.  Recommendations: 1. Neurology follow-up 2. Speech therapy at neuro-rehab (an MD will have to put in the referral) - this can be for both word-finding difficulty and general cognitive functioning 3. Continue working with your therapist and psychiatrist. Post-stroke depression is common, so working with both of these providers regularly is key. You might also consider attending a stroke support group.

## 2016-01-19 NOTE — Telephone Encounter (Signed)
LM on cell with results below and asked the patient to call back and make appt with Dr. Frances FurbishAthar.

## 2016-01-30 ENCOUNTER — Ambulatory Visit: Payer: BLUE CROSS/BLUE SHIELD | Admitting: Neurology

## 2016-01-31 ENCOUNTER — Ambulatory Visit (INDEPENDENT_AMBULATORY_CARE_PROVIDER_SITE_OTHER): Payer: BLUE CROSS/BLUE SHIELD | Admitting: *Deleted

## 2016-01-31 DIAGNOSIS — I639 Cerebral infarction, unspecified: Secondary | ICD-10-CM | POA: Diagnosis not present

## 2016-01-31 DIAGNOSIS — F411 Generalized anxiety disorder: Secondary | ICD-10-CM | POA: Diagnosis not present

## 2016-02-01 NOTE — Progress Notes (Signed)
Carelink Summary Report / Loop Recorder 

## 2016-02-03 NOTE — Telephone Encounter (Signed)
Patient returned Diana's call, please call [phone].

## 2016-02-06 DIAGNOSIS — F4310 Post-traumatic stress disorder, unspecified: Secondary | ICD-10-CM | POA: Diagnosis not present

## 2016-02-06 NOTE — Telephone Encounter (Signed)
I called patient. She asked to make f/u with Dr. Frances FurbishAthar. I scheduled appt.

## 2016-02-07 ENCOUNTER — Emergency Department (HOSPITAL_COMMUNITY): Payer: BLUE CROSS/BLUE SHIELD

## 2016-02-07 ENCOUNTER — Encounter (HOSPITAL_COMMUNITY): Payer: Self-pay

## 2016-02-07 ENCOUNTER — Emergency Department (HOSPITAL_COMMUNITY)
Admission: EM | Admit: 2016-02-07 | Discharge: 2016-02-07 | Disposition: A | Discharge status: Home / Self Care | Payer: BLUE CROSS/BLUE SHIELD | Attending: Emergency Medicine | Admitting: Emergency Medicine

## 2016-02-07 DIAGNOSIS — R200 Anesthesia of skin: Secondary | ICD-10-CM | POA: Diagnosis not present

## 2016-02-07 DIAGNOSIS — R2990 Unspecified symptoms and signs involving the nervous system: Secondary | ICD-10-CM

## 2016-02-07 DIAGNOSIS — R531 Weakness: Secondary | ICD-10-CM

## 2016-02-07 DIAGNOSIS — Z87891 Personal history of nicotine dependence: Secondary | ICD-10-CM | POA: Diagnosis not present

## 2016-02-07 DIAGNOSIS — J45909 Unspecified asthma, uncomplicated: Secondary | ICD-10-CM | POA: Diagnosis not present

## 2016-02-07 DIAGNOSIS — I6789 Other cerebrovascular disease: Secondary | ICD-10-CM | POA: Diagnosis not present

## 2016-02-07 DIAGNOSIS — E119 Type 2 diabetes mellitus without complications: Secondary | ICD-10-CM | POA: Insufficient documentation

## 2016-02-07 DIAGNOSIS — Z5181 Encounter for therapeutic drug level monitoring: Secondary | ICD-10-CM | POA: Diagnosis not present

## 2016-02-07 DIAGNOSIS — G43009 Migraine without aura, not intractable, without status migrainosus: Secondary | ICD-10-CM | POA: Insufficient documentation

## 2016-02-07 DIAGNOSIS — Z7982 Long term (current) use of aspirin: Secondary | ICD-10-CM | POA: Diagnosis not present

## 2016-02-07 DIAGNOSIS — Z79899 Other long term (current) drug therapy: Secondary | ICD-10-CM | POA: Insufficient documentation

## 2016-02-07 DIAGNOSIS — F411 Generalized anxiety disorder: Secondary | ICD-10-CM | POA: Diagnosis not present

## 2016-02-07 DIAGNOSIS — Z8673 Personal history of transient ischemic attack (TIA), and cerebral infarction without residual deficits: Secondary | ICD-10-CM

## 2016-02-07 DIAGNOSIS — R29818 Other symptoms and signs involving the nervous system: Secondary | ICD-10-CM | POA: Diagnosis not present

## 2016-02-07 DIAGNOSIS — G43809 Other migraine, not intractable, without status migrainosus: Secondary | ICD-10-CM

## 2016-02-07 LAB — DIFFERENTIAL
BASOS ABS: 0.1 10*3/uL (ref 0.0–0.1)
Basophils Relative: 1 %
EOS ABS: 0.2 10*3/uL (ref 0.0–0.7)
EOS PCT: 2 %
LYMPHS ABS: 2.6 10*3/uL (ref 0.7–4.0)
LYMPHS PCT: 33 %
MONOS PCT: 9 %
Monocytes Absolute: 0.7 10*3/uL (ref 0.1–1.0)
Neutro Abs: 4.3 10*3/uL (ref 1.7–7.7)
Neutrophils Relative %: 55 %

## 2016-02-07 LAB — URINALYSIS, ROUTINE W REFLEX MICROSCOPIC
Bilirubin Urine: NEGATIVE
Glucose, UA: NEGATIVE mg/dL
Hgb urine dipstick: NEGATIVE
KETONES UR: NEGATIVE mg/dL
LEUKOCYTES UA: NEGATIVE
NITRITE: NEGATIVE
PROTEIN: NEGATIVE mg/dL
Specific Gravity, Urine: 1.008 (ref 1.005–1.030)
pH: 5 (ref 5.0–8.0)

## 2016-02-07 LAB — I-STAT TROPONIN, ED: TROPONIN I, POC: 0 ng/mL (ref 0.00–0.08)

## 2016-02-07 LAB — COMPREHENSIVE METABOLIC PANEL
ALBUMIN: 4.2 g/dL (ref 3.5–5.0)
ALK PHOS: 61 U/L (ref 38–126)
ALT: 37 U/L (ref 14–54)
AST: 24 U/L (ref 15–41)
Anion gap: 9 (ref 5–15)
BILIRUBIN TOTAL: 0.6 mg/dL (ref 0.3–1.2)
BUN: 15 mg/dL (ref 6–20)
CALCIUM: 9.3 mg/dL (ref 8.9–10.3)
CO2: 22 mmol/L (ref 22–32)
Chloride: 107 mmol/L (ref 101–111)
Creatinine, Ser: 0.77 mg/dL (ref 0.44–1.00)
GFR calc Af Amer: >60 mL/min (ref >60)
GLUCOSE: 103 mg/dL — AB (ref 65–99)
POTASSIUM: 4 mmol/L (ref 3.5–5.1)
Sodium: 138 mmol/L (ref 135–145)
TOTAL PROTEIN: 7.1 g/dL (ref 6.5–8.1)

## 2016-02-07 LAB — I-STAT CHEM 8, ED
BUN: 16 mg/dL (ref 6–20)
CALCIUM ION: 1.09 mmol/L — AB (ref 1.15–1.40)
CREATININE: 0.7 mg/dL (ref 0.44–1.00)
Chloride: 107 mmol/L (ref 101–111)
GLUCOSE: 99 mg/dL (ref 65–99)
HCT: 41 % (ref 36.0–46.0)
HEMOGLOBIN: 13.9 g/dL (ref 12.0–15.0)
Potassium: 4.1 mmol/L (ref 3.5–5.1)
Sodium: 140 mmol/L (ref 135–145)
TCO2: 22 mmol/L (ref 0–100)

## 2016-02-07 LAB — RAPID URINE DRUG SCREEN, HOSP PERFORMED
Amphetamines: NOT DETECTED
BARBITURATES: NOT DETECTED
Benzodiazepines: NOT DETECTED
COCAINE: NOT DETECTED
Opiates: NOT DETECTED
TETRAHYDROCANNABINOL: NOT DETECTED

## 2016-02-07 LAB — CBC
HEMATOCRIT: 39.5 % (ref 36.0–46.0)
HEMOGLOBIN: 13.4 g/dL (ref 12.0–15.0)
MCH: 31.2 pg (ref 26.0–34.0)
MCHC: 33.9 g/dL (ref 30.0–36.0)
MCV: 92.1 fL (ref 78.0–100.0)
Platelets: 231 10*3/uL (ref 150–400)
RBC: 4.29 MIL/uL (ref 3.87–5.11)
RDW: 14.1 % (ref 11.5–15.5)
WBC: 7.8 10*3/uL (ref 4.0–10.5)

## 2016-02-07 LAB — PROTIME-INR
INR: 0.97
Prothrombin Time: 12.9 seconds (ref 11.4–15.2)

## 2016-02-07 LAB — CBG MONITORING, ED: GLUCOSE-CAPILLARY: 102 mg/dL — AB (ref 65–99)

## 2016-02-07 LAB — ETHANOL: Alcohol, Ethyl (B): <5 mg/dL (ref <5)

## 2016-02-07 LAB — APTT: aPTT: 32 seconds (ref 24–36)

## 2016-02-07 MED ORDER — LORAZEPAM 2 MG/ML IJ SOLN
0.5000 mg | Freq: Once | INTRAMUSCULAR | Status: AC
  Administered 2016-02-07: 0.5 mg via INTRAVENOUS
  Filled 2016-02-07: qty 1

## 2016-02-07 MED ORDER — PROCHLORPERAZINE EDISYLATE 5 MG/ML IJ SOLN
10.0000 mg | Freq: Once | INTRAMUSCULAR | Status: AC
  Administered 2016-02-07: 10 mg via INTRAVENOUS
  Filled 2016-02-07: qty 2

## 2016-02-07 NOTE — Code Documentation (Signed)
55 yo [facility] as a Code Stroke. Pt states she was driving home from the gym today when around 1530 noticed some right sided weakness and numbness and thus called EMS. EMS states pt to have right arm drift and right facial droop. [facility], labs obtained and pt taken to CT. NIHSS of 1 for sensory loss. Pt reports numbness in her right face and patchy areas of numbness in her RUE and RLE. Pt reports a prior stroke about 4 mo ago with residual LUE weakness. Per neurologist, pt to stay in the window until 2000. Bedside handoff with ED RN Denny PeonErin.

## 2016-02-07 NOTE — ED Notes (Signed)
Pt wishes to speak to EDP before medication administration. PA made aware.

## 2016-02-07 NOTE — ED Notes (Signed)
Patient transported to MRI 

## 2016-02-07 NOTE — Discharge Instructions (Signed)
Please follow up with your neurologist for re-evaluation. Your MRI today did not show any sign of stroke.

## 2016-02-07 NOTE — ED Notes (Signed)
Pt transported to MRI 

## 2016-02-07 NOTE — Consult Note (Signed)
Requesting Physician: ED M.D.    Chief Complaint: Code stroke  History obtained from:  Patient     HPI:                                                                                                                                         Jenna Delgado is an 55 y.o. female who has been to the hospital multiple times with TIAs history of stroke. Patient today at approximately 1500 hrs. was noticing that her right face started to feel tingly and then she felt as though she had weakness in the right arm. EMS was called patient was brought to [facility] as a code stroke. On initial evaluation patient was speaking with symmetrical face but when asked to smile she would pull the left side of her face to the side.  CT of head was negative for acute stroke.    Date last known well: Date: 02/07/2016 Time last known well: Time: 15:00 tPA Given: No: Minimal symptoms   Past Medical History:  Diagnosis Date  . Asthma   . Diabetes mellitus without complication (HCC)   . Mild bipolar disorder (HCC)   . Prediabetes   . Seasonal allergic rhinitis   . Stroke (HCC)   . Vitamin D deficiency     Past Surgical History:  Procedure Laterality Date  . ABDOMINAL HYSTERECTOMY    . BLADDER SUSPENSION    . EP IMPLANTABLE DEVICE N/A 10/03/2015   Procedure: Loop Recorder Insertion;  Surgeon: Marinus Maw, MD;  [facility] INVAS[facility] CV LAB;  Service: Cardiovascular;  Laterality: N/A;  . TEE WITHOUT CARDIOVERSION N/A 10/03/2015   Procedure: TRANSESOPHAGEAL ECHOCARDIOGRAM (TEE);  Surgeon: Vesta Mixer, MD;  Location: [facility] ENDOSCOPY;  Service: Cardiovascular;  Laterality: N/A;    Family History  Problem Relation Age of Onset  . Adopted: Yes   Social History:  reports that she has never smoked. She has never used smokeless tobacco. She reports that she drinks about 1.2 oz of alcohol per week . She reports that she does not use drugs.  Allergies:  Allergies  Allergen Reactions  . Benadryl  [Diphenhydramine Hcl] Palpitations and Other (See Comments)    Heartpounding, panic attack  . Imitrex [Sumatriptan] Palpitations and Other (See Comments)    Heartpounding, panic attack  . Iodine Other (See Comments)    IVP test problems  . Ivp Dye [Iodinated Diagnostic Agents] Other (See Comments)    IVP test problems  . Lamictal [Lamotrigine] Rash  . Lobster [Shellfish Allergy] Itching  . Metoclopramide Anxiety  . Tetracyclines & Related Rash    Blisters down the throat  . Theophyllines Rash    Target rash    Medications:  No current facility-administered medications for this encounter.    Current Outpatient Prescriptions  Medication Sig Dispense Refill  . aspirin 325 MG tablet Take 1 tablet (325 mg total) by mouth daily. 30 tablet 0  . atorvastatin (LIPITOR) 80 MG tablet Take 1 tablet (80 mg total) by mouth daily at 6 PM. 30 tablet 1  . citalopram (CELEXA) 20 MG tablet Take 20 mg by mouth daily.    . clonazePAM (KLONOPIN) 0.5 MG tablet Take 0.5 mg by mouth daily as needed for anxiety.     [name]. esomeprazole (NEXIUM) 20 MG capsule Take 20 mg by mouth daily as needed (for heartburn or acid reflux).        ROS:                                                                                                                                       History obtained from the patient  General ROS: negative for - chills, fatigue, fever, night sweats, weight gain or weight loss Psychological ROS: negative for - behavioral disorder, hallucinations, memory difficulties, mood swings or suicidal ideation Ophthalmic ROS: negative for - blurry vision, double vision, eye pain or loss of vision ENT ROS: negative for - epistaxis, nasal discharge, oral lesions, sore throat, tinnitus or vertigo Allergy and Immunology ROS: negative for - hives or itchy/watery eyes Hematological and  Lymphatic ROS: negative for - bleeding problems, bruising or swollen lymph nodes Endocrine ROS: negative for - galactorrhea, hair pattern changes, polydipsia/polyuria or temperature intolerance Respiratory ROS: negative for - cough, hemoptysis, shortness of breath or wheezing Cardiovascular ROS: negative for - chest pain, dyspnea on exertion, edema or irregular heartbeat Gastrointestinal ROS: negative for - abdominal pain, diarrhea, hematemesis, nausea/vomiting or stool incontinence Genito-Urinary ROS: negative for - dysuria, hematuria, incontinence or urinary frequency/urgency Musculoskeletal ROS: negative for - joint swelling or muscular weakness Neurological ROS: as noted in HPI Dermatological ROS: negative for rash and skin lesion changes  Neurologic Examination:                                                                                                      There were no vitals taken for this visit.  HEENT-  Normocephalic, no lesions, without obvious abnormality.  Normal external eye and conjunctiva.  Normal TM's bilaterally.  Normal auditory canals and external ears. Normal external nose, mucus membranes and septum.  Normal pharynx. Cardiovascular- S1, S2 normal, pulses palpable throughout   Lungs- chest clear, no wheezing, rales, normal symmetric  air entry Abdomen- soft, non-tender; bowel sounds normal; no masses,  no organomegaly Extremities- no edema Lymph-no adenopathy palpable Musculoskeletal-no joint tenderness, deformity or swelling Skin-warm and dry, no hyperpigmentation, vitiligo, or suspicious lesions  Neurological Examination Mental Status: Alert, oriented, thought content appropriate.  Speech fluent without evidence of aphasia.  Able to follow 3 step commands without difficulty. Cranial Nerves: II:  Visual fields grossly normal, pupils equal, round, reactive to light and accommodation III,IV, VI: ptosis not present, extra-ocular motions intact bilaterally V,VII:  smile symmetric when speaking however when asked to smile she would pull the left side of her face toward the side and had no air leak. Speech was clear., facial light touch sensation decreased on the right. VIII: hearing normal bilaterally IX,X: uvula rises symmetrically XI: bilateral shoulder shrug XII: midline tongue extension Motor: Right : Upper extremity   5/5    Left:     Upper extremity   5/5  Lower extremity   5/5     Lower extremity   5/5 --Patient had significant give way strength with her right arm. No drift was noted. Tone and bulk:normal tone throughout; no atrophy noted Sensory: Patient states that she had decreased sensation on her right Deep Tendon Reflexes: 2+ and symmetric throughout Plantars: Right: downgoing   Left: downgoing Cerebellar: normal finger-to-nose,  and normal heel-to-shin test Gait: normal gait and station       Lab Results: Basic Metabolic Panel: No results for input(s): NA, K, CL, CO2, GLUCOSE, BUN, CREATININE, CALCIUM, MG, PHOS in the last 168 hours.  Liver Function Tests: No results for input(s): AST, ALT, ALKPHOS, BILITOT, PROT, ALBUMIN in the last 168 hours. No results for input(s): LIPASE, AMYLASE in the last 168 hours. No results for input(s): AMMONIA in the last 168 hours.  CBC: No results for input(s): WBC, NEUTROABS, HGB, HCT, MCV, PLT in the last 168 hours.  Cardiac Enzymes: No results for input(s): CKTOTAL, CKMB, CKMBINDEX, TROPONINI in the last 168 hours.  Lipid Panel: No results for input(s): CHOL, TRIG, HDL, CHOLHDL, VLDL, LDLCALC in the last 168 hours.  CBG: No results for input(s): GLUCAP in the last 168 hours.  Microbiology: No results found for this or any previous visit.  Coagulation Studies: No results for input(s): LABPROT, INR in the last 72 hours.  Imaging: No results found.   Felicie MornDavid Smith PA-C [facility] [phone]  02/07/2016, 4:13 PM     Assessment: 55 y.o. female [facility] after noting right-sided facial numbness and than right arm weakness that started at approximately 1500 hrs. She has multiple findings on exam concerning for nonorganic etiology, however she does have a history of strokes. I think that further evaluation is necessary with MRI. Also possible be complicated migraine with some overlay.  Stroke Risk Factors - diabetes mellitus  1) MRI brain 2) if negative, I would treat as complicated migraine and the patient can be discharged home.  Jenna SlotMcNeill Mercy Malena, MD [facility] [phone]  If 7pm- 7am, please page neurology on call as listed in AMION.

## 2016-02-07 NOTE — ED Notes (Signed)
MRI called reporting pt stating she is feeling "claustrophobic." PA made aware; RN administered Ativan in MRI.

## 2016-02-07 NOTE — ED Triage Notes (Signed)
Pt reports she was leaving the gym when she began having right sided weakness along with right sided numbness and tingling.  Pt A&Ox4  And in NAD

## 2016-02-07 NOTE — ED Provider Notes (Signed)
[facility]-EM[facility] DEPT Provider Note   CSN: [id number] Arrival date & time: 02/07/16  1608   An emergency department physician performed an initial assessment on this suspected stroke patient at 1609.  History   Chief Complaint Chief Complaint  Patient presents with  . Stroke Symptoms    HPI Jenna Delgado is a 55 y.o. female with a pmhx of DM, bipolar, CVA who presents to the ED today c/o numbness to right face, arm and right leg. Pt states that she was driving home from the gym when she felt a numbness sensation to the right side of her face and eyelid. Pt states that she went to wipe her eye with her left hand and realized that she could'nt feel her face as well. She subsequently began experiencing the same numbness sensation in "patches" over her right arm and right leg. She also feels like the right side of her mouth is drooping and her smile is not symmetrical. Pt denies any HA, blurry vision, CP. Pt has hx of CVA 4 months ago and states that she had similar symptoms but on her left side. She does not have any residual neurological defects. Pt takes 1 ASA/day, no other blood thinners.   HPI  Past Medical History:  Diagnosis Date  . Asthma   . Diabetes mellitus without complication (HCC)   . Mild bipolar disorder (HCC)   . Prediabetes   . Seasonal allergic rhinitis   . Stroke (HCC)   . Vitamin D deficiency     Patient Active Problem List   Diagnosis Date Noted  . TIA (transient ischemic attack) 10/28/2015  . Cerebrovascular accident (CVA) (HCC)   . Abnormal LFTs   . Cerebral infarction due to unspecified mechanism   . Acute CVA (cerebrovascular accident) (HCC) 09/29/2015  . Abnormal liver function test 09/29/2015  . Asthma 09/29/2015  . Bipolar disorder (HCC) 09/29/2015  . Hyperlipidemia 09/26/2015  . Stroke (cerebrum) (HCC) 09/25/2015  . Hyperglycemia 09/25/2015  . Acute ischemic stroke [facility])     Past Surgical History:  Procedure Laterality Date  . ABDOMINAL  HYSTERECTOMY    . BLADDER SUSPENSION    . EP IMPLANTABLE DEVICE N/A 10/03/2015   Procedure: Loop Recorder Insertion;  Surgeon: Marinus Maw, MD;  [facility] INVAS[facility] CV LAB;  Service: Cardiovascular;  Laterality: N/A;  . TEE WITHOUT CARDIOVERSION N/A 10/03/2015   Procedure: TRANSESOPHAGEAL ECHOCARDIOGRAM (TEE);  Surgeon: Vesta Mixer, MD;  Location: [facility] ENDOSCOPY;  Service: Cardiovascular;  Laterality: N/A;    OB History    No data available       Home Medications    Prior to Admission medications   Medication Sig Start Date End Date Taking? Authorizing Provider  aspirin 325 MG tablet Take 1 tablet (325 mg total) by mouth daily. 09/27/15  Yes Catarina Hartshorn, MD  citalopram (CELEXA) 20 MG tablet Take 20 mg by mouth daily.   Yes Historical Provider, MD  esomeprazole (NEXIUM) 20 MG capsule Take 20 mg by mouth daily as needed (for heartburn or acid reflux).  06/24/15  Yes Historical Provider, MD  ibuprofen (ADVIL,MOTRIN) 200 MG tablet Take 400 mg by mouth every 6 (six) hours as needed.   Yes Historical Provider, MD  clonazePAM (KLONOPIN) 0.5 MG tablet Take 0.5 mg by mouth daily as needed for anxiety.  06/24/15   Historical Provider, MD    Family History Family History  Problem Relation Age of Onset  . Adopted: Yes    Social History Social History  Substance  Use Topics  . Smoking status: Former Games developermoker  . Smokeless tobacco: Never Used  . Alcohol use Yes     Comment: occasionally     Allergies   Benadryl [diphenhydramine hcl]; Imitrex [sumatriptan]; Atorvastatin; Iodine; Ivp dye [iodinated diagnostic agents]; Lamictal [lamotrigine]; Lobster [shellfish allergy]; Metoclopramide; Tetracyclines & related; and Theophyllines   Review of Systems Review of Systems  All other systems reviewed and are negative.    Physical Exam Updated Vital Signs BP 121/72   Pulse 66   Temp 98.4 F (36.9 C)   Resp 18   Ht 5\' 4"  (1.626 m)   Wt 87.5 kg   SpO2 97%   BMI 33.11 kg/m   Physical  Exam  Constitutional: She is oriented to person, place, and time. She appears well-developed and well-nourished. No distress.  HENT:  Head: Normocephalic and atraumatic.  Mouth/Throat: No oropharyngeal exudate.  Eyes: Conjunctivae and EOM are normal. Pupils are equal, round, and reactive to light. Right eye exhibits no discharge. Left eye exhibits no discharge. No scleral icterus.  Cardiovascular: Normal rate, regular rhythm, normal heart sounds and intact distal pulses.  Exam reveals no gallop and no friction rub.   No murmur heard. Pulmonary/Chest: Effort normal and breath sounds normal. No respiratory distress. She has no wheezes. She has no rales. She exhibits no tenderness.  Abdominal: Soft. She exhibits no distension. There is no tenderness. There is no guarding.  Musculoskeletal: Normal range of motion. She exhibits no edema.  Neurological: She is alert and oriented to person, place, and time. No cranial nerve deficit.  Strength 5/5 throughout. No sensory deficits. No gait abnormality. No dysmetria. No slurred speech. No facial droop. Negative pronator drift.    Skin: Skin is warm and dry. No rash noted. She is not diaphoretic. No erythema. No pallor.  Psychiatric: She has a normal mood and affect. Her behavior is normal.  Nursing note and vitals reviewed.    ED Treatments / Results  Labs (all labs ordered are listed, but only abnormal results are displayed) Labs Reviewed  COMPREHENSIVE METABOLIC PANEL - Abnormal; Notable for the following:       Result Value   Glucose, Bld 103 (*)    All other components within normal limits  I-STAT CHEM 8, ED - Abnormal; Notable for the following:    Calcium, Ion 1.09 (*)    All other components within normal limits  CBG MONITORING, ED - Abnormal; Notable for the following:    Glucose-Capillary 102 (*)    All other components within normal limits  ETHANOL  PROTIME-INR  APTT  CBC  DIFFERENTIAL  RAPID URINE DRUG SCREEN, HOSP PERFORMED    URINALYSIS, ROUTINE W REFLEX MICROSCOPIC (NOT AT [facility])  Rosezena SensorI-STAT TROPOININ, ED    EKG  EKG Interpretation None       Radiology Mr Brain Wo Contrast  Result Date: 02/07/2016 CLINICAL DATA:  55 y/o  F; right-sided weakness and numbness. EXAM: MRI HEAD WITHOUT CONTRAST TECHNIQUE: Multiplanar, multiecho pulse sequences of the brain and surrounding structures were obtained without intravenous contrast. COMPARISON:  02/07/2016 CT head.  12/12/2015 MRI head. FINDINGS: Brain: No diffusion signal abnormality. No focal mass effect. No abnormal susceptibility hypointensity to indicate intracranial hemorrhage. Stable few foci of T2 FLAIR hyperintensity in subcortical and periventricular white matter compatible with mild chronic microvascular ischemic changes. Small focus of T2 FLAIR hyperintensity in right precentral gyrus from prior infarct. Normal ventricle size. No extra-axial collection. No effacement of basilar cisterns per Vascular: Normal flow voids. Skull  and upper cervical spine: Normal marrow signal. Sinuses/Orbits: Ethmoid and maxillary sinus mucosal thickening and small mucous retention cysts is mildly improved. No abnormal signal of mastoid air cells. Orbits are unremarkable. Other: None. IMPRESSION: No evidence of acute/ early subacute infarct, intracranial hemorrhage, or focal mass effect. Mild improvement of paranasal sinus disease. Otherwise stable from 12/12/2015. Electronically Signed   By: Mitzi HansenLance  Furusawa-Stratton M.D.   On: 02/07/2016 19:32   Ct Head Code Stroke W/o Cm  Result Date: 02/07/2016 CLINICAL DATA:  Code stroke.  Right-sided weakness and facial droop. EXAM: CT HEAD WITHOUT CONTRAST TECHNIQUE: Contiguous axial images were obtained from the base of the skull through the vertex without intravenous contrast. COMPARISON:  12/12/2015 MR head and CT head. FINDINGS: Brain: No evidence of acute infarction, hemorrhage, hydrocephalus, extra-axial collection or mass lesion/mass effect. Few  foci of hypoattenuation in white matter compatible with mild chronic microvascular ischemic changes. Vascular: No hyperdense vessel identified. Skull: Normal. Negative for fracture or focal lesion. Sinuses/Orbits: Mild mucosal thickening of ethmoid and maxillary sinuses. Mastoid air cells are normally pneumatized. Orbits are unremarkable. Other: None. ASPECTS [facility]([place] Stroke Program Early CT Score) - Ganglionic level infarction (caudate, lentiform nuclei, internal capsule, insula, M1-M3 cortex): 7 - Supraganglionic infarction (M4-M6 cortex): 3 Total score (0-10 with 10 being normal): 10 IMPRESSION: 1. No acute intracranial abnormality identified. If symptoms persist or if clinically indicated MRI is more sensitive for acute stroke. 2. Mild chronic microvascular ischemic changes. Mild paranasal sinus disease. 3. ASPECTS is 10 These results were called by telephone at the time of interpretation on 02/07/2016 at 4:27 pm to Dr. Amada JupiterKirkpatrick, who verbally acknowledged these results. Electronically Signed   By: Mitzi HansenLance  Furusawa-Stratton M.D.   On: 02/07/2016 16:29    Procedures Procedures (including critical care time)  Medications Ordered in ED Medications  prochlorperazine (COMPAZINE) injection 10 mg (10 mg Intravenous Given 02/07/16 1748)  LORazepam (ATIVAN) injection 0.5 mg (0.5 mg Intravenous Given 02/07/16 1832)     Initial Impression / Assessment and Plan / ED Course  I have reviewed the triage vital signs and the nursing notes.  Pertinent labs & imaging results that were available during my care of the patient were reviewed by me and considered in my medical decision making (see chart for details).  Clinical Course     55 year old female with history of CVA presents to the ED today complaining of sudden onset numbness sensation in the right side of her face, right arm and right lower extremity. Symptoms onset began 1 hour prior to arrival. Presentation ED, patient does not have any neurological  deficits noted on exam. Neurologist at bedside, Dr. Amada JupiterKirkpatrick who recommends MRI brain to rule out stroke. CT head was negative. Per Dr. Amada JupiterKirkpatrick, if MRI brain normal, DC home and treated as complex migraine. Patient given Compazine in the ED with significant symptomatic relief. MRI did not show any sign of stroke. Patient is scheduled follow-up with neurology later this month. Discussed treatment plan with patient who is agreeable. Return precautions outlined in patient discharge instructions.  Case discussed with Dr. Rosalia Hammersay who agrees with treatment plan.  Final Clinical Impressions(s) / ED Diagnoses   Final diagnoses:  None    New Prescriptions New Prescriptions   No medications on file     Dub MikesSamantha Tripp Rodolph Hagemann, PA-C 02/08/16 0024    Margarita Grizzleanielle Ray, MD 02/16/16 1515

## 2016-02-07 NOTE — ED Notes (Signed)
Pt's CBG 102.  Informed Shanda BumpsJessica, RN and Wilmer FloorErin Ha, RN.

## 2016-02-09 DIAGNOSIS — T466X5A Adverse effect of antihyperlipidemic and antiarteriosclerotic drugs, initial encounter: Secondary | ICD-10-CM | POA: Diagnosis not present

## 2016-02-09 DIAGNOSIS — I6930 Unspecified sequelae of cerebral infarction: Secondary | ICD-10-CM | POA: Diagnosis not present

## 2016-02-09 DIAGNOSIS — Z8669 Personal history of other diseases of the nervous system and sense organs: Secondary | ICD-10-CM | POA: Diagnosis not present

## 2016-02-09 DIAGNOSIS — M25551 Pain in right hip: Secondary | ICD-10-CM | POA: Diagnosis not present

## 2016-02-11 LAB — CUP PACEART REMOTE DEVICE CHECK
Date Time Interrogation Session: 20171001180910
MDC IDC PG IMPLANT DT: 20170703

## 2016-02-11 NOTE — Progress Notes (Signed)
Carelink summary report received. Battery status OK. Normal device function. No new symptom episodes, tachy episodes, brady, or pause episodes. No new AF episodes. Monthly summary reports and ROV/PRN 

## 2016-02-13 DIAGNOSIS — F4310 Post-traumatic stress disorder, unspecified: Secondary | ICD-10-CM | POA: Diagnosis not present

## 2016-02-14 DIAGNOSIS — F411 Generalized anxiety disorder: Secondary | ICD-10-CM | POA: Diagnosis not present

## 2016-02-14 DIAGNOSIS — F3342 Major depressive disorder, recurrent, in full remission: Secondary | ICD-10-CM | POA: Diagnosis not present

## 2016-02-27 ENCOUNTER — Telehealth: Payer: Self-pay | Admitting: Neurology

## 2016-02-27 ENCOUNTER — Ambulatory Visit: Payer: BLUE CROSS/BLUE SHIELD | Admitting: Neurology

## 2016-02-27 DIAGNOSIS — F411 Generalized anxiety disorder: Secondary | ICD-10-CM | POA: Diagnosis not present

## 2016-02-27 NOTE — Telephone Encounter (Signed)
Rn cal patients husband on DPR form. PTs husband stated his wife had a stroke,and loop recorder was placed in July 2017. Patients husband stated he was told it would be cover by Dr.Sethi while pt was in the hospital. PTs husband it may or may not be cover. Also the letter was cc to [facility] Pear[facility]. Rn stated if the letter states [facility] that's where the letter went. Rn stated a letter can be done for pt for medical necessity for the loop recorder. Rn stated a letter can be done next week when Dr. Pearlean BrownieSethi returns to the office. Pts husband verbalized understanding and stated he 180 days to address the letter. Rn stated the insurance makes the final decision when the letter is sent. Pt husband stated :" if this is not cover its going to be a problem." Rn stated a message will be sent to Dr. Pearlean BrownieSethi. A fax will be sent from the husband for insurance name on where to fax letter. Rn gave fax number of [phone]. Rn also stated sometimes getting copies of office notes from hospital and [facility] would be helpful.

## 2016-02-27 NOTE — Telephone Encounter (Signed)
Patient's husband is calling. He states Dr. Pearlean BrownieSethi ordered a Loop Recorder-cardiac implant for the patient while she was in the hospital the end of June. The implant was put in on July 3. Patient's husband states the insurance may or may not pay for this procedure but they need a letter explaining medical necessity. Patient's husband stated Dr. Pearlean BrownieSethi said insurance will cover the procedure. Please call and discuss.

## 2016-02-28 NOTE — Telephone Encounter (Signed)
I called the patient's husband to discuss the questions about the billing issues about his wife`s loop recorder insertion and advise him to fax me a copy of the letter received to the stroke Center to review and discuss with Hospital billing personnel to see what is the appropriate response

## 2016-02-29 DIAGNOSIS — M7061 Trochanteric bursitis, right hip: Secondary | ICD-10-CM | POA: Diagnosis not present

## 2016-02-29 NOTE — Telephone Encounter (Signed)
Rn call patients insurance company at [phone] to try to schedule a peer to peer for patient per Dr.Sethi request. Rn spoke with 3 customer services requesting an appt. Pt was transfer to dept pre authorization services to schedule peer to peer for MD. Pts loop recorder was denied . Rn was on hold for 26 minutes. Rn had to hang up. Rn will call back later.

## 2016-02-29 NOTE — Telephone Encounter (Signed)
Rn call [phone] to schedule a peer to peer review with Dr. Pearlean BrownieSethi. Nurse call at 0345pm when she was done helping room patients. It took RN 20 minutes to get someone on the phone. Rn explain this is the second phone call trying to schedule a peer to peer review. Rn also explain earlier this am it was a 26 minute hold time, and nurse had to hang up. Rn explain pts loop recorder implant was denied by [employer] insurance company. RN was finally transfer to preauthorization dept to schedule peer to peer.Rn was on hold for 45 minutes. Rn had to hang up because office closed at 0500pm.

## 2016-03-01 ENCOUNTER — Ambulatory Visit (INDEPENDENT_AMBULATORY_CARE_PROVIDER_SITE_OTHER): Payer: BLUE CROSS/BLUE SHIELD | Admitting: *Deleted

## 2016-03-01 DIAGNOSIS — I639 Cerebral infarction, unspecified: Secondary | ICD-10-CM

## 2016-03-01 NOTE — Progress Notes (Signed)
Carelink Summary Report / Loop Recorder 

## 2016-03-02 ENCOUNTER — Encounter: Payer: Self-pay | Admitting: Neurology

## 2016-03-05 DIAGNOSIS — F411 Generalized anxiety disorder: Secondary | ICD-10-CM | POA: Diagnosis not present

## 2016-03-08 DIAGNOSIS — F4310 Post-traumatic stress disorder, unspecified: Secondary | ICD-10-CM | POA: Diagnosis not present

## 2016-03-08 NOTE — Telephone Encounter (Signed)
Letter is done Dr.Sethi will review and fax to insurance today after he is done seeing patients. A copy will be mailed to patient. Pts loop recorder was denied, a letter was done for approval.

## 2016-03-09 NOTE — Telephone Encounter (Signed)
Appeal letter sent to [employer] at [phone] [place] [phone].

## 2016-03-09 NOTE — Telephone Encounter (Signed)
Rn call patients husband about the appeal letter for his wifes loop recorder. Rn stated there was no fax number listed on any of the letters. Rn ask pts husband if he has a fax number. Pts husband stated he does not have a fax number for appeals. Rn explain there is a address for appeals. Rn stated a letter will be sent to the appeals address listed along with literature. Also a copy will be sent to the patient.Pts husband ask " how much will these cost". Rn explain she is not sure because we dont do loop recorders at this office. Pts husband verbalized understanding.

## 2016-03-10 LAB — CUP PACEART REMOTE DEVICE CHECK
Implantable Pulse Generator Implant Date: 20170703
MDC IDC SESS DTM: 20171031180819

## 2016-03-10 NOTE — Progress Notes (Signed)
Carelink summary report received. Battery status OK. Normal device function. No new symptom episodes, tachy episodes, brady, or pause episodes. No new AF episodes. Monthly summary reports and ROV/PRN 

## 2016-03-12 DIAGNOSIS — F411 Generalized anxiety disorder: Secondary | ICD-10-CM | POA: Diagnosis not present

## 2016-03-19 DIAGNOSIS — F411 Generalized anxiety disorder: Secondary | ICD-10-CM | POA: Diagnosis not present

## 2016-03-21 ENCOUNTER — Ambulatory Visit (INDEPENDENT_AMBULATORY_CARE_PROVIDER_SITE_OTHER): Payer: BLUE CROSS/BLUE SHIELD | Admitting: Neurology

## 2016-03-21 ENCOUNTER — Encounter: Payer: Self-pay | Admitting: Neurology

## 2016-03-21 VITALS — BP 121/79 | HR 66 | Resp 20 | Ht 64.0 in | Wt 192.0 lb

## 2016-03-21 DIAGNOSIS — G479 Sleep disorder, unspecified: Secondary | ICD-10-CM

## 2016-03-21 DIAGNOSIS — G2581 Restless legs syndrome: Secondary | ICD-10-CM

## 2016-03-21 DIAGNOSIS — G4761 Periodic limb movement disorder: Secondary | ICD-10-CM

## 2016-03-21 MED ORDER — PRAMIPEXOLE DIHYDROCHLORIDE 0.125 MG PO TABS: ORAL_TABLET | ORAL | 5 refills | Status: DC

## 2016-03-21 NOTE — Patient Instructions (Signed)
Your sleep study did not show any significant obstructive sleep apnea. But you do have sleep apnea events when in REM/dream sleep. Please try to lose weight and sleep on your sides.  You severe leg twitching in sleep, which did not disturb your sleep very much.  You can be monitored for restless legs symptoms. Keep in mind restless legs syndrome (RLS) is associated with anemia and iron deficiency.  We will check blood work today and call you with the test results. You may benefit from taking oral iron supplements.   Please remember to try to maintain good sleep hygiene, which means: Keep a regular sleep and wake schedule, try not to exercise or have a meal within 2 hours of your bedtime, try to keep your bedroom conducive for sleep, that is, cool and dark, without light distractors such as an illuminated alarm clock, and refrain from watching TV right before sleep or in the middle of the night and do not keep the TV or radio on during the night. Also, try not to use or play on electronic devices at bedtime, such as your cell phone, tablet PC or laptop. If you like to read at bedtime on an electronic device, try to dim the background light as much as possible. Do not eat in the middle of the night.   I will see you back in 4 months.

## 2016-03-21 NOTE — Progress Notes (Signed)
Subjective:    Patient ID: Jenna Delgado is a 55 y.o. female.  HPI     Interim history:   Jenna Delgado is a 55 year old right-handed woman with an underlying medical history of diabetes, asthma, allergic rhinitis, vitamin D deficiency, mood disorder, and stroke in June 2017 and TIA in July 2017, as well as obesity, who presents for follow-up consultation after her recent sleep study. The patient is unaccompanied today. I first met her on 12/01/2015 at the request of Dr. Leonie Man, at which time she reported snoring and excessive daytime somnolence. I invited her for sleep study. She had a baseline sleep study on 12/27/2015. I went over her test results with her in detail today. Sleep latency was markedly prolonged at 212 minutes. She had a decreased sleep efficiency at 59.9%. She had a prolonged REM latency of 223.5 minutes. REM percentage was reduced at 9.4%, she had a mildly increased percentage of stage II sleep and an increased percentage of stage I sleep at 12%. Overall AHI was 3.8 per hour, REM AHI was 26.7 per hour, supine AHI was mildly elevated at 7.9 per hour. She had mild to moderate snoring. She had severe periodic limb movements with an index of 98.8, associated with 18.8 arousals per hour. Average oxygen saturation was 96%, nadir was 88%.  Today, 03/21/2016: She reports doing about the same, no new sleep related issues. She has ongoing problems going to sleep and staying asleep. Has a long-standing history of restless leg syndrome and leg twitching and night, particularly bothersome to her husband.  She was seen in the emergency room in the interim twice, with complicated migraine. Stroke was ruled out each time with an MRI that was negative. She had improved symptoms with migraine cocktails each time. I reviewed the emergency room records from 12/12/2015 as well as 02/07/2016. She had a brain MRI without contrast on 12/12/2015: IMPRESSION: 1. No evidence of acute/early subacute infarct,  focal mass effect, or intracranial hemorrhage. 2. Small now chronic right posterior frontal infarct. 3. Mild paranasal sinus disease improved from prior MR.  She had a brain MRI without contrast on 02/07/2016:IMPRESSION: No evidence of acute/ early subacute infarct, intracranial hemorrhage, or focal mass effect. Mild improvement of paranasal sinus disease. Otherwise stable from 12/12/2015.   Previously:  12/01/2015: She reports snoring and excessive daytime somnolence. I reviewed your office note from 11/17/2015. She has trouble initiating and maintaining sleep, she reports restless leg symptoms and has had these for years. She also moves her legs and her sleep and this disturbs her husband sometimes including her snoring, so often they do not sleep in the same bed. He also snores. Since her stroke, she has had more fatigue and gets sleepy more quickly. Her Epworth sleepiness score is 10 out of 24 today, her fatigue score is 29 out of 63, bedtime varies, between 9 and 11 PM usually, wakeup time around 8 or 9 AM. 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 She has residual left-sided weakness particularly in her left hand since her stroke. She quit smoking over 15 years ago, drinks alcohol occasionally, caffeine occasionally. Her husband has noted occasional apneic pauses in her sleep but is not fully sure she states. She does not know her family history. She was adopted.  Her Past Medical History Is Significant For: Past Medical History:  Diagnosis Date  . Asthma   . Diabetes mellitus without complication ([place])   . Mild bipolar disorder ([place])   . Prediabetes   .  Seasonal allergic rhinitis   . Stroke ([place])   . Vitamin D deficiency     Her Past Surgical History Is Significant For: Past Surgical History:  Procedure Laterality Date  . ABDOMINAL HYSTERECTOMY    . BLADDER SUSPENSION    . EP IMPLANTABLE DEVICE N/A 10/03/2015   Procedure: Loop Recorder Insertion;  Surgeon: Evans Lance, MD;  Location:  [facility];  Service: Cardiovascular;  Laterality: N/A;  . TEE WITHOUT CARDIOVERSION N/A 10/03/2015   Procedure: TRANSESOPHAGEAL ECHOCARDIOGRAM (TEE);  Surgeon: Thayer Headings, MD;  [facility] END[facility];  Service: Cardiovascular;  Laterality: N/A;    Her Family History Is Significant For: Family History  Problem Relation Age of Onset  . Adopted: Yes    Her Social History Is Significant For: Social History   Social History  . Marital status: Married    Spouse name: N/A  . Number of children: N/A  . Years of education: N/A   Social History Main Topics  . Smoking status: Former Research scientist (life sciences)  . Smokeless tobacco: Never Used  . Alcohol use Yes     Comment: occasionally  . Drug use:     Types: Marijuana     Comment: occasionally  . Sexual activity: Not Asked   Other Topics Concern  . None   Social History Narrative   Drinks very little caffeine     Her Allergies Are:  Allergies  Allergen Reactions  . Benadryl [Diphenhydramine Hcl] Palpitations and Other (See Comments)    Heartpounding, panic attack  . Imitrex [Sumatriptan] Palpitations and Other (See Comments)    Heartpounding, panic attack  . Atorvastatin Other (See Comments)    Myalgias, got worse over time, was taken off med  . Iodine Other (See Comments)    IVP test problems  . Ivp Dye [Iodinated Diagnostic Agents] Other (See Comments)    IVP test problems  . Lamictal [Lamotrigine] Rash  . Lobster [Shellfish Allergy] Itching  . Metoclopramide Anxiety  . Tetracyclines & Related Rash    Blisters down the throat  . Theophyllines Rash    Target rash  :   Her Current Medications Are:  Outpatient Encounter Prescriptions as of 03/21/2016  Medication Sig  . aspirin 325 MG tablet Take 1 tablet (325 mg total) by mouth daily.  . citalopram (CELEXA) 20 MG tablet Take 20 mg by mouth daily.  . clonazePAM (KLONOPIN) 0.5 MG tablet Take 0.5 mg by mouth daily as needed for anxiety.   [name] esomeprazole (NEXIUM) 20 MG  capsule Take 20 mg by mouth daily as needed (for heartburn or acid reflux).   [name] ibuprofen (ADVIL,MOTRIN) 200 MG tablet Take 400 mg by mouth every 6 (six) hours as needed.   No facility-administered encounter medications on file as of 03/21/2016.   :  Review of Systems:  Out of a complete 14 point review of systems, all are reviewed and negative with the exception of these symptoms as listed below: Review of Systems  Neurological:       Pt presents today to discuss her sleep study results.    Objective:  Neurologic Exam  Physical Exam Physical Examination:   Vitals:   03/21/16 0944  BP: 121/79  Pulse: 66  Resp: 20    General Examination: The patient is a very pleasant 55 y.o. female in no acute distress. She appears well-developed and well-nourished and well groomed.   HEENT: Normocephalic, atraumatic, pupils are equal, round and reactive to light and accommodation. B/l esotropia noted. Mild lazy  eye both eyes, corrective eye glasses in place. Extraocular tracking is good without limitation to gaze excursion or nystagmus noted. Normal smooth pursuit is noted. Hearing is grossly intact. Face is symmetric with normal facial animation and normal facial sensation. Speech is clear with no dysarthria noted. There is no hypophonia. There is no lip, neck/head, jaw or voice tremor. Neck is supple with full range of passive and active motion. There are no carotid bruits on auscultation. Oropharynx exam reveals: mild mouth dryness, adequate dental hygiene and mild airway crowding, due to smaller airway entry. Mallampati is class II. Tongue protrudes centrally and palate elevates symmetrically. Tonsils are small.   Chest: Clear to auscultation without wheezing, rhonchi or crackles noted.  Heart: S1+S2+0, regular and normal without murmurs, rubs or gallops noted.   Abdomen: Soft, non-tender and non-distended with normal bowel sounds appreciated on auscultation.  Extremities: There is no pitting  edema in the distal lower extremities bilaterally. Pedal pulses are intact.  Skin: Warm and dry without trophic changes noted.   Musculoskeletal: exam reveals no obvious joint deformities, tenderness or joint swelling or erythema.   Neurologically:  Mental status: The patient is awake, alert and oriented in all 4 spheres. Her immediate and remote memory, attention, language skills and fund of knowledge are appropriate. There is no evidence of aphasia, agnosia, apraxia or anomia. Speech is clear with normal prosody and enunciation. Thought process is linear. Mood is normal and affect is normal.  Cranial nerves II - XII are as described above under HEENT exam. In addition: shoulder shrug is normal with equal shoulder height noted. Motor exam: Normal bulk, strength and tone is noted on the right, with 4+ out of 5 weakness on the left noted, stable. Romberg is negative. Reflexes are 2+ throughout. fine motor skills and coordination: intact on the right and slightly difficult with the left.  Sensory exam: intact to light touch in the upper and lower extremities.  Gait, station and balance: She stands easily. No veering to one side is noted. No leaning to one side is noted. Posture is age-appropriate and stance is narrow based. Gait shows normal findings, can do tandem walk.   Assessment and Plan:  In summary, Jenna Delgado is a very pleasant 55 year old female with an underlying medical history of diabetes, asthma, allergic rhinitis, vitamin D deficiency, RLS, mood disorder, and stroke in June 2017 and TIA in July 2017, as well as obesity, who presents for follow-up consultation of her sleep disturbance, after her recent sleep study. Her sleep study 12/27/2015 was discussed with her. Overall AHI was less than 5 per hour, but she does have evidence of moderate REM related OSA, O2 nadir was 88%, for this, CPAP therapy is not warranted and she is advised to try to lose weight and sleep on her sides. Of  note, the patient is working on weight loss and has reduced her Celexa. She had severe PLMS with moderate arousals. She does endorse restless leg syndrome and a long-standing history of PLMS. We talked about her sleep issues including difficulty falling asleep and staying asleep. Her RLS symptoms fluctuate which is typical for this disorder. She has a history of iron deficiency in the past. Her physical exam is stable to slightly improved. Thankfully, she had no residual severe weakness from her stroke. She has been in the emergency room a couple of times recently for what was presumed to be complex migraines. She is advised to make an appointment with Dr. Leonie Man to discuss  all of this including her recent ER visits, stroke follow-up, and difficulty tolerating statins. From a sleep standpoint, I suggested we start her on medication for restless legs syndrome and PLMS. We talked about different medication options and potential side effects and limitations as well as expectations and benefits. I suggested we start her on low dose pramipexole, starting at 0.125 mg strength with gradual titration, she is advised to start medication at night only, 90-120 minutes before her typical bedtime. We will titrate gradually to up to 0.375 mg for now, we do have room to increase after that. I gave her written instructions and a new prescription and we talked about potential side effects including impulse control disorder as a rare phenomenon associated with the use of dopamine agonists. She does not have a very good track records what comes to medication, she has multiple medication allergies and intolerances.  We will do blood work today including CBC with differential, ferritin and TIBC as well as are level. She may benefit from taking oral iron supplements. We will await test results and call her with those.  I will see her back in 4 months, sooner as needed. Answered all her questions today and she was in agreement. I spent  25 minutes in total face-to-face time with the patient, more than 50% of which was spent in counseling and coordination of care, reviewing test results, reviewing medication and discussing or reviewing the diagnosis of PLMs, RLS, sleep d/o, the prognosis and treatment options.

## 2016-03-22 ENCOUNTER — Telehealth: Payer: Self-pay

## 2016-03-22 DIAGNOSIS — F4310 Post-traumatic stress disorder, unspecified: Secondary | ICD-10-CM | POA: Diagnosis not present

## 2016-03-22 LAB — TSH: TSH: 2.35 u[IU]/mL (ref 0.450–4.500)

## 2016-03-22 LAB — CBC WITH DIFFERENTIAL/PLATELET
BASOS ABS: 0.1 10*3/uL (ref 0.0–0.2)
Basos: 1 %
EOS (ABSOLUTE): 0.4 10*3/uL (ref 0.0–0.4)
Eos: 6 %
Hematocrit: 40.9 % (ref 34.0–46.6)
Hemoglobin: 13.5 g/dL (ref 11.1–15.9)
Immature Grans (Abs): 0.1 10*3/uL (ref 0.0–0.1)
Immature Granulocytes: 1 %
LYMPHS ABS: 2.1 10*3/uL (ref 0.7–3.1)
LYMPHS: 33 %
MCH: 31 pg (ref 26.6–33.0)
MCHC: 33 g/dL (ref 31.5–35.7)
MCV: 94 fL (ref 79–97)
Monocytes Absolute: 0.6 10*3/uL (ref 0.1–0.9)
Monocytes: 9 %
NEUTROS ABS: 3.1 10*3/uL (ref 1.4–7.0)
Neutrophils: 50 %
PLATELETS: 233 10*3/uL (ref 150–379)
RBC: 4.35 x10E6/uL (ref 3.77–5.28)
RDW: 14.1 % (ref 12.3–15.4)
WBC: 6.3 10*3/uL (ref 3.4–10.8)

## 2016-03-22 LAB — FERRITIN: FERRITIN: 215 ng/mL — AB (ref 15–150)

## 2016-03-22 LAB — IRON AND TIBC
IRON SATURATION: 23 % (ref 15–55)
IRON: 68 ug/dL (ref 27–159)
TIBC: 299 ug/dL (ref 250–450)
UIBC: 231 ug/dL (ref 131–425)

## 2016-03-22 NOTE — Telephone Encounter (Signed)
I spoke to pt. I advised her that Dr. Frances FurbishAthar reviewed her labs and want pt to know that her iron and thyroid labs are okay, but the ferritin level is elevated, this could be caused by an acute infection, and is not an issue per se. Dr. Frances FurbishAthar said pt can have it rechecked in a couple months by PCP. Pt said that she has been well recently. Pt verbalized understanding of results. Pt had no questions at this time but was encouraged to call back if questions arise.

## 2016-03-22 NOTE — Progress Notes (Signed)
Please call Pt:  Iron and thyroid labs okay, ferritin, which is the storage iron bound to protein is elevated, per se not an issue, but can be elevated during acute infection. Can have it rechecked in a couple of months with PCP. Will proceed as discussed with RLS med.  Huston FoleySaima Neyla Gauntt, MD, PhD [facility])

## 2016-03-22 NOTE — Telephone Encounter (Signed)
-----   Message from Huston FoleySaima Athar, MD sent at 03/22/2016  8:13 AM EST ----- Please call Pt:  Iron and thyroid labs okay, ferritin, which is the storage iron bound to protein is elevated, per se not an issue, but can be elevated during acute infection. Can have it rechecked in a couple of months with PCP. Will proceed as discussed with RLS med.  Huston FoleySaima Athar, MD, PhD [facility])

## 2016-04-03 ENCOUNTER — Ambulatory Visit (INDEPENDENT_AMBULATORY_CARE_PROVIDER_SITE_OTHER): Payer: BLUE CROSS/BLUE SHIELD | Admitting: *Deleted

## 2016-04-03 DIAGNOSIS — I639 Cerebral infarction, unspecified: Secondary | ICD-10-CM

## 2016-04-04 ENCOUNTER — Ambulatory Visit: Payer: BLUE CROSS/BLUE SHIELD | Admitting: Neurology

## 2016-04-04 NOTE — Progress Notes (Signed)
[facility] Summary Report 

## 2016-04-09 ENCOUNTER — Ambulatory Visit (INDEPENDENT_AMBULATORY_CARE_PROVIDER_SITE_OTHER): Payer: BLUE CROSS/BLUE SHIELD | Admitting: Neurology

## 2016-04-09 ENCOUNTER — Encounter: Payer: Self-pay | Admitting: Neurology

## 2016-04-09 VITALS — BP 110/68 | HR 74 | Ht 64.0 in | Wt 189.8 lb

## 2016-04-09 DIAGNOSIS — F411 Generalized anxiety disorder: Secondary | ICD-10-CM | POA: Diagnosis not present

## 2016-04-09 DIAGNOSIS — E785 Hyperlipidemia, unspecified: Secondary | ICD-10-CM | POA: Diagnosis not present

## 2016-04-09 MED ORDER — SIMVASTATIN 10 MG PO TABS: 10.0000 mg | ORAL_TABLET | Freq: Every day | ORAL | 3 refills | Status: DC

## 2016-04-09 NOTE — Progress Notes (Signed)
NEUROLOGY CONSULTATION NOTE  AIANNA FAHS MRN: [id number] DOB: 17-Apr-1960  Primary care provider: Dr. Drema Dallas  Reason for consult:  stroke  HISTORY OF PRESENT ILLNESS: Jenna Delgado is a 56 year old right-handed woman with hyperlipidemia, restless leg syndrome and diet-controlled diabetes who presents for stroke.  History obtained by patient and hospital notes.  She was admitted to [facility] in June 2017 with presentation of left sided numbness and weakness.  As she was outside the therapeutic window, she did not receive tPA.  MRI of brain from 09/25/15 was personally reviewed and revealed small acute ischemic infarct in the right posterior frontal lobe.  MRA of head demonstrated no occlusion or significant proximal intracranial arterial stenosis.[name]  She was discharged on ASA 382m and Lipitor.  She returned to the hospital on 09/29/15 for speech difficulty and right sided numbness.  Repeat MRI of brain revealed evolutionary changes of the right frontal infarct but no new infarction.  She was readmitted to the hospital on 10/28/15 for TIA presenting as acute onset right-sided weakness and numbness with difficulty articulating words.  MRI of brain revealed known subacute infarct of right frontal region but no acute left hemispheric stroke to explain her symptoms.  MRA of head was unremarkable.  She presented to the ED on 12/12/15 with difficulty with spatial orientation.  On exam, she exhibited mild right upper extremity weakness.  MRI of brain revealed no new stroke.  She had associated headache, so complex migraine was suspected.  She then returned to the ED on 02/07/16 with right sided facial numbness, as well as patches of numbness involving the right arm and leg.  She demonstrated no objective deficits on exam.  MRI of brain was negative for acute stroke.  She was treated for potential complex migraine.  Her last spell (of unilateral numbness of either side and speech difficulty) occurred over a  month ago.  She initially reported anxiety, which as gotten better although she still has a feeling that she may die.  Stroke workup included: TTE and TEE showed normal EF with no cardiac source of emboli.   Carotid doppler revealed no hemodynamically significant stenosis.   Blood work:  LDL was 124 and Hgb A1c was 5.7.  ANA and ESR were normal.  Hypercoagulable panel (homocysteine, cardiolipin antibodies, prothrombin gene mutation, beta-2-glycoprotein, lupus anticoagulant, protein C and S, antithrombin III, factor V leiden) was negative.  Urine drug screen was positive for THC. She has an implantable loop recorder which so far has not revealed atrial fibrillation.  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  She demonstrated depression and adjustment disorder with anxiety as well.  She is taking ASA 3222mdaily for secondary stroke prevention.  She is no longer on a statin because it caused myalgias.  She tried Lipitor and low-dose Crestor.  PAST MEDICAL HISTORY: Past Medical History:  Diagnosis Date  . Asthma   . [place]  . Mild bipolar disorder ([facility]  . Prediabetes   . Seasonal allergic rhinitis   . [place]  . Vitamin D deficiency     PAST SURGICAL HISTORY: Past Surgical History:  Procedure Laterality Date  . ABDOMINAL HYSTERECTOMY    . BLADDER SUSPENSION    . EP IMPLANTABLE DEVICE N/A 10/03/2015   Procedure: Loop Recorder Insertion;  Surgeon: GrEvans Lance  MD;  Location: [facility];  Service: Cardiovascular;  Laterality: N/A;  . TEE WITHOUT CARDIOVERSION N/A 10/03/2015   Procedure: TRANSESOPHAGEAL ECHOCARDIOGRAM (TEE);  Surgeon: Thayer Headings, MD;  Location: [facility] ENDOSCOPY;  Service: Cardiovascular;  Laterality: N/A;     MEDICATIONS: Current Outpatient Prescriptions on File Prior to Visit  Medication Sig Dispense Refill  . aspirin 325 MG tablet Take 1 tablet (325 mg total) by mouth daily. 30 tablet 0  . citalopram (CELEXA) 20 MG tablet Take 20 mg by mouth daily.    . clonazePAM (KLONOPIN) 0.5 MG tablet Take 0.5 mg by mouth daily as needed for anxiety.     [name] esomeprazole (NEXIUM) 20 MG capsule Take 20 mg by mouth daily as needed (for heartburn or acid reflux).     [name] ibuprofen (ADVIL,MOTRIN) 200 MG tablet Take 400 mg by mouth every 6 (six) hours as needed.     No current facility-administered medications on file prior to visit.     ALLERGIES: Allergies  Allergen Reactions  . Benadryl [Diphenhydramine Hcl] Palpitations and Other (See Comments)    Heartpounding, panic attack  . Imitrex [Sumatriptan] Palpitations and Other (See Comments)    Heartpounding, panic attack  . Atorvastatin Other (See Comments)    Myalgias, got worse over time, was taken off med  . Iodine Other (See Comments)    IVP test problems  . Ivp Dye [Iodinated Diagnostic Agents] Other (See Comments)    IVP test problems  . Lamictal [Lamotrigine] Rash  . Lobster [Shellfish Allergy] Itching  . Metoclopramide Anxiety  . Tetracyclines & Related Rash    Blisters down the throat  . Theophyllines Rash    Target rash    FAMILY HISTORY: Family History  Problem Relation Age of Onset  . Adopted: Yes    SOCIAL HISTORY: Social History   Social History  . Marital status: Married    Spouse name: N/A  . Number of children: N/A  . Years of education: N/A   Occupational History  . Not on file.   Social History Main Topics  . Smoking status: Former Research scientist (life sciences)  . Smokeless tobacco: Never Used  . Alcohol use Yes     Comment: occasionally  . Drug use:     Types: Marijuana     Comment: occasionally  . Sexual activity: Not on file   Other Topics Concern  . Not on file   Social History Narrative   Drinks very little caffeine      REVIEW OF SYSTEMS: Constitutional: No fevers, chills, or sweats, no generalized fatigue, change in appetite Eyes: No visual changes, double vision, eye pain Ear, nose and throat: No hearing loss, ear pain, nasal congestion, sore throat Cardiovascular: No chest pain, palpitations Respiratory:  No shortness of breath at rest or with exertion, wheezes GastrointestinaI: No nausea, vomiting, diarrhea, abdominal pain, fecal incontinence Genitourinary:  No dysuria, urinary retention or frequency Musculoskeletal:  No neck pain, back pain Integumentary: No rash, pruritus, skin lesions Neurological: as above Psychiatric: No depression, insomnia, anxiety Endocrine: No palpitations, fatigue, diaphoresis, mood swings, change in appetite, change in weight, increased thirst Hematologic/Lymphatic:  No purpura, petechiae. Allergic/Immunologic: no itchy/runny eyes, nasal congestion, recent allergic reactions, rashes  PHYSICAL EXAM: Vitals:   04/09/16 1005  BP: 110/68  Pulse: 74   General: No acute distress.  Patient appears well-groomed.  Head:  Normocephalic/atraumatic Eyes:  fundi examined but not visualized Neck: supple, no paraspinal tenderness, full range of motion Back: No paraspinal tenderness Heart: regular rate  and rhythm Lungs: Clear to auscultation bilaterally. Vascular: No carotid bruits. Neurological Exam: Mental status: alert and oriented to person, place, and time, recent and remote memory intact, fund of knowledge intact, attention and concentration intact, speech fluent and not dysarthric, language intact. Cranial nerves: CN I: not tested CN II: pupils equal, round and reactive to light, visual fields intact CN III, IV, VI:  full range of motion, no nystagmus, no ptosis CN V: reduced left V1-V3 sensation CN VII: upper and lower face symmetric CN VIII: hearing intact CN IX, X: gag intact, uvula midline CN XI: sternocleidomastoid and trapezius muscles intact CN XII: tongue  midline Bulk & Tone: normal, no fasciculations. Motor:  5/5 throughout  Sensation:  temperature and vibration sensation intact. . Deep Tendon Reflexes:  2+ throughout,  toes downgoing.  Finger to nose testing:  Without dysmetria.  Heel to shin:  Without dysmetria.  Gait:  Normal station and stride.  Able to turn and tandem walk. Romberg negative.  IMPRESSION: 1.  Cryptogenic stroke 2.  Recurrent spells of stroke-like events.  Unlikely to be cerebrovascular.  Repeated imaging have been negative for acute stroke.  Either complex migraine (but no history of migraines) or anxiety-related.  This may be anxiety related since her anxiety has improved and she hasn't had a spell for over a month.  PLAN: 1.  Continue aspirin 360m daily for secondary stroke prevention 2.  We will start simvastatin 196mdaily, which is a low-potency statin that may less likely cause myalgias.  We will repeat fasting lipid panel in three months. 3.  Follow up in 6 months. 4.  We discussed that if she has one of her habitual spells (numbness and speech difficulty lasting 30 minutes or so), she doesn't need to go to the ED.  However, if she has new symptoms or if symptoms are prolonged (greater than 60 minutes) then she should go to the ED.  Thank you for allowing me to take part in the care of this patient.  AdMetta ClinesDO  CC:  ElLeighton RuffMD

## 2016-04-09 NOTE — Patient Instructions (Signed)
1.  Continue aspirin 325mg  daily for secondary stroke prevention 2.  We will start simvastatin 10mg  daily, which is a low-potency statin that may less likely cause muscle pain.  We will repeat fasting lipid panel in three months. 3.  Follow up in 6 months.

## 2016-04-10 DIAGNOSIS — F4310 Post-traumatic stress disorder, unspecified: Secondary | ICD-10-CM | POA: Diagnosis not present

## 2016-04-11 LAB — CUP PACEART REMOTE DEVICE CHECK
Date Time Interrogation Session: 20171130190554
MDC IDC PG IMPLANT DT: 20170703

## 2016-04-16 DIAGNOSIS — F411 Generalized anxiety disorder: Secondary | ICD-10-CM | POA: Diagnosis not present

## 2016-04-22 ENCOUNTER — Encounter (HOSPITAL_COMMUNITY): Payer: Self-pay

## 2016-04-22 ENCOUNTER — Emergency Department (HOSPITAL_COMMUNITY): Payer: BLUE CROSS/BLUE SHIELD

## 2016-04-22 ENCOUNTER — Emergency Department (HOSPITAL_COMMUNITY)
Admission: EM | Admit: 2016-04-22 | Discharge: 2016-04-22 | Disposition: A | Discharge status: Home / Self Care | Payer: BLUE CROSS/BLUE SHIELD | Attending: Emergency Medicine | Admitting: Emergency Medicine

## 2016-04-22 DIAGNOSIS — Z8673 Personal history of transient ischemic attack (TIA), and cerebral infarction without residual deficits: Secondary | ICD-10-CM | POA: Insufficient documentation

## 2016-04-22 DIAGNOSIS — R200 Anesthesia of skin: Secondary | ICD-10-CM | POA: Diagnosis not present

## 2016-04-22 DIAGNOSIS — Z5181 Encounter for therapeutic drug level monitoring: Secondary | ICD-10-CM | POA: Insufficient documentation

## 2016-04-22 DIAGNOSIS — Z87891 Personal history of nicotine dependence: Secondary | ICD-10-CM | POA: Insufficient documentation

## 2016-04-22 DIAGNOSIS — Z7982 Long term (current) use of aspirin: Secondary | ICD-10-CM | POA: Diagnosis not present

## 2016-04-22 DIAGNOSIS — R202 Paresthesia of skin: Secondary | ICD-10-CM

## 2016-04-22 DIAGNOSIS — J45909 Unspecified asthma, uncomplicated: Secondary | ICD-10-CM | POA: Insufficient documentation

## 2016-04-22 DIAGNOSIS — R220 Localized swelling, mass and lump, head: Secondary | ICD-10-CM | POA: Diagnosis not present

## 2016-04-22 DIAGNOSIS — E119 Type 2 diabetes mellitus without complications: Secondary | ICD-10-CM | POA: Insufficient documentation

## 2016-04-22 LAB — CBC
HCT: 40.5 % (ref 36.0–46.0)
HEMOGLOBIN: 13.7 g/dL (ref 12.0–15.0)
MCH: 31.4 pg (ref 26.0–34.0)
MCHC: 33.8 g/dL (ref 30.0–36.0)
MCV: 92.7 fL (ref 78.0–100.0)
PLATELETS: 232 10*3/uL (ref 150–400)
RBC: 4.37 MIL/uL (ref 3.87–5.11)
RDW: 13.8 % (ref 11.5–15.5)
WBC: 6.3 10*3/uL (ref 4.0–10.5)

## 2016-04-22 LAB — COMPREHENSIVE METABOLIC PANEL
ALBUMIN: 4.2 g/dL (ref 3.5–5.0)
ALK PHOS: 60 U/L (ref 38–126)
ALT: 34 U/L (ref 14–54)
AST: 24 U/L (ref 15–41)
Anion gap: 12 (ref 5–15)
BILIRUBIN TOTAL: 1 mg/dL (ref 0.3–1.2)
BUN: 11 mg/dL (ref 6–20)
CALCIUM: 9.5 mg/dL (ref 8.9–10.3)
CO2: 19 mmol/L — AB (ref 22–32)
CREATININE: 0.77 mg/dL (ref 0.44–1.00)
Chloride: 107 mmol/L (ref 101–111)
GFR calc non Af Amer: >60 mL/min (ref >60)
GLUCOSE: 114 mg/dL — AB (ref 65–99)
Potassium: 4.2 mmol/L (ref 3.5–5.1)
SODIUM: 138 mmol/L (ref 135–145)
TOTAL PROTEIN: 7.1 g/dL (ref 6.5–8.1)

## 2016-04-22 LAB — I-STAT CHEM 8, ED
BUN: 13 mg/dL (ref 6–20)
CALCIUM ION: 1.18 mmol/L (ref 1.15–1.40)
CHLORIDE: 108 mmol/L (ref 101–111)
CREATININE: 0.7 mg/dL (ref 0.44–1.00)
GLUCOSE: 118 mg/dL — AB (ref 65–99)
HCT: 42 % (ref 36.0–46.0)
Hemoglobin: 14.3 g/dL (ref 12.0–15.0)
POTASSIUM: 4.2 mmol/L (ref 3.5–5.1)
Sodium: 140 mmol/L (ref 135–145)
TCO2: 23 mmol/L (ref 0–100)

## 2016-04-22 LAB — DIFFERENTIAL
Basophils Absolute: 0.1 10*3/uL (ref 0.0–0.1)
Basophils Relative: 2 %
EOS PCT: 3 %
Eosinophils Absolute: 0.2 10*3/uL (ref 0.0–0.7)
LYMPHS ABS: 2.1 10*3/uL (ref 0.7–4.0)
LYMPHS PCT: 33 %
MONO ABS: 0.4 10*3/uL (ref 0.1–1.0)
Monocytes Relative: 7 %
NEUTROS ABS: 3.5 10*3/uL (ref 1.7–7.7)
Neutrophils Relative %: 55 %

## 2016-04-22 LAB — APTT: aPTT: 30 seconds (ref 24–36)

## 2016-04-22 LAB — PROTIME-INR
INR: 0.93
PROTHROMBIN TIME: 12.5 s (ref 11.4–15.2)

## 2016-04-22 LAB — I-STAT TROPONIN, ED: Troponin i, poc: 0 ng/mL (ref 0.00–0.08)

## 2016-04-22 LAB — CBG MONITORING, ED: Glucose-Capillary: 108 mg/dL — ABNORMAL HIGH (ref 65–99)

## 2016-04-22 NOTE — ED Provider Notes (Signed)
[facility]-EMERGENCY DEPT Provider Note   CSN: [id number] Arrival date & time: 04/22/16  4098     History   Chief Complaint Chief Complaint  Patient presents with  . Facial Swelling  . Numbness    HPI Jenna Delgado is a 56 y.o. female.  Patient is a 56 year old female with history of diabetes, asthma, prior TIA. She presents for evaluation of left facial numbness and swelling. This began this morning upon waking from sleep. She went to bed last night at approximately 11 PM and felt fine. She reports being somewhat tired lately.   The history is provided by the patient.    Past Medical History:  Diagnosis Date  . Asthma   . Diabetes mellitus without complication (HCC)   . Mild bipolar disorder (HCC)   . Prediabetes   . Seasonal allergic rhinitis   . Stroke (HCC)   . Vitamin D deficiency     Patient Active Problem List   Diagnosis Date Noted  . TIA (transient ischemic attack) 10/28/2015  . Cerebrovascular accident (CVA) (HCC)   . Abnormal LFTs   . Cerebral infarction due to unspecified mechanism   . Acute CVA (cerebrovascular accident) (HCC) 09/29/2015  . Abnormal liver function test 09/29/2015  . Asthma 09/29/2015  . Bipolar disorder (HCC) 09/29/2015  . Hyperlipidemia 09/26/2015  . Stroke (cerebrum) (HCC) 09/25/2015  . Hyperglycemia 09/25/2015  . Acute ischemic stroke [facility])     Past Surgical History:  Procedure Laterality Date  . ABDOMINAL HYSTERECTOMY    . BLADDER SUSPENSION    . EP IMPLANTABLE DEVICE N/A 10/03/2015   Procedure: Loop Recorder Insertion;  Surgeon: Marinus Maw, MD;  Location: [facility] INVASIVE CV LAB;  Service: Cardiovascular;  Laterality: N/A;  . TEE WITHOUT CARDIOVERSION N/A 10/03/2015   Procedure: TRANSESOPHAGEAL ECHOCARDIOGRAM (TEE);  Surgeon: Vesta Mixer, MD;  Location: [facility] ENDOSCOPY;  Service: Cardiovascular;  Laterality: N/A;    OB History    No data available       Home Medications    Prior to Admission medications   Medication  Sig Start Date End Date Taking? Authorizing Provider  aspirin 325 MG tablet Take 1 tablet (325 mg total) by mouth daily. 09/27/15   Catarina Hartshorn, MD  citalopram (CELEXA) 20 MG tablet Take 20 mg by mouth daily.    Historical Provider, MD  clonazePAM (KLONOPIN) 0.5 MG tablet Take 0.5 mg by mouth daily as needed for anxiety.  06/24/15   Historical Provider, MD  esomeprazole (NEXIUM) 20 MG capsule Take 20 mg by mouth daily as needed (for heartburn or acid reflux).  06/24/15   Historical Provider, MD  ibuprofen (ADVIL,MOTRIN) 200 MG tablet Take 400 mg by mouth every 6 (six) hours as needed.    Historical Provider, MD  simvastatin (ZOCOR) 10 MG tablet Take 1 tablet (10 mg total) by mouth daily. 04/09/16   Drema Dallas, DO    Family History Family History  Problem Relation Age of Onset  . Adopted: Yes    Social History Social History  Substance Use Topics  . Smoking status: Former Games developer  . Smokeless tobacco: Never Used  . Alcohol use Yes     Comment: occasionally     Allergies   Benadryl [diphenhydramine hcl]; Imitrex [sumatriptan]; Atorvastatin; Iodine; Ivp dye [iodinated diagnostic agents]; Lamictal [lamotrigine]; Lobster [shellfish allergy]; Metoclopramide; Tetracyclines & related; and Theophyllines   Review of Systems Review of Systems  All other systems reviewed and are negative.    Physical Exam Updated Vital Signs  BP 121/77 (BP Location: Left Arm)   Pulse 86   Temp 98.4 F (36.9 C) (Oral)   Resp 14   Ht 5' (1.524 m)   Wt 180 lb (81.6 kg)   SpO2 97%   BMI 35.15 kg/m   Physical Exam  Constitutional: She is oriented to person, place, and time. She appears well-developed and well-nourished. No distress.  HENT:  Head: Normocephalic and atraumatic.  There is a slight amount of swelling to the soft tissues below the left eye and left eyelid. The eyeball itself appears normal.  Neck: Normal range of motion. Neck supple.  Cardiovascular: Normal rate and regular rhythm.  Exam  reveals no gallop and no friction rub.   No murmur heard. Pulmonary/Chest: Effort normal and breath sounds normal. No respiratory distress. She has no wheezes.  Abdominal: Soft. Bowel sounds are normal. She exhibits no distension. There is no tenderness.  Musculoskeletal: Normal range of motion.  Neurological: She is alert and oriented to person, place, and time. No cranial nerve deficit. She exhibits normal muscle tone. Coordination normal.  Skin: Skin is warm and dry. She is not diaphoretic.  Nursing note and vitals reviewed.    ED Treatments / Results  Labs (all labs ordered are listed, but only abnormal results are displayed) Labs Reviewed  COMPREHENSIVE METABOLIC PANEL - Abnormal; Notable for the following:       Result Value   CO2 19 (*)    Glucose, Bld 114 (*)    All other components within normal limits  CBG MONITORING, ED - Abnormal; Notable for the following:    Glucose-Capillary 108 (*)    All other components within normal limits  I-STAT CHEM 8, ED - Abnormal; Notable for the following:    Glucose, Bld 118 (*)    All other components within normal limits  PROTIME-INR  APTT  CBC  DIFFERENTIAL  I-STAT TROPOININ, ED    EKG  EKG Interpretation None       Radiology No results found.  Procedures Procedures (including critical care time)  Medications Ordered in ED Medications - No data to display   Initial Impression / Assessment and Plan / ED Course  I have reviewed the triage vital signs and the nursing notes.  Pertinent labs & imaging results that were available during my care of the patient were reviewed by me and considered in my medical decision making (see chart for details).     Patient referred here for evaluation of left facial numbness and swelling. This started this morning when she woke from sleep. She was seen at her primary doctor's office, then sent here for rule out of stroke. The patient has no involvement of her arm or leg, and no other  symptoms that would be concerning for stroke. Her head CT is negative. She does have some swelling to the soft tissues below her left eye. This appears to be allergic in nature. She is having no eye pain and has free range of motion of the eyeball. She will be discharged with Benadryl and when necessary return.  Final Clinical Impressions(s) / ED Diagnoses   Final diagnoses:  None    New Prescriptions New Prescriptions   No medications on file     Geoffery Lyonsouglas Treasure Ingrum, MD 04/22/16 1133

## 2016-04-22 NOTE — Discharge Instructions (Signed)
Benadryl 50 mg every 8 hours as needed for swelling.  Return to the emergency department if you develop headache, weakness in your arm or leg, facial droop, difficulty speaking, or other new and concerning symptoms.

## 2016-04-22 NOTE — ED Notes (Signed)
MD Delo at the bedside.  

## 2016-04-22 NOTE — ED Triage Notes (Signed)
Pt. Reports Friday night she began feeling "off" and having a difficult time communicating.  Saturday she felt better, but continued to have difficulty finding her words.  She woke up this am with Lt. . Face numbness.and she has welts under her lt. Eye.  She denies any chest pain headache sob,. N/v/d   She has a hx of migraines without pain and also hx. Of a stroke,.  No facial droop noted. speech is clear, Moving all extremities.  Pt. Is alert and oriented X4, skin is p/w/d

## 2016-04-22 NOTE — ED Notes (Signed)
Pt returns from ct scan. 

## 2016-04-22 NOTE — ED Notes (Signed)
Patient transported to X-ray 

## 2016-04-23 DIAGNOSIS — F411 Generalized anxiety disorder: Secondary | ICD-10-CM | POA: Diagnosis not present

## 2016-04-25 DIAGNOSIS — F4310 Post-traumatic stress disorder, unspecified: Secondary | ICD-10-CM | POA: Diagnosis not present

## 2016-04-30 ENCOUNTER — Ambulatory Visit (INDEPENDENT_AMBULATORY_CARE_PROVIDER_SITE_OTHER): Payer: BLUE CROSS/BLUE SHIELD | Admitting: *Deleted

## 2016-04-30 DIAGNOSIS — I639 Cerebral infarction, unspecified: Secondary | ICD-10-CM | POA: Diagnosis not present

## 2016-04-30 DIAGNOSIS — F411 Generalized anxiety disorder: Secondary | ICD-10-CM | POA: Diagnosis not present

## 2016-05-01 NOTE — Progress Notes (Signed)
Carelink Summary Report / Loop Recorder 

## 2016-05-07 ENCOUNTER — Ambulatory Visit: Payer: BLUE CROSS/BLUE SHIELD | Admitting: Neurology

## 2016-05-10 DIAGNOSIS — F4310 Post-traumatic stress disorder, unspecified: Secondary | ICD-10-CM | POA: Diagnosis not present

## 2016-05-14 ENCOUNTER — Telehealth: Payer: Self-pay | Admitting: Neurology

## 2016-05-14 DIAGNOSIS — F411 Generalized anxiety disorder: Secondary | ICD-10-CM | POA: Diagnosis not present

## 2016-05-14 NOTE — Telephone Encounter (Signed)
Jenna Delgado February 07, 2061. Her # [phone] [id number]. She is having really bad cognitive  side effects from her medication. She would like you to please call her. She is scheduled for 05/23/16.Thank you

## 2016-05-14 NOTE — Telephone Encounter (Signed)
Spoke to patient. Gave instructions per Dr. Jaffe's previous note. Patient verbalized understanding.  

## 2016-05-14 NOTE — Telephone Encounter (Signed)
This doesn't sound like side effects to simvastatin, however she should contact her PCP regarding alternative statin if needed.  I think symptoms likely related to anxiety and depression.

## 2016-05-14 NOTE — Telephone Encounter (Signed)
Patient called in reference to simvastatin (ZOCOR) 10 MG tablet and believes to had been having reaction to medication.  Patient notified to contact [facility] being their office is the one who prescribed medication.  Per patient she had switched to [facility] due to trouble reaching our office via phones.  Patient was notified of appointment with Dr. Pearlean BrownieSethi on 05/28/16 and advised patient she can't continue to see two Neurologist.  Patient never advised myself to cancel appointment (per patient she will see about switching records back to our clinic after our phone system is fixed)

## 2016-05-14 NOTE — Telephone Encounter (Signed)
Patient states she is experiencing extreme confusion. x1 week. Says she has been 'loosing time". Unable to explain where "hours" or "days" have went.   Unable to read & comprehend at times.  Patient thinks all symptoms are related to start of simvastatin 10mg  on 04/09/16.  Says she did not feel this way before taking med. Patient was very upset (crying) and kept saying "she does not feel like herself."

## 2016-05-15 DIAGNOSIS — R413 Other amnesia: Secondary | ICD-10-CM | POA: Diagnosis not present

## 2016-05-15 DIAGNOSIS — I639 Cerebral infarction, unspecified: Secondary | ICD-10-CM | POA: Diagnosis not present

## 2016-05-15 NOTE — Telephone Encounter (Signed)
Ok  With above plan

## 2016-05-16 DIAGNOSIS — F4310 Post-traumatic stress disorder, unspecified: Secondary | ICD-10-CM | POA: Diagnosis not present

## 2016-05-19 LAB — CUP PACEART REMOTE DEVICE CHECK
Implantable Pulse Generator Implant Date: 20170703
MDC IDC SESS DTM: 20171230193558

## 2016-05-19 NOTE — Progress Notes (Signed)
Carelink summary report received. Battery status OK. Normal device function. No new symptom episodes, tachy episodes, or brady episodes. No new AF episodes. 1 pause- 3 sec's @ 1959 03/06/16, no symptoms reported in EPIC. Monthly summary reports and ROV/PRN

## 2016-05-23 ENCOUNTER — Ambulatory Visit: Payer: BLUE CROSS/BLUE SHIELD | Admitting: Neurology

## 2016-05-28 ENCOUNTER — Encounter: Payer: Self-pay | Admitting: Neurology

## 2016-05-28 ENCOUNTER — Ambulatory Visit (INDEPENDENT_AMBULATORY_CARE_PROVIDER_SITE_OTHER): Payer: BLUE CROSS/BLUE SHIELD | Admitting: Neurology

## 2016-05-28 VITALS — BP 140/75 | HR 90 | Wt 186.4 lb

## 2016-05-28 DIAGNOSIS — F411 Generalized anxiety disorder: Secondary | ICD-10-CM | POA: Diagnosis not present

## 2016-05-28 DIAGNOSIS — I639 Cerebral infarction, unspecified: Secondary | ICD-10-CM

## 2016-05-28 DIAGNOSIS — R2990 Unspecified symptoms and signs involving the nervous system: Secondary | ICD-10-CM

## 2016-05-28 NOTE — Patient Instructions (Addendum)
I had a long d/w patient about her embolic stroke,stroke like episodes, anxiety, risk for recurrent stroke/TIAs, personally independently reviewed imaging studies and stroke evaluation results and answered questions.Continue aspirin 325 mg daily  for secondary stroke prevention and maintain strict control of hypertension with blood pressure goal below 130/90, diabetes with hemoglobin A1c goal below 6.5% and lipids with LDL cholesterol goal below 70 mg/dL. I also advised the patient to eat a healthy diet with plenty of whole grains, cereals, fruits and vegetables, exercise regularly and maintain ideal body weight. She was encouraged to increase participation in stress relaxation activities like meditation, yoga and regular exercise. Followup in the future with me only as needed.   Fat and Cholesterol Restricted Diet Introduction Getting too much fat and cholesterol in your diet may cause health problems. Following this diet helps keep your fat and cholesterol at normal levels. This can keep you from getting sick. What types of fat should I choose?  Choose monosaturated and polyunsaturated fats. These are found in foods such as olive oil, canola oil, flaxseeds, walnuts, almonds, and seeds.  Eat more omega-3 fats. Good choices include salmon, mackerel, sardines, tuna, flaxseed oil, and ground flaxseeds.  Limit saturated fats. These are in animal products such as meats, butter, and cream. They can also be in plant products such as palm oil, palm kernel oil, and coconut oil.  Avoid foods with partially hydrogenated oils in them. These contain trans fats. Examples of foods that have trans fats are stick margarine, some tub margarines, cookies, crackers, and other baked goods. What general guidelines do I need to follow?  Check food labels. Look for the words "trans fat" and "saturated fat."  When preparing a meal:  Fill half of your plate with vegetables and green salads.  Fill one fourth of your  plate with whole grains. Look for the word "whole" as the first word in the ingredient list.  Fill one fourth of your plate with lean protein foods.  Eat more foods that have fiber, like apples, carrots, beans, peas, and barley.  Eat more home-cooked foods. Eat less at restaurants and buffets.  Limit or avoid alcohol.  Limit foods high in starch and sugar.  Limit fried foods.  Cook foods without frying them. Baking, boiling, grilling, and broiling are all great options.  Lose weight if you are overweight. Losing even a small amount of weight can help your overall health. It can also help prevent diseases such as diabetes and heart disease. What foods can I eat? Grains  Whole grains, such as whole wheat or whole grain breads, crackers, cereals, and pasta. Unsweetened oatmeal, bulgur, barley, quinoa, or brown rice. Corn or whole wheat flour tortillas. Vegetables  Fresh or frozen vegetables (raw, steamed, roasted, or grilled). Green salads. Fruits  All fresh, canned (in natural juice), or frozen fruits. Meat and Other Protein Products  Ground beef (85% or leaner), grass-fed beef, or beef trimmed of fat. Skinless chicken or [place]. Ground chicken or [place]. Pork trimmed of fat. All fish and seafood. Eggs. Dried beans, peas, or lentils. Unsalted nuts or seeds. Unsalted canned or dry beans. Dairy  Low-fat dairy products, such as skim or 1% milk, 2% or reduced-fat cheeses, low-fat ricotta or cottage cheese, or plain low-fat yogurt. Fats and Oils  Tub margarines without trans fats. Light or reduced-fat mayonnaise and salad dressings. Avocado. Olive, canola, sesame, or safflower oils. Natural peanut or almond butter (choose ones without added sugar and oil). The items listed above may not  be a complete list of recommended foods or beverages. Contact your dietitian for more options.  What foods are not recommended? Grains  White bread. White pasta. White rice. Cornbread. Bagels, pastries, and  croissants. Crackers that contain trans fat. Vegetables  White potatoes. Corn. Creamed or fried vegetables. Vegetables in a cheese sauce. Fruits  Dried fruits. Canned fruit in light or heavy syrup. Fruit juice. Meat and Other Protein Products  Fatty cuts of meat. Ribs, chicken wings, bacon, sausage, bologna, salami, chitterlings, fatback, hot dogs, bratwurst, and packaged luncheon meats. Liver and organ meats. Dairy  Whole or 2% milk, cream, half-and-half, and cream cheese. Whole milk cheeses. Whole-fat or sweetened yogurt. Full-fat cheeses. Nondairy creamers and whipped toppings. Processed cheese, cheese spreads, or cheese curds. Sweets and Desserts  Corn syrup, sugars, honey, and molasses. Candy. Jam and jelly. Syrup. Sweetened cereals. Cookies, pies, cakes, donuts, muffins, and ice cream. Fats and Oils  Butter, stick margarine, lard, shortening, ghee, or bacon fat. Coconut, palm kernel, or palm oils. Beverages  Alcohol. Sweetened drinks (such as sodas, lemonade, and fruit drinks or punches). The items listed above may not be a complete list of foods and beverages to avoid. Contact your dietitian for more information.  This information is not intended to replace advice given to you by your health care provider. Make sure you discuss any questions you have with your health care provider. Document Released: 09/18/2011 Document Revised: 11/24/2015 Document Reviewed: 06/18/2013  2017 Elsevier

## 2016-05-28 NOTE — Progress Notes (Signed)
[facility] [address] [place]. [place] [address] [phone]       OFFICE FOLLOW-UP NOTE  [name] Date of Birth:  1960-04-29 Medical Record Number:  [id number]   HPI:  Initial visit 11/17/2015 ; 37 year Caucasian lady seen today for the first office follow-up visit following hospital admission for stroke in June 2017 and TIA in July 2017.Jenna Delgado is a 56 y.o. female who had not yet gone to sleep around 2 AM 09/25/2015 when she started noticing that her left side felt funny. She states that it didn't feel numb exactly, but that he just didn't seem to do what it was supposed to do. He thought that she might have been laying on it funny. She went to sleep and when she awoke it was still like that and therefore she presented to the emergency room today. He states that it feels like a static deficit. She denies other symptoms but does state that she has been under a lot of stress. She has had a lot of flying over the past few weeks. Patient was not administered IV t-PA secondary to delay in arrival. She was admitted  for further evaluation and treatment. Transthoraxic echo showed normal ejection fraction. Transesophageal echo also showed no cardiac source of embolism, PFO. Lower extremity venous Dopplers were negative for DVT. Carotid Doppler showed no significant extracranial stenosis. Hypercoagulable panel labs were negative. ANA and ESR were normal. Hemoglobin A1c was 5.7. LDL cholesterol was elevated at 124 mg percent. Patient was started on aspirin and Lipitor for stroke prevention. She returned a few days later with recurrent symptoms of worsening left-sided deficits. This time repeat MRI scan of the brain obtained on 09/29/15 as well as on 7/99/17 showed no acute infarct and only evolutionary changes in the right frontal infarcts. Patient had a loop recorder inserted and so for paroxysmal atrial fibrillation has not yet been found. Patient stated that she  possibly could have had on the TIA for a few days ago. She woke up in the middle of the [place]. She felt a feeling of ice water on the right side last only 5 minutes and recovered completely. She did not seek medical help. Today she states she did not have a good day she feels weak and tired. A blood pressure is running 100/67 but she states that this usually is low. She is distorting aspirin without bleeding but does get easy bruising. She states Lipitor is causing some hip pain. She does snore and does not get good sleep and husband feels she may have sleep apnea. Update 05/28/2016 ; She returns for follow-up after last visit with me 6 months ago. She states she was unhappy with the practice as "" nobody listened to her``` and she saw Dr. Tomi Likens at about neurology once. She has been seen in the ER on 12/12/15 as well as 02/07/16 as well as more recently 04/22/16 for what she felt were TIAs however she had negative MRI scans of the brain in September and November 2017 and have personally reviewed the films. Dr. Tomi Likens felt his episodes may be related to come to get a migraine anxiety. The patient denies any long-standing history suggestive of migraines or cognitive migraine. She does admit to having increased anxiety since his episodes. She complains of decreased concentration and thinking and all these began after her stroke. She however feels her symptoms may have been related to cholesterol medication that she was  taking. After she stopped the cholesterol medication she felt a lot better and could work and concentrate much better. Her primary care physician switched her to an alternative cholesterol medicine but she developed symptoms from that as well and discontinued it as well. She is currently not on any cholesterol medication in fact have stopped all medications except aspirin. She takes Klonopin only sparingly when necessary. She is in fact seeing the physician for integrative  medicine for the last few months to help with managing her anxiety. She wants to be on a low-fat diet and avoid cholesterol medications ROS:   14 system review of systems is positive for  memory loss, headache, numbness, speech difficulty, weakness, confusion, decreased concentration and all other systems negative PMH:  Past Medical History:  Diagnosis Date  . Asthma   . Mild bipolar disorder ([place])   . Prediabetes   . Seasonal allergic rhinitis   . Stroke ([place])   . Vitamin D deficiency     Social History:  Social History   Social History  . Marital status: Married    Spouse name: N/A  . Number of children: N/A  . Years of education: N/A   Occupational History  . Not on file.   Social History Main Topics  . Smoking status: Former Research scientist (life sciences)  . Smokeless tobacco: Never Used  . Alcohol use Yes     Comment: occasionally  . Drug use: Yes    Types: Marijuana     Comment: occasionally  . Sexual activity: Not on file   Other Topics Concern  . Not on file   Social History Narrative   Drinks very little caffeine     Medications:   Current Outpatient Prescriptions on File Prior to Visit  Medication Sig Dispense Refill  . aspirin 325 MG tablet Take 1 tablet (325 mg total) by mouth daily. 30 tablet 0  . clonazePAM (KLONOPIN) 0.5 MG tablet Take 0.5 mg by mouth daily as needed for anxiety.      No current facility-administered medications on file prior to visit.     Allergies:   Allergies  Allergen Reactions  . Benadryl [Diphenhydramine Hcl] Palpitations and Other (See Comments)    Heartpounding, panic attack  . Imitrex [Sumatriptan] Palpitations and Other (See Comments)    Heartpounding, panic attack  . Atorvastatin Other (See Comments)    Myalgias, got worse over time, was taken off med  . Iodine Other (See Comments)    IVP test problems  . Ivp Dye [Iodinated Diagnostic Agents] Other (See Comments)    IVP test problems  . Crestor [Rosuvastatin Calcium]     Memory  issues  . Zocor [Simvastatin]     memory  . Lamictal [Lamotrigine] Rash  . Lobster [Shellfish Allergy] Itching  . Metoclopramide Anxiety  . Tetracyclines & Related Rash    Blisters down the throat  . Theophyllines Rash    Target rash    Physical Exam General: well developed, well nourished Anxious looking middle-age Caucasian lady, seated, in no evident distress Head: head normocephalic and atraumatic.  Neck: supple with no carotid or supraclavicular bruits Cardiovascular: regular rate and rhythm, no murmurs Musculoskeletal: no deformity Skin:  no rash/petichiae Vascular:  Normal pulses all extremities Vitals:   05/28/16 1051  BP: 140/75  Pulse: 90   Neurologic Exam Mental Status: Awake and fully alert. Oriented to place and time. Recent and remote memory intact. Attention span, concentration and fund of knowledge appropriate. Mood and affect appropriate.  Cranial Nerves: Fundoscopic  exam Not done. Pupils equal, briskly reactive to light. Extraocular movements full without nystagmus. Visual fields full to confrontation. Hearing intact. Facial sensation intact. Face, tongue, palate moves normally and symmetrically.  Motor: Normal bulk and tone. Mild left hemiparesis 4/5 with weakness of left grip and intrinsic hand muscles. Left hip  Ankle dorsiflexors mildly weak. Sensory.: intact to touch ,pinprick .position and vibratory sensation.  Coordination: Rapid alternating movements normal in all extremities. Finger-to-nose and heel-to-shin performed mild ataxia on left sidely. Gait and Station: Arises from chair without difficulty. Stance is normal. Gait demonstrates normal stride length and balance . Drives left feet when walking. Unable   to heel, toe and tandem walk without difficulty.  Reflexes: 1+ and symmetric. Toes downgoing.       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 Recurrent  TIA in July 2017. Vascular risk factors of hyperlipidemia only. Recurrent strokelike episodes but with negative brain imaging raising concern for possible underlying anxiety contributing    PLAN: I had a long d/w patient about her embolic stroke,stroke like episodes, anxiety, risk for recurrent stroke/TIAs, personally independently reviewed imaging studies and stroke evaluation results and answered questions.Continue aspirin 325 mg daily  for secondary stroke prevention and maintain strict control of hypertension with blood pressure goal below 130/90, diabetes with hemoglobin A1c goal below 6.5% and lipids with LDL cholesterol goal below 70 mg/dL. I also advised the patient to eat a healthy diet with plenty of whole grains, cereals, fruits and vegetables, exercise regularly and maintain ideal body weight. She was encouraged to increase participation in stress relaxation activities like meditation, yoga and regular exercise. Followup in the future with me only as needed. Greater than 50% of time during this 35 minute visit was spent on counseling,explanation of diagnosis, planning of further management, discussion with patient and family and coordination of care Antony Contras, MD  [facility] [address] [place], [place] [address]  Phone [phone] Fax [phone] Note: This document was prepared with digital dictation and possible smart phrase technology. Any transcriptional errors that result from this process are unintentional

## 2016-05-29 LAB — CUP PACEART REMOTE DEVICE CHECK
Date Time Interrogation Session: 20180129225121
MDC IDC PG IMPLANT DT: 20170703

## 2016-05-29 NOTE — Progress Notes (Signed)
Carelink summary report received. Battery status OK. Normal device function. No new symptom episodes, tachy episodes, brady, or pause episodes. No new AF episodes. Monthly summary reports and ROV/PRN 

## 2016-05-30 ENCOUNTER — Ambulatory Visit (INDEPENDENT_AMBULATORY_CARE_PROVIDER_SITE_OTHER): Payer: BLUE CROSS/BLUE SHIELD | Admitting: *Deleted

## 2016-05-30 DIAGNOSIS — I639 Cerebral infarction, unspecified: Secondary | ICD-10-CM | POA: Diagnosis not present

## 2016-05-30 DIAGNOSIS — F4310 Post-traumatic stress disorder, unspecified: Secondary | ICD-10-CM | POA: Diagnosis not present

## 2016-05-31 NOTE — Progress Notes (Signed)
Carelink Summary Report / Loop Recorder 

## 2016-06-15 LAB — CUP PACEART REMOTE DEVICE CHECK
MDC IDC PG IMPLANT DT: 20170703
MDC IDC SESS DTM: 20180228211108

## 2016-06-21 ENCOUNTER — Telehealth: Payer: Self-pay | Admitting: Cardiology

## 2016-06-21 NOTE — Telephone Encounter (Signed)
Spoke w/ pt and requested that she send a manual transmission b/c her home monitor has not updated in at least 14 days.   

## 2016-06-28 DIAGNOSIS — R05 Cough: Secondary | ICD-10-CM | POA: Diagnosis not present

## 2016-06-28 DIAGNOSIS — R8290 Unspecified abnormal findings in urine: Secondary | ICD-10-CM | POA: Diagnosis not present

## 2016-06-28 DIAGNOSIS — J069 Acute upper respiratory infection, unspecified: Secondary | ICD-10-CM | POA: Diagnosis not present

## 2016-06-29 ENCOUNTER — Ambulatory Visit (INDEPENDENT_AMBULATORY_CARE_PROVIDER_SITE_OTHER): Payer: BLUE CROSS/BLUE SHIELD | Admitting: *Deleted

## 2016-06-29 DIAGNOSIS — I639 Cerebral infarction, unspecified: Secondary | ICD-10-CM | POA: Diagnosis not present

## 2016-07-02 DIAGNOSIS — F411 Generalized anxiety disorder: Secondary | ICD-10-CM | POA: Diagnosis not present

## 2016-07-02 NOTE — Progress Notes (Signed)
Carelink Summary Report / Loop Recorder 

## 2016-07-12 DIAGNOSIS — F4310 Post-traumatic stress disorder, unspecified: Secondary | ICD-10-CM | POA: Diagnosis not present

## 2016-07-12 LAB — CUP PACEART REMOTE DEVICE CHECK
Date Time Interrogation Session: 20180330213823
Implantable Pulse Generator Implant Date: 20170703

## 2016-07-16 DIAGNOSIS — F331 Major depressive disorder, recurrent, moderate: Secondary | ICD-10-CM | POA: Diagnosis not present

## 2016-07-16 DIAGNOSIS — F411 Generalized anxiety disorder: Secondary | ICD-10-CM | POA: Diagnosis not present

## 2016-07-23 ENCOUNTER — Encounter: Payer: Self-pay | Admitting: Neurology

## 2016-07-23 ENCOUNTER — Ambulatory Visit (INDEPENDENT_AMBULATORY_CARE_PROVIDER_SITE_OTHER): Payer: BLUE CROSS/BLUE SHIELD | Admitting: Neurology

## 2016-07-23 VITALS — BP 136/81 | HR 73 | Ht 64.0 in | Wt 194.0 lb

## 2016-07-23 DIAGNOSIS — G4761 Periodic limb movement disorder: Secondary | ICD-10-CM | POA: Diagnosis not present

## 2016-07-23 DIAGNOSIS — G2581 Restless legs syndrome: Secondary | ICD-10-CM | POA: Diagnosis not present

## 2016-07-23 NOTE — Patient Instructions (Addendum)
For your PLMs, leg movements in sleep we don't have to put you on yet another medication, but you are welcome to try the Mirapex:   Mirapex (generic name: pramipexole) 0.125 mg: Take 1 pill each night for 1 week, the 2 pills each night for 1 week, then 3 pills each night thereafter. Common side effects reported are: Sedation, sleepiness, nausea, vomiting, and rare side effects are confusion, hallucinations, swelling in legs, and abnormal behaviors, including impulse control problems, which can manifest as excessive eating, obsessions with food or gambling, or hypersexuality.  We can see you in 3 months nurse practitioners as you are stable.

## 2016-07-23 NOTE — Progress Notes (Signed)
Subjective:    Patient ID: Jenna Delgado is a 56 y.o. female.  HPI     Interim history:  Jenna Delgado is a 56 year old right-handed woman with an underlying medical history of diabetes, asthma, allergic rhinitis, vitamin D deficiency, mood disorder, and stroke in June 2017 and TIA in July 2017, as well as obesity, who presents for follow-up consultation of her sleep disturbance including restless leg symptoms and PLMS. The patient is unaccompanied today. I last saw her on 03/21/2016 after her recent sleep study at which time we talked about her test results. She did report a long-standing history of RLS and leg twitching at night, particularly bothersome also to her husband. She was advised to start a trial of generic Mirapex with gradual titration. We also did lab testing for iron studies and called her with the results. She had gone to the emergency room in the interim twice for complicated migraines. She had a brain MRI without contrast on 12/12/2015:  1. No evidence of acute/early subacute infarct, focal mass effect, or intracranial hemorrhage. 2. Small now chronic right posterior frontal infarct. 3. Mild paranasal sinus disease improved from prior MR.   She had a repeat brain MRI without contrast on 02/07/2016: No evidence of acute/ early subacute infarct, intracranial hemorrhage, or focal mass effect. Mild improvement of paranasal sinus disease. Otherwise stable from 12/12/2015.    Today, 07/23/2016: She reports that she never started the Mirapex as she is not bothered by restless leg symptoms. She had a recent episode of confusion while driving. She has not made a follow-up appointment with Dr. Leonie Man, was last seen on 05/28/2016 and I reviewed the note. She was told she could follow-up as needed. She also reports an episode of visual disturbance. She has not seen her ophthalmologist as yet. Her husband does report that she twitches her legs or kicks her legs while asleep. She endorses  nonrestorative sleep and worries about not getting enough deep sleep. We reviewed her sleep study results again today. She currently is not bothered by restless leg symptoms but would be willing to try medication for PLMS. We also did blood work last time. She reports having had chromosomal analysis through [facility] and me and tests positive for APOE 4 allele and wonders about the implication of this.  The patient's allergies, current medications, family history, past medical history, past social history, past surgical history and problem list were reviewed and updated as appropriate.   Previously:  I first met her on 12/01/2015 at the request of Dr. Leonie Man, at which time she reported snoring and excessive daytime somnolence. I invited her for sleep study. She had a baseline sleep study on 12/27/2015. Sleep latency was markedly prolonged at 212 minutes. She had a decreased sleep efficiency at 59.9%. She had a prolonged REM latency of 223.5 minutes. REM percentage was reduced at 9.4%, she had a mildly increased percentage of stage II sleep and an increased percentage of stage I sleep at 12%. Overall AHI was 3.8 per hour, REM AHI was 26.7 per hour, supine AHI was mildly elevated at 7.9 per hour. She had mild to moderate snoring. She had severe periodic limb movements with an index of 98.8, associated with 18.8 arousals per hour. Average oxygen saturation was 96%, nadir was 88%.   12/01/2015: She reports snoring and excessive daytime somnolence. I reviewed your office note from 11/17/2015. She has trouble initiating and maintaining sleep, she reports restless leg symptoms and has had these for years. She also  moves her legs and her sleep and this disturbs her husband sometimes including her snoring, so often they do not sleep in the same bed. He also snores. Since her stroke, she has had more fatigue and gets sleepy more quickly. Her Epworth sleepiness score is 10 out of 24 today, her fatigue score is 29 out of 63,  bedtime varies, between 9 and 11 PM usually, wakeup time around 8 or 9 AM. 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 She has residual left-sided weakness particularly in her left hand since her stroke. She quit smoking over 15 years ago, drinks alcohol occasionally, caffeine occasionally. Her husband has noted occasional apneic pauses in her sleep but is not fully sure she states. She does not know her family history. She was adopted.    His Past Medical History Is Significant For: Past Medical History:  Diagnosis Date  . Asthma   . Mild bipolar disorder ([place])   . Prediabetes   . Seasonal allergic rhinitis   . Stroke ([place])   . Vitamin D deficiency     His Past Surgical History Is Significant For: Past Surgical History:  Procedure Laterality Date  . ABDOMINAL HYSTERECTOMY    . BLADDER SUSPENSION    . EP IMPLANTABLE DEVICE N/A 10/03/2015   Procedure: Loop Recorder Insertion;  Surgeon: Evans Lance, MD;  Location: [place] [facility];  Service: Cardiovascular;  Laterality: N/A;  . TEE WITHOUT CARDIOVERSION N/A 10/03/2015   Procedure: TRANSESOPHAGEAL ECHOCARDIOGRAM (TEE);  Surgeon: Thayer Headings, MD;  Location: [facility];  Service: Cardiovascular;  Laterality: N/A;    His Family History Is Significant For: Family History  Problem Relation Age of Onset  . Adopted: Yes    His Social History Is Significant For: Social History   Social History  . Marital status: Married    Spouse name: N/A  . Number of children: N/A  . Years of education: N/A   Social History Main Topics  . Smoking status: Former Research scientist (life sciences)  . Smokeless tobacco: Never Used  . Alcohol use Yes     Comment: occasionally  . Drug use: Yes    Types: Marijuana     Comment: occasionally  . Sexual activity: Not Asked   Other Topics Concern  . None   Social History Narrative   Drinks very little caffeine     His Allergies Are:  Allergies  Allergen Reactions  . Benadryl [Diphenhydramine Hcl] Palpitations and Other (See  Comments)    Heartpounding, panic attack  . Imitrex [Sumatriptan] Palpitations and Other (See Comments)    Heartpounding, panic attack  . Atorvastatin Other (See Comments)    Myalgias, got worse over time, was taken off med  . Iodine Other (See Comments)    IVP test problems  . Ivp Dye [Iodinated Diagnostic Agents] Other (See Comments)    IVP test problems  . Crestor [Rosuvastatin Calcium]     Memory issues  . Zocor [Simvastatin]     memory  . Lamictal [Lamotrigine] Rash  . Lobster [Shellfish Allergy] Itching  . Metoclopramide Anxiety  . Tetracyclines & Related Rash    Blisters down the throat  . Theophyllines Rash    Target rash  :   His Current Medications Are:  Outpatient Encounter Prescriptions as of 07/23/2016  Medication Sig  . aspirin 325 MG tablet Take 1 tablet (325 mg total) by mouth daily.  . citalopram (CELEXA) 20 MG tablet Take 20 mg by mouth daily.  . [DISCONTINUED] clonazePAM (KLONOPIN) 0.5 MG  tablet Take 0.5 mg by mouth daily as needed for anxiety.    No facility-administered encounter medications on file as of 07/23/2016.   :  Review of Systems:  Out of a complete 14 point review of systems, all are reviewed and negative with the exception of these symptoms as listed below:  Review of Systems  Neurological:       Pt presents today to follow up on her RLS and stroke. Pt says that she is not bothered by her RLS. Pt says that she tested positive for the APOE-4 gene which may explain why she had a stroke.    Objective:  Neurologic Exam  Physical Exam Physical Examination:   Vitals:   07/23/16 0829  BP: 136/81  Pulse: 73    General Examination: The patient is a very pleasant 56 y.o. female in no acute distress. She appears well-developed and well-nourished and well groomed.   HEENT: Normocephalic, atraumatic, pupils are equal, round and reactive to light and accommodation. Mild lazy eye bilaterally. She has corrective eyeglasses. Extraocular tracking is  good, no nystagmus, normal smooth pursuit. Hearing is intact. Face is symmetric. Airway examination reveals mild mouth dryness and otherwise adequate dental hygiene, no significant airway crowding. Tonsils are small, Mallampati class II. Tongue and palate are symmetrical.   Chest: Clear to auscultation without wheezing, rhonchi or crackles noted.  Heart: No abn.   Abdomen: no abn.   Extremities: There is no pitting edema in the distal lower extremities bilaterally. Pedal pulses are intact.  Skin: Warm and dry without trophic changes noted.  Musculoskeletal: exam reveals no obvious joint deformities, tenderness or joint swelling or erythema.   Neurologically:  Mental status: The patient is awake, alert and oriented in all 4 spheres. Her immediate and remote memory, attention, language skills and fund of knowledge are appropriate. There is no evidence of aphasia, agnosia, apraxia or anomia. Speech is clear with normal prosody and enunciation. Thought process is linear. Mood is normal and affect is normal.  Cranial nerves II - XII are as described above under HEENT exam. In addition: shoulder shrug is normal with equal shoulder height noted. Motor exam: Normal bulk, strength and tone is noted. Romberg is negative. Fine motor skills and coordination: intact.  Cerebellar testing: No dysmetria or intention tremor on finger to nose testing. There is no truncal or gait ataxia.  Sensory exam: intact to light touch.  Gait, station and balance: She stands easily. No veering to one side is noted. No leaning to one side is noted. Posture is age-appropriate and stance is narrow based. Gait shows normal stride length and normal pace. No problems turning are noted.   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 The patient decided last time that she did not start the pramipexole. She first indicated that she did not wish to seek treatment for her PLMs and restless leg symptoms but changed her mind towards the end of the visit. She is encouraged to fill her prescription that I provided last time and she was given written instructions again today. We also went over potential SEs and she is encouraged to make a follow-up appt with  her ophthalmologist for her recent episode of visual disturbance and to also make a follow-up appointment with Dr. Leonie Man for her recent episode of confusion. Physical exam is stable to slightly better. We reviewed her sleep study results again today. Thankfully, she did not have any significant sleep disordered breathing. I suggested a three-month follow-up with one of our nurse practitioners. I answered all her questions today and the patient was in agreement. I spent 25 minutes in total face-to-face time with the patient, more than 50% of which was spent in counseling and coordination of care, reviewing test results, reviewing medication and discussing or reviewing the diagnosis of PLMD, its prognosis and treatment options. Pertinent laboratory and imaging test results that were available during this visit with the patient were reviewed by me and considered in my medical decision making (see chart for details).

## 2016-07-24 DIAGNOSIS — F4310 Post-traumatic stress disorder, unspecified: Secondary | ICD-10-CM | POA: Diagnosis not present

## 2016-07-29 LAB — CUP PACEART REMOTE DEVICE CHECK
Date Time Interrogation Session: 20180429220609
MDC IDC PG IMPLANT DT: 20170703

## 2016-07-30 ENCOUNTER — Ambulatory Visit (INDEPENDENT_AMBULATORY_CARE_PROVIDER_SITE_OTHER): Payer: BLUE CROSS/BLUE SHIELD | Admitting: *Deleted

## 2016-07-30 DIAGNOSIS — I639 Cerebral infarction, unspecified: Secondary | ICD-10-CM

## 2016-07-30 NOTE — Progress Notes (Signed)
Carelink Summary Report / Loop Recorder 

## 2016-08-07 DIAGNOSIS — F4310 Post-traumatic stress disorder, unspecified: Secondary | ICD-10-CM | POA: Diagnosis not present

## 2016-08-12 NOTE — Progress Notes (Signed)
[facility] summary report received. Battery status OK. Normal device function. No new symptom episodes, tachy episodes, brady, or pause episodes. 1 AF- 0% ECG appears SR w/ PVCs. Monthly summary reports and ROV/PRN 

## 2016-08-22 DIAGNOSIS — F4310 Post-traumatic stress disorder, unspecified: Secondary | ICD-10-CM | POA: Diagnosis not present

## 2016-08-28 ENCOUNTER — Ambulatory Visit (INDEPENDENT_AMBULATORY_CARE_PROVIDER_SITE_OTHER): Payer: BLUE CROSS/BLUE SHIELD | Admitting: *Deleted

## 2016-08-28 DIAGNOSIS — I639 Cerebral infarction, unspecified: Secondary | ICD-10-CM

## 2016-08-28 DIAGNOSIS — F3342 Major depressive disorder, recurrent, in full remission: Secondary | ICD-10-CM | POA: Diagnosis not present

## 2016-08-30 LAB — CUP PACEART REMOTE DEVICE CHECK
Implantable Pulse Generator Implant Date: 20170703
MDC IDC SESS DTM: 20180529220905

## 2016-08-30 NOTE — Progress Notes (Signed)
[facility] Summary Report 

## 2016-09-10 DIAGNOSIS — F4310 Post-traumatic stress disorder, unspecified: Secondary | ICD-10-CM | POA: Diagnosis not present

## 2016-09-24 ENCOUNTER — Encounter: Payer: Self-pay | Admitting: Internal Medicine

## 2016-09-24 DIAGNOSIS — R413 Other amnesia: Secondary | ICD-10-CM | POA: Diagnosis not present

## 2016-09-24 DIAGNOSIS — M25551 Pain in right hip: Secondary | ICD-10-CM | POA: Diagnosis not present

## 2016-09-24 DIAGNOSIS — F419 Anxiety disorder, unspecified: Secondary | ICD-10-CM | POA: Diagnosis not present

## 2016-09-27 ENCOUNTER — Ambulatory Visit (INDEPENDENT_AMBULATORY_CARE_PROVIDER_SITE_OTHER): Payer: BLUE CROSS/BLUE SHIELD | Admitting: *Deleted

## 2016-09-27 DIAGNOSIS — I639 Cerebral infarction, unspecified: Secondary | ICD-10-CM

## 2016-09-28 NOTE — Progress Notes (Signed)
Carelink Summary Report / Loop Recorder 

## 2016-10-01 DIAGNOSIS — F4310 Post-traumatic stress disorder, unspecified: Secondary | ICD-10-CM | POA: Diagnosis not present

## 2016-10-08 DIAGNOSIS — M25552 Pain in left hip: Secondary | ICD-10-CM | POA: Diagnosis not present

## 2016-10-08 DIAGNOSIS — M5134 Other intervertebral disc degeneration, thoracic region: Secondary | ICD-10-CM | POA: Diagnosis not present

## 2016-10-08 DIAGNOSIS — M25551 Pain in right hip: Secondary | ICD-10-CM | POA: Diagnosis not present

## 2016-10-08 LAB — CUP PACEART REMOTE DEVICE CHECK
Date Time Interrogation Session: 20180628224104
MDC IDC PG IMPLANT DT: 20170703

## 2016-10-09 ENCOUNTER — Ambulatory Visit: Payer: BLUE CROSS/BLUE SHIELD | Admitting: Neurology

## 2016-10-24 DIAGNOSIS — M25552 Pain in left hip: Secondary | ICD-10-CM | POA: Diagnosis not present

## 2016-10-24 DIAGNOSIS — M5134 Other intervertebral disc degeneration, thoracic region: Secondary | ICD-10-CM | POA: Diagnosis not present

## 2016-10-24 DIAGNOSIS — M25551 Pain in right hip: Secondary | ICD-10-CM | POA: Diagnosis not present

## 2016-10-25 ENCOUNTER — Ambulatory Visit: Payer: BLUE CROSS/BLUE SHIELD | Admitting: Adult Health

## 2016-10-29 ENCOUNTER — Ambulatory Visit (INDEPENDENT_AMBULATORY_CARE_PROVIDER_SITE_OTHER): Payer: BLUE CROSS/BLUE SHIELD | Admitting: *Deleted

## 2016-10-29 DIAGNOSIS — I639 Cerebral infarction, unspecified: Secondary | ICD-10-CM

## 2016-10-29 DIAGNOSIS — F4310 Post-traumatic stress disorder, unspecified: Secondary | ICD-10-CM | POA: Diagnosis not present

## 2016-10-30 DIAGNOSIS — F3342 Major depressive disorder, recurrent, in full remission: Secondary | ICD-10-CM | POA: Diagnosis not present

## 2016-10-30 NOTE — Progress Notes (Signed)
Carelink Summary Report / Loop Recorder 

## 2016-11-01 DIAGNOSIS — M25551 Pain in right hip: Secondary | ICD-10-CM | POA: Diagnosis not present

## 2016-11-01 DIAGNOSIS — M25552 Pain in left hip: Secondary | ICD-10-CM | POA: Diagnosis not present

## 2016-11-01 DIAGNOSIS — M5134 Other intervertebral disc degeneration, thoracic region: Secondary | ICD-10-CM | POA: Diagnosis not present

## 2016-11-02 DIAGNOSIS — R419 Unspecified symptoms and signs involving cognitive functions and awareness: Secondary | ICD-10-CM | POA: Diagnosis not present

## 2016-11-02 DIAGNOSIS — I6930 Unspecified sequelae of cerebral infarction: Secondary | ICD-10-CM | POA: Diagnosis not present

## 2016-11-08 LAB — CUP PACEART REMOTE DEVICE CHECK
Implantable Pulse Generator Implant Date: 20170703
MDC IDC SESS DTM: 20180728234719

## 2016-11-08 NOTE — Progress Notes (Signed)
Carelink summary report received. Battery status OK. Normal device function. No new symptom episodes, tachy episodes, brady, or pause episodes. No new AF episodes. Monthly summary reports and ROV/PRN 

## 2016-11-12 DIAGNOSIS — S92912A Unspecified fracture of left toe(s), initial encounter for closed fracture: Secondary | ICD-10-CM | POA: Diagnosis not present

## 2016-11-26 ENCOUNTER — Ambulatory Visit (INDEPENDENT_AMBULATORY_CARE_PROVIDER_SITE_OTHER): Payer: BLUE CROSS/BLUE SHIELD | Admitting: *Deleted

## 2016-11-26 DIAGNOSIS — I639 Cerebral infarction, unspecified: Secondary | ICD-10-CM | POA: Diagnosis not present

## 2016-11-27 NOTE — Progress Notes (Signed)
Carelink Summary Report / Loop Recorder 

## 2016-11-29 DIAGNOSIS — I6930 Unspecified sequelae of cerebral infarction: Secondary | ICD-10-CM | POA: Diagnosis not present

## 2016-11-29 DIAGNOSIS — S92912A Unspecified fracture of left toe(s), initial encounter for closed fracture: Secondary | ICD-10-CM | POA: Diagnosis not present

## 2016-11-29 DIAGNOSIS — Z13220 Encounter for screening for lipoid disorders: Secondary | ICD-10-CM | POA: Diagnosis not present

## 2016-11-29 DIAGNOSIS — R7303 Prediabetes: Secondary | ICD-10-CM | POA: Diagnosis not present

## 2016-11-30 LAB — CUP PACEART REMOTE DEVICE CHECK
MDC IDC PG IMPLANT DT: 20170703
MDC IDC SESS DTM: 20180827233937

## 2016-12-06 DIAGNOSIS — F4310 Post-traumatic stress disorder, unspecified: Secondary | ICD-10-CM | POA: Diagnosis not present

## 2016-12-19 ENCOUNTER — Telehealth: Payer: Self-pay | Admitting: Cardiology

## 2016-12-19 NOTE — Telephone Encounter (Signed)
Attempted to call pt b/c her home monitor has not updated in at least 14 days. No answer and unable to leave a message.  

## 2016-12-26 ENCOUNTER — Ambulatory Visit: Payer: BLUE CROSS/BLUE SHIELD | Admitting: *Deleted

## 2016-12-26 ENCOUNTER — Encounter: Payer: Self-pay | Admitting: Cardiology

## 2016-12-27 NOTE — Progress Notes (Signed)
Carelink Summary Report / Loop Recorder 

## 2017-01-10 DIAGNOSIS — F4310 Post-traumatic stress disorder, unspecified: Secondary | ICD-10-CM | POA: Diagnosis not present

## 2017-01-14 IMAGING — MR MR HEAD W/O CM
9 of 11 series · 33 of 48 positions shown · non-contrast
Comparison: MRI brain 09/29/2015. MRI brain 09/25/2015. CT head
10/28/2015.

CLINICAL DATA: Acute onset of RIGHT-sided weakness. History of
stroke in [REDACTED] affecting the RIGHT hemisphere.

EXAM:
MRI HEAD WITHOUT CONTRAST
MRA HEAD WITHOUT CONTRAST
TECHNIQUE: Multiplanar, multiecho pulse sequences of the brain and surrounding
structures were obtained without intravenous contrast. Angiographic
images of the head were obtained using MRA technique without
contrast.

[Series 3: (id) mt fs · axial · 1.4mm · 0.43mm/px · z∈[-31,+4]mm · 4 of 136 slices shown]
[im 1/136]
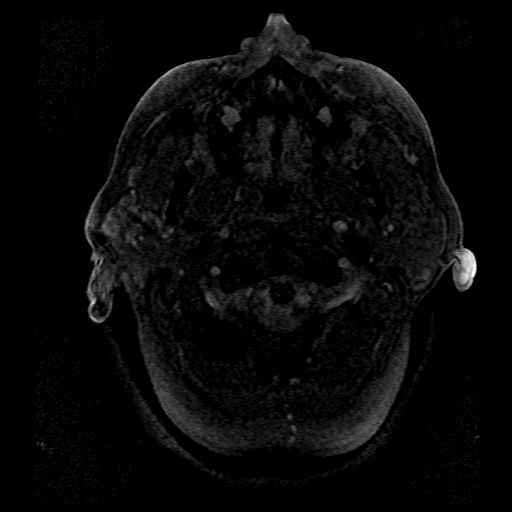
[im 17/136]
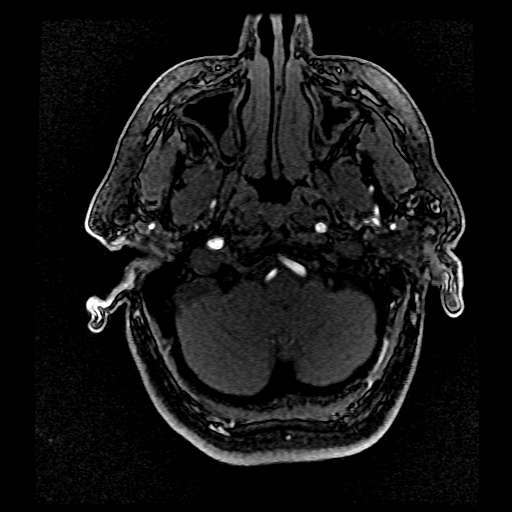
[im 34/136]
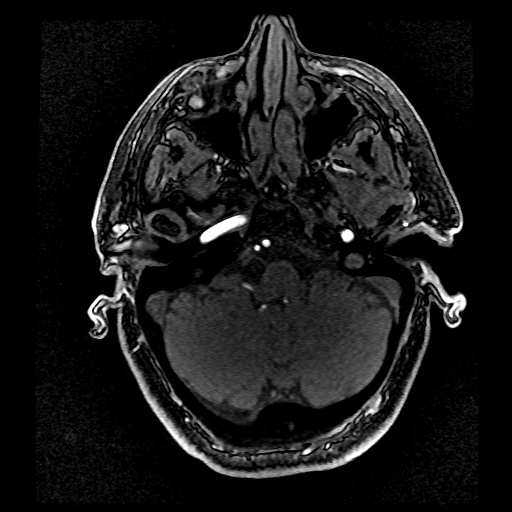
[im 51/136]
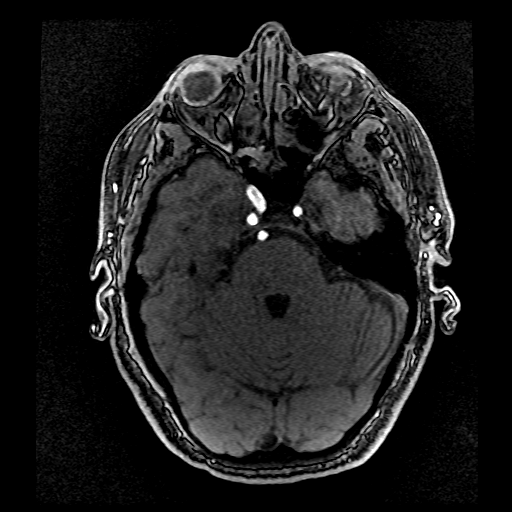

[Series 4: DWI · axial · 3.0mm · 1.09mm/px · z∈[-42,+108]mm · 8 of 102 slices shown (1 of 4)]
[im 1/102]
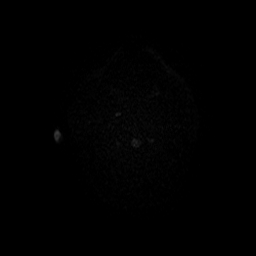
[im 15/102]
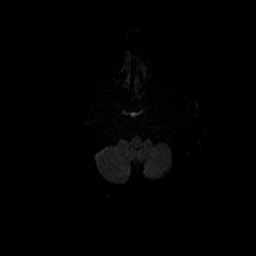
[im 29/102]
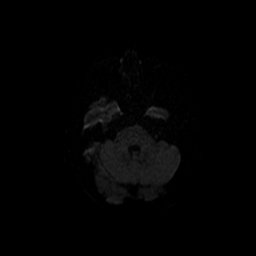
[im 44/102]
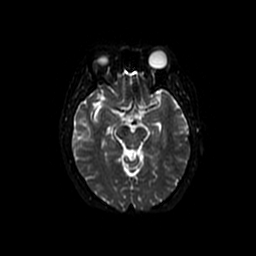
[im 58/102]
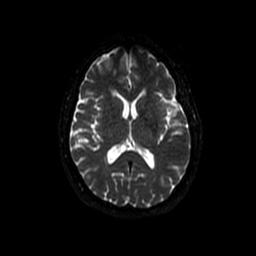
[im 73/102]
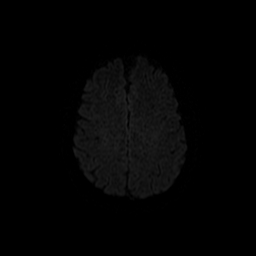
[im 87/102]
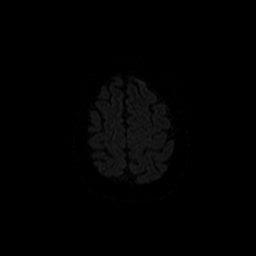
[im 102/102]
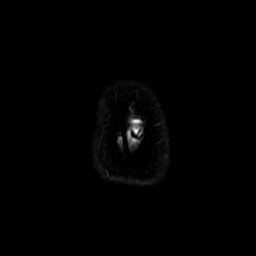

[Series 5: T1 · sagittal · 5.0mm · 0.47mm/px · 2 of 25 slices shown]
[im 1/25]
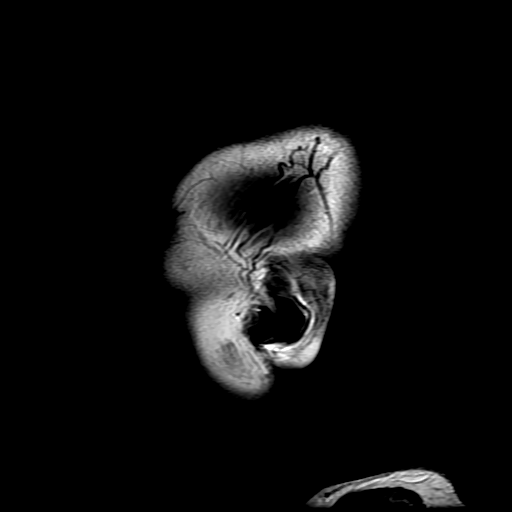
[im 25/25]
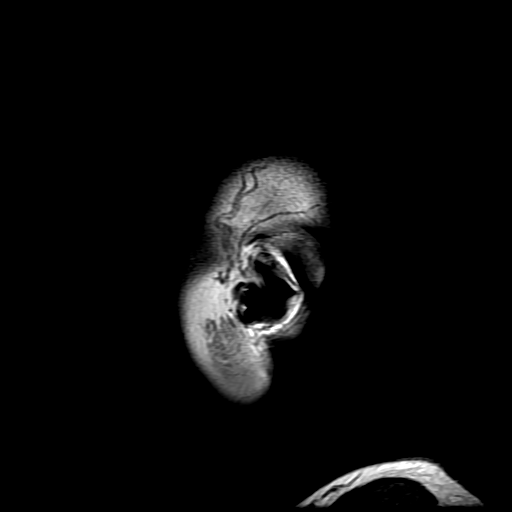

[Series 6: T2 · axial · 5.0mm · 0.43mm/px · z∈[-37,+107]mm · 2 of 25 slices shown]
[im 1/25]
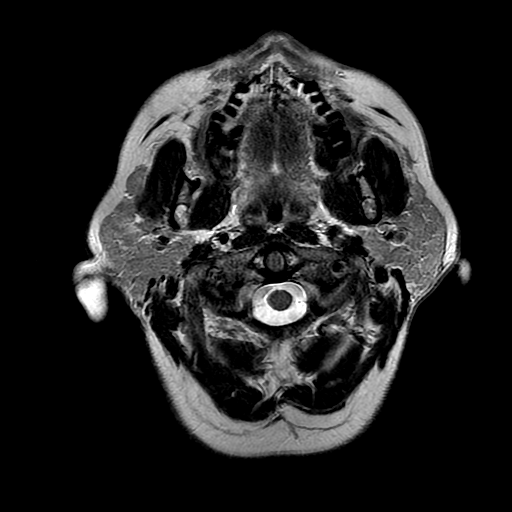
[im 25/25]
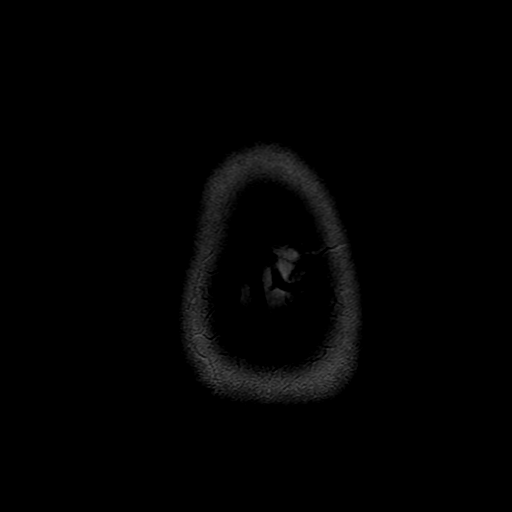

[Series 7: DWI · coronal · 5.0mm · 1.09mm/px · 6 of 74 slices shown (2 of 4)]
[im 1/74]
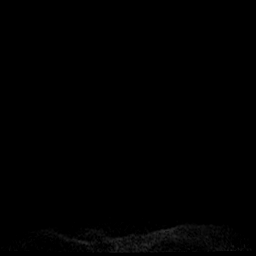
[im 15/74]
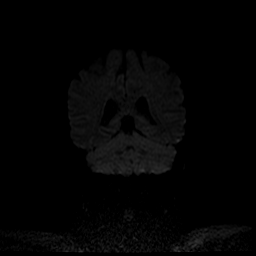
[im 30/74]
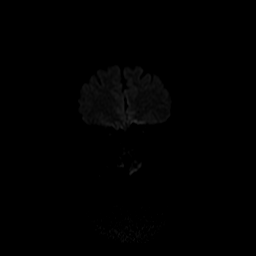
[im 44/74]
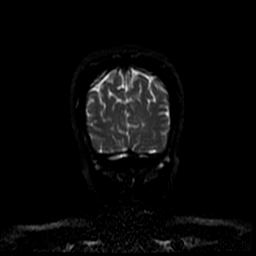
[im 59/74]
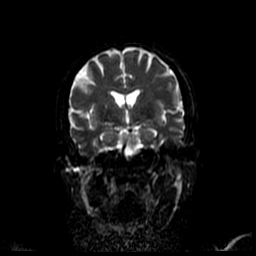
[im 74/74]
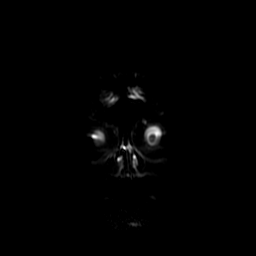

[Series 8: FLAIR · axial · 5.0mm · 0.43mm/px · z∈[-37,+107]mm · 2 of 25 slices shown]
[im 1/25]
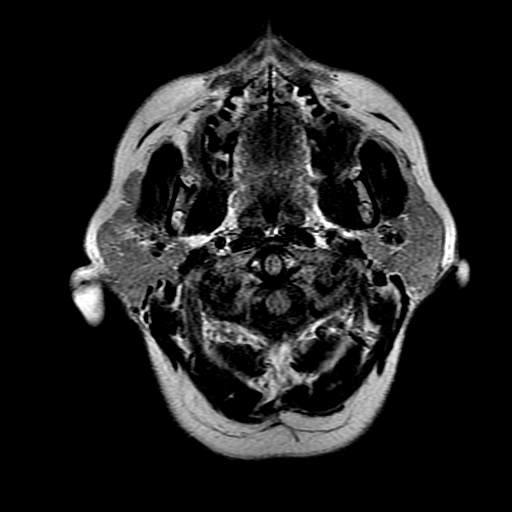
[im 25/25]
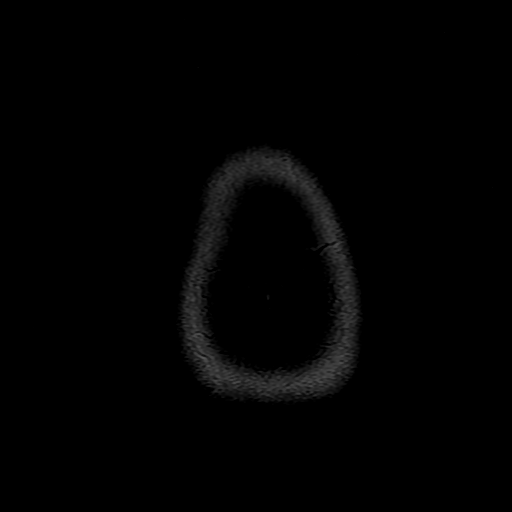

[Series 11: T2 post-contrast · coronal · 5.0mm · 0.39mm/px · 2 of 28 slices shown]
[im 1/28]
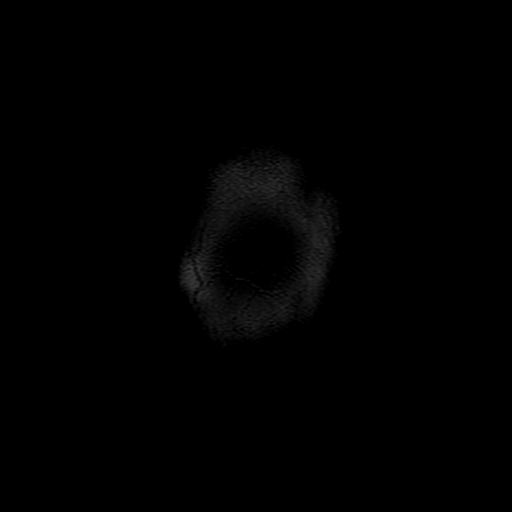
[im 28/28]
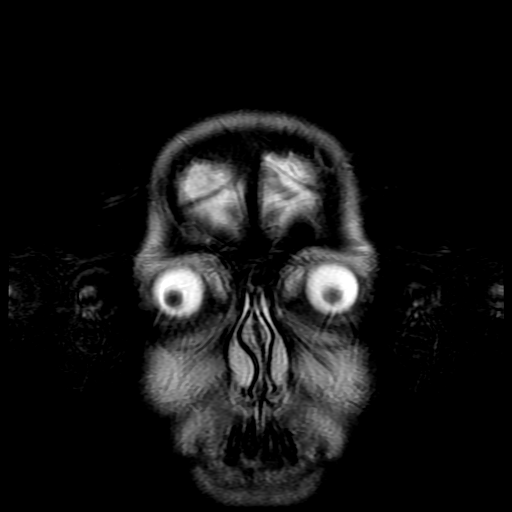

[Series 400: DWI · axial · 3.0mm · 1.09mm/px · z∈[-42,+108]mm · 4 of 51 slices shown (3 of 4)]
[im 1/51]
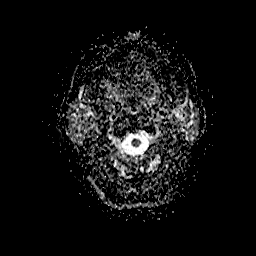
[im 17/51]
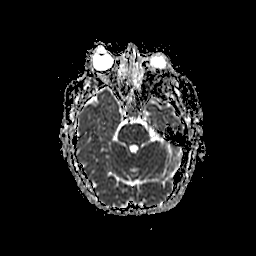
[im 34/51]
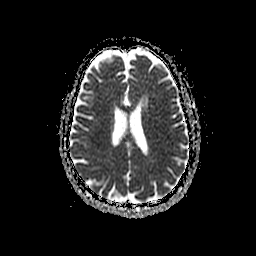
[im 51/51]
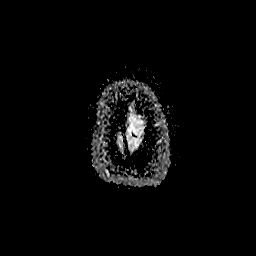

[Series 700: DWI · coronal · 5.0mm · 1.09mm/px · 3 of 37 slices shown (4 of 4)]
[im 1/37]
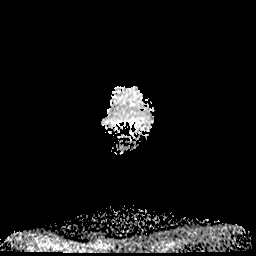
[im 19/37]
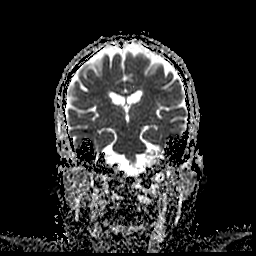
[im 37/37]
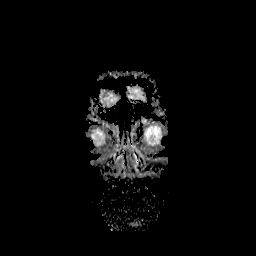

[33 of 48 positions shown; findings below may reference images not displayed]

FINDINGS: MRI HEAD FINDINGS

Restricted diffusion again noted in the RIGHT posterior frontal
cortex and subcortical white matter, less intense compared with most
recent prior, consistent with expected evolution/ subacute time
course. No areas of acute infarction in the LEFT hemisphere are
observed.

No mass lesion, hydrocephalus, extra-axial fluid, or hemorrhage.

Normal for age cerebral volume. Mild subcortical and periventricular
T2 and FLAIR hyperintensities, likely chronic microvascular ischemic
change. No midline abnormality. Flow voids are maintained.
Extracranial soft tissues unremarkable.

MRA HEAD FINDINGS

Widely patent internal carotid arteries, basilar artery, and both
vertebral arteries. Dolichoectatic intracranial vasculature without
stenosis or occlusion. No intracranial aneurysm. No congenital
anomaly or distal MCA branch lesion.
IMPRESSION: No acute LEFT hemisphere event is identified. Subacute RIGHT
hemisphere nonhemorrhagic infarction.

Stable mild chronic microvascular ischemic change.

No intracranial stenosis or large vessel occlusion.

## 2017-01-15 ENCOUNTER — Telehealth: Payer: Self-pay | Admitting: Psychology

## 2017-01-15 NOTE — Telephone Encounter (Signed)
Pt called and needs her records from her cognitive testing from last year and is asking if she can pick them up today, please call and let her know when ready so she can stop by

## 2017-01-15 NOTE — Telephone Encounter (Signed)
Report given to [facility] and is ready for her to pick up.

## 2017-01-16 DIAGNOSIS — R419 Unspecified symptoms and signs involving cognitive functions and awareness: Secondary | ICD-10-CM | POA: Diagnosis not present

## 2017-01-25 ENCOUNTER — Ambulatory Visit (INDEPENDENT_AMBULATORY_CARE_PROVIDER_SITE_OTHER): Payer: BLUE CROSS/BLUE SHIELD | Admitting: *Deleted

## 2017-01-25 DIAGNOSIS — I639 Cerebral infarction, unspecified: Secondary | ICD-10-CM | POA: Diagnosis not present

## 2017-01-28 NOTE — Progress Notes (Signed)
Carelink Summary Report / Loop Recorder 

## 2017-01-31 LAB — CUP PACEART REMOTE DEVICE CHECK
Date Time Interrogation Session: 20181027001023
MDC IDC PG IMPLANT DT: 20170703

## 2017-02-01 DIAGNOSIS — R419 Unspecified symptoms and signs involving cognitive functions and awareness: Secondary | ICD-10-CM | POA: Diagnosis not present

## 2017-02-01 DIAGNOSIS — R569 Unspecified convulsions: Secondary | ICD-10-CM | POA: Diagnosis not present

## 2017-02-25 ENCOUNTER — Ambulatory Visit (INDEPENDENT_AMBULATORY_CARE_PROVIDER_SITE_OTHER): Payer: BLUE CROSS/BLUE SHIELD | Admitting: *Deleted

## 2017-02-25 DIAGNOSIS — I639 Cerebral infarction, unspecified: Secondary | ICD-10-CM | POA: Diagnosis not present

## 2017-02-26 ENCOUNTER — Emergency Department (HOSPITAL_COMMUNITY): Payer: BLUE CROSS/BLUE SHIELD

## 2017-02-26 ENCOUNTER — Emergency Department (HOSPITAL_COMMUNITY)
Admission: EM | Admit: 2017-02-26 | Discharge: 2017-02-27 | Disposition: A | Discharge status: Home / Self Care | Payer: BLUE CROSS/BLUE SHIELD | Attending: Emergency Medicine | Admitting: Emergency Medicine

## 2017-02-26 ENCOUNTER — Encounter (HOSPITAL_COMMUNITY): Payer: Self-pay | Admitting: Emergency Medicine

## 2017-02-26 ENCOUNTER — Other Ambulatory Visit: Payer: Self-pay

## 2017-02-26 DIAGNOSIS — Z7982 Long term (current) use of aspirin: Secondary | ICD-10-CM | POA: Insufficient documentation

## 2017-02-26 DIAGNOSIS — Z79899 Other long term (current) drug therapy: Secondary | ICD-10-CM | POA: Diagnosis not present

## 2017-02-26 DIAGNOSIS — I69351 Hemiplegia and hemiparesis following cerebral infarction affecting right dominant side: Secondary | ICD-10-CM | POA: Diagnosis not present

## 2017-02-26 DIAGNOSIS — R531 Weakness: Secondary | ICD-10-CM

## 2017-02-26 DIAGNOSIS — Z87891 Personal history of nicotine dependence: Secondary | ICD-10-CM | POA: Insufficient documentation

## 2017-02-26 DIAGNOSIS — J45909 Unspecified asthma, uncomplicated: Secondary | ICD-10-CM | POA: Insufficient documentation

## 2017-02-26 DIAGNOSIS — M6281 Muscle weakness (generalized): Secondary | ICD-10-CM | POA: Diagnosis not present

## 2017-02-26 DIAGNOSIS — R202 Paresthesia of skin: Secondary | ICD-10-CM | POA: Diagnosis not present

## 2017-02-26 LAB — COMPREHENSIVE METABOLIC PANEL
ALT: 22 U/L (ref 14–54)
ANION GAP: 10 (ref 5–15)
AST: 27 U/L (ref 15–41)
Albumin: 4 g/dL (ref 3.5–5.0)
Alkaline Phosphatase: 90 U/L (ref 38–126)
BUN: 14 mg/dL (ref 6–20)
CHLORIDE: 105 mmol/L (ref 101–111)
CO2: 24 mmol/L (ref 22–32)
CREATININE: 0.73 mg/dL (ref 0.44–1.00)
Calcium: 9.7 mg/dL (ref 8.9–10.3)
Glucose, Bld: 100 mg/dL — ABNORMAL HIGH (ref 65–99)
Potassium: 3.7 mmol/L (ref 3.5–5.1)
Sodium: 139 mmol/L (ref 135–145)
Total Bilirubin: 1 mg/dL (ref 0.3–1.2)
Total Protein: 7.1 g/dL (ref 6.5–8.1)

## 2017-02-26 LAB — I-STAT TROPONIN, ED: TROPONIN I, POC: 0.01 ng/mL (ref 0.00–0.08)

## 2017-02-26 LAB — I-STAT CHEM 8, ED
BUN: 17 mg/dL (ref 6–20)
CALCIUM ION: 1.21 mmol/L (ref 1.15–1.40)
Chloride: 104 mmol/L (ref 101–111)
Creatinine, Ser: 0.7 mg/dL (ref 0.44–1.00)
GLUCOSE: 99 mg/dL (ref 65–99)
HCT: 40 % (ref 36.0–46.0)
HEMOGLOBIN: 13.6 g/dL (ref 12.0–15.0)
POTASSIUM: 3.7 mmol/L (ref 3.5–5.1)
Sodium: 142 mmol/L (ref 135–145)
TCO2: 25 mmol/L (ref 22–32)

## 2017-02-26 LAB — DIFFERENTIAL
BASOS PCT: 1 %
Basophils Absolute: 0 10*3/uL (ref 0.0–0.1)
EOS ABS: 0.2 10*3/uL (ref 0.0–0.7)
EOS PCT: 3 %
Lymphocytes Relative: 33 %
Lymphs Abs: 2.6 10*3/uL (ref 0.7–4.0)
MONO ABS: 0.5 10*3/uL (ref 0.1–1.0)
MONOS PCT: 7 %
NEUTROS ABS: 4.5 10*3/uL (ref 1.7–7.7)
Neutrophils Relative %: 56 %

## 2017-02-26 LAB — CBC
HEMATOCRIT: 38.1 % (ref 36.0–46.0)
Hemoglobin: 12.9 g/dL (ref 12.0–15.0)
MCH: 32.7 pg (ref 26.0–34.0)
MCHC: 33.9 g/dL (ref 30.0–36.0)
MCV: 96.7 fL (ref 78.0–100.0)
Platelets: 206 10*3/uL (ref 150–400)
RBC: 3.94 MIL/uL (ref 3.87–5.11)
RDW: 13.8 % (ref 11.5–15.5)
WBC: 7.9 10*3/uL (ref 4.0–10.5)

## 2017-02-26 LAB — PROTIME-INR
INR: 0.89
PROTHROMBIN TIME: 12 s (ref 11.4–15.2)

## 2017-02-26 LAB — APTT: aPTT: 30 seconds (ref 24–36)

## 2017-02-26 LAB — I-STAT BETA HCG BLOOD, ED (MC, WL, AP ONLY): HCG, QUANTITATIVE: 5.2 m[IU]/mL — AB (ref <5)

## 2017-02-26 MED ORDER — LORAZEPAM 2 MG/ML IJ SOLN
1.0000 mg | Freq: Once | INTRAMUSCULAR | Status: AC
  Administered 2017-02-26: 1 mg via INTRAVENOUS
  Filled 2017-02-26: qty 1

## 2017-02-26 MED ORDER — KETOROLAC TROMETHAMINE 30 MG/ML IJ SOLN
30.0000 mg | Freq: Once | INTRAMUSCULAR | Status: AC
  Administered 2017-02-26: 30 mg via INTRAVENOUS
  Filled 2017-02-26: qty 1

## 2017-02-26 NOTE — ED Notes (Signed)
Pt c/o "shocks going up from my legs up into my spine." Reports hx of restless leg and feels that symptoms may be exacerbated by feeling tired.

## 2017-02-26 NOTE — ED Notes (Signed)
Patient back from CT. MD at bedside. Patient requesting to go to the bathroom - refuses to use bedpan or bedside commode - assisted to restroom in wheelchair with EMT present.

## 2017-02-26 NOTE — ED Notes (Signed)
Dr. Kirkpatrick at bedside 

## 2017-02-26 NOTE — Consult Note (Signed)
Neurology Consultation Reason for Consult: Right-sided weakness Referring Physician: Little, R  CC: Right-sided weakness  History is obtained from: Patient  HPI: Jenna Delgado is a 56 y.o. female with a history of previous RIGHT sided infarct who presents with right-sided weakness and numbness.  She had a stroke in June of last year and since that time has been having recurrent episodes of unusual symptoms.  She describes them as varying in description, sometimes being like water is drifting off of her, sometimes being episodes of confusion, sometimes being episodes of numbness or weakness.  I saw her in November of last year at which time I had some concern she may be having complicated migraines versus conversion disorder.  She improved with Compazine at that time.  She currently describes an episode that started in her foot and then spread to her arm over the course of multiple minutes, she now has numbness and weakness of her right arm and leg.  She denies headache.  [facility] where an ambulatory EEG was ordered to evaluate for possible seizures with her previous stroke suspected as a possible focus.   LKW: 6:30 PM tpa given?: no, stroke not suspected    ROS: A 14 point ROS was performed and is negative except as noted in the HPI.   Past Medical History:  Diagnosis Date  . Asthma   . Mild bipolar disorder ([facility])   . Prediabetes   . Seasonal allergic rhinitis   . Stroke ([facility])   . Vitamin D deficiency      Family History  Adopted: Yes     Social History:  reports that she has quit smoking. she has never used smokeless tobacco. She reports that she drinks alcohol. She reports that she uses drugs. Drug: Marijuana.   Exam: Current vital signs: BP 111/79   Pulse 66   Temp 98.4 F (36.9 C) (Temporal)   Resp 12   Ht 5\' 4"  (1.626 m)   Wt 70.3 kg (155 lb)   SpO2 98%   BMI 26.61 kg/m  Vital signs in last 24 hours: Temp:  [98.4 F (36.9 C)] 98.4 F  (36.9 C) (11/27 2017) Pulse Rate:  [66-75] 66 (11/27 2130) Resp:  [12-14] 12 (11/27 2130) BP: (104-137)/(66-82) 111/79 (11/27 2130) SpO2:  [96 %-100 %] 98 % (11/27 2130) Weight:  [70.3 kg (155 lb)] 70.3 kg (155 lb) (11/27 2017)   Physical Exam  Constitutional: Appears well-developed and well-nourished.  Psych: Affect appropriate to situation Eyes: No scleral injection HENT: No OP obstrucion Head: Normocephalic.  Cardiovascular: Normal rate and regular rhythm.  Respiratory: Effort normal and breath sounds normal to anterior ascultation GI: Soft.  No distension. There is no tenderness.  Skin: WDI  Neuro: Mental Status: Patient is awake, alert, oriented to person, place, month, year, and situation. Patient is able to give a clear and coherent history. No signs of aphasia or neglect Cranial Nerves: II: Visual Fields are full. Pupils are equal, round, and reactive to light.   III,IV, VI: EOMI without ptosis or diploplia.  V: Facial sensation is decreased in V2 and V3 on the right side compared to the left VII: Facial movement is symmetric.  VIII: hearing is intact to voice X: Uvula elevates symmetrically XI: Shoulder shrug is symmetric. XII: tongue is midline without atrophy or fasciculations.  Motor: She has an inconsistent exam, sometimes giving resistance versus gravity and sometimes giving complete "give-way" weakness of the right arm and leg.  This does modify  with distraction. Sensory: Sensation is decreased throughout the right side Deep Tendon Reflexes: 2+ and symmetric in the biceps and patellae.  Cerebellar: No clear ataxia on the left.  Unable to perform on the right  I have reviewed labs in epic and the results pertinent to this consultation are: CMP-unremarkable  I have reviewed the images obtained: CT head-unremarkable  Impression: 56 year old female with recurrent non-stereotypical episodes of weakness/numbness/confusion.  Her current episode consisting of  numbness that spread over the course of minutes upper right side also associated with a "buzzing" sensation in her right leg sounds more typical of migraine aura than stroke.  I think conversion disorder is also a significant possibility, though difficult to exclude the combination of real symptoms with superimposed embellishment.  I would favor treating for possible migraine aura without headache, and getting an MRI to rule out recurrent stroke.  With the symptoms are ipsilateral to her previous stroke, so I would not suspect her previous stroke as a focus for seizure would be at all likely to cause the current symptomatology.  The nonstereotyped character of the spells also I think lowers the likelihood of seizure as well.  For evaluation of this, I would defer to her outpatient neurologist.  Recommendations: 1) MRI brain 2) due to multiple reactions to previous medications, could consider using Toradol/Ativan for possible migraine. 3) As long as she is improving, and no acute findings on MRI that I would have her follow-up with her outpatient neurologist.   Ritta SlotMcNeill Asencion Guisinger, MD [facility] [phone]  If 7pm- 7am, please page neurology on call as listed in AMION.

## 2017-02-26 NOTE — ED Notes (Signed)
Code stroke activated @ 1945

## 2017-02-26 NOTE — ED Notes (Signed)
Patient transported to MRI 

## 2017-02-26 NOTE — ED Notes (Signed)
Patient transported to bathroom via wheelchair. Patient able to transfer to and from toilet with minimal assistance.

## 2017-02-26 NOTE — Progress Notes (Signed)
Carelink Summary Report / Loop Recorder 

## 2017-02-26 NOTE — ED Provider Notes (Signed)
[facility] EMERGENCY DEPARTMENT Provider Note   CSN: [id number] Arrival date & time: 02/26/17  5465   An emergency department physician performed an initial assessment on this suspected stroke patient at 70.  History   Chief Complaint Chief Complaint  Patient presents with  . Code Stroke    HPI Jenna Delgado is a 56 y.o. female.  56yo F w/ PMH including stroke, bipolar d/o, asthma who p/w R sided weakness and numbness.  Husband states that earlier today she had a "choppy" voice which she has occasionally had ever since she had a stroke 1.5 years ago.  Later in the day she stated that she did not feel right.  At 6:30 PM she then had a sudden onset of right foot numbness followed by right leg and right hand weakness.  She became concerned about a new stroke given her history of stroke.  She denies any vision changes, chest pain, or headache.  No fevers, vomiting, or recent illness.  No recent falls or head injury.   The history is provided by the patient and the spouse.    Past Medical History:  Diagnosis Date  . Asthma   . Mild bipolar disorder ([place])   . Prediabetes   . Seasonal allergic rhinitis   . Stroke ([place])   . Vitamin D deficiency     Patient Active Problem List   Diagnosis Date Noted  . TIA (transient ischemic attack) 10/28/2015  . Cerebrovascular accident (CVA) ([place])   . Abnormal LFTs   . Cerebral infarction due to unspecified mechanism   . Acute CVA (cerebrovascular accident) ([place]) 09/29/2015  . Abnormal liver function test 09/29/2015  . Asthma 09/29/2015  . Bipolar disorder ([place]) 09/29/2015  . Hyperlipidemia 09/26/2015  . Stroke (cerebrum) ([place]) 09/25/2015  . Hyperglycemia 09/25/2015  . Acute ischemic stroke [facility])     Past Surgical History:  Procedure Laterality Date  . ABDOMINAL HYSTERECTOMY    . BLADDER SUSPENSION    . EP IMPLANTABLE DEVICE N/A 10/03/2015   Procedure: Loop Recorder Insertion;  Surgeon: Evans Lance, MD;   Location: [facility];  Service: Cardiovascular;  Laterality: N/A;  . TEE WITHOUT CARDIOVERSION N/A 10/03/2015   Procedure: TRANSESOPHAGEAL ECHOCARDIOGRAM (TEE);  Surgeon: Thayer Headings, MD;  Location: [facility];  Service: Cardiovascular;  Laterality: N/A;    OB History    No data available       Home Medications    Prior to Admission medications   Medication Sig Start Date End Date Taking? Authorizing Provider  Acetylcarnitine HCl (ACETYL L-CARNITINE PO) Take 1 capsule by mouth daily.   Yes [provider]  Ascorbic Acid (VITAMIN C) 1000 MG tablet Take 1,000 mg by mouth daily.   Yes [provider]  aspirin EC 81 MG tablet Take 81 mg by mouth daily.   Yes [provider]  B Complex Vitamins (B COMPLEX PO) Take 1 tablet by mouth daily.   Yes [provider]  Cholecalciferol (VITAMIN D-1000 MAX ST) 1000 units tablet Take 1,000 Units by mouth daily.   Yes [provider]  Coenzyme Q10 (CO Q-10 PO) Take 1 capsule by mouth daily.   Yes [provider]  fluticasone (FLONASE) 50 MCG/ACT nasal spray Place 1-2 sprays into both nostrils daily as needed for allergies or rhinitis.    Yes [provider]  medium chain triglycerides (MCT OIL) oil Take 15 mLs by mouth daily.   Yes [provider]  Multiple Vitamins-Minerals (  ONE-A-DAY WOMENS 50+ ADVANTAGE) TABS Take 1 tablet by mouth daily.   Yes [provider]  Omega-3 Fatty Acids (FISH OIL PO) Take 1 capsule by mouth daily.   Yes [provider]  Pirbuterol Acetate (MAXAIR AUTOHALER IN) Inhale 1-2 puffs into the lungs every 6 (six) hours as needed (for shortness of breath).    Yes [provider]  TURMERIC PO Take 1,000 mg by mouth daily. IN LIQUID FORM   Yes [provider]  aspirin 325 MG tablet Take 1 tablet (325 mg total) by mouth daily. Patient not taking: Reported on 02/26/2017 09/27/15   Orson Eva, MD    Family  History Family History  Adopted: Yes    Social History Social History   Tobacco Use  . Smoking status: Former Research scientist (life sciences)  . Smokeless tobacco: Never Used  Substance Use Topics  . Alcohol use: Yes    Comment: occasionally  . Drug use: Yes    Types: Marijuana    Comment: occasionally     Allergies   Benadryl [diphenhydramine hcl]; Sumatriptan; Atorvastatin; Iodine; Ivp dye [iodinated diagnostic agents]; Metrizamide; Copper-containing compounds; Crestor [rosuvastatin calcium]; Resveratrol; Statins; Zocor [simvastatin]; Aminophylline; Demeclocycline; Lamictal [lamotrigine]; Lobster [shellfish allergy]; Metoclopramide; Tetracyclines & related; and Theophyllines   Review of Systems Review of Systems All other systems reviewed and are negative except that which was mentioned in HPI   Physical Exam Updated Vital Signs BP 105/68   Pulse 70   Temp 98.4 F (36.9 C) (Temporal)   Resp 12   Ht _0  (1.626 m)   Wt 70.3 kg (155 lb)   SpO2 96%   BMI 26.61 kg/m   Physical Exam  Constitutional: She is oriented to person, place, and time. She appears well-developed and well-nourished. No distress.  Awake, alert, sitting in wheelchair, anxious  HENT:  Head: Normocephalic and atraumatic.  Eyes: Conjunctivae and EOM are normal. Pupils are equal, round, and reactive to light.  Neck: Neck supple.  Cardiovascular: Normal rate, regular rhythm and normal heart sounds.  No murmur heard. Pulmonary/Chest: Effort normal and breath sounds normal. No respiratory distress.  Abdominal: Soft. Bowel sounds are normal. She exhibits no distension. There is no tenderness.  Musculoskeletal: She exhibits no edema.  Neurological: She is alert and oriented to person, place, and time. She has normal reflexes. No cranial nerve deficit. She exhibits normal muscle tone.  Fluent speech, normal finger-to-nose testing on L, hesitant on R with jerking arm , negative pronator drift, no clonus Inconsistent exam:  Decreased sensation R arm and R leg, 4/5 R arm and leg strength however demonstrating normal R arm strength while moving arm in conversation  Skin: Skin is warm and dry.  Psychiatric: Thought content normal.  anxious  Nursing note and vitals reviewed.    ED Treatments / Results  Labs (all labs ordered are listed, but only abnormal results are displayed) Labs Reviewed  COMPREHENSIVE METABOLIC PANEL - Abnormal; Notable for the following components:      Result Value   Glucose, Bld 100 (*)    All other components within normal limits  I-STAT BETA HCG BLOOD, ED (MC, WL, AP ONLY) - Abnormal; Notable for the following components:   I-stat hCG, quantitative 5.2 (*)    All other components within normal limits  PROTIME-INR  APTT  CBC  DIFFERENTIAL  I-STAT TROPONIN, ED  I-STAT CHEM 8, ED  CBG MONITORING, ED    EKG  EKG Interpretation None       Radiology Mr Brain  Wo Contrast (neuro Protocol)  Result Date: 02/26/2017 CLINICAL DATA:  Focal neuro deficit less than 6 hours. Stroke suspected. Right-sided weakness and numbness. EXAM: MRI HEAD WITHOUT CONTRAST TECHNIQUE: Multiplanar, multiecho pulse sequences of the brain and surrounding structures were obtained without intravenous contrast. COMPARISON:  Head CT from earlier today.  Brain MRI 02/07/2016 FINDINGS: Brain: No acute infarction, hemorrhage, hydrocephalus, extra-axial collection or mass lesion. Mild periventricular FLAIR hyperintensity that is stable from prior. Although nonspecific this is usually attributed to microvascular ischemia, especially given patient had a white matter infarct on the 2017 brain MRI. Vascular: Major flow voids are preserved. Skull and upper cervical spine: Negative for marrow lesion. Sinuses/Orbits: Mild mucosal thickening in the maxillary sinuses, chronic. IMPRESSION: No acute finding or change from prior. No explanation for right-sided weakness. Electronically Signed   By: Monte Fantasia M.D.   On:  02/26/2017 23:22   Ct Head Code Stroke Wo Contrast  Result Date: 02/26/2017 CLINICAL DATA:  Code stroke. Initial evaluation for acute aphasia with right-sided weakness. EXAM: CT HEAD WITHOUT CONTRAST TECHNIQUE: Contiguous axial images were obtained from the base of the skull through the vertex without intravenous contrast. COMPARISON:  Prior CT from 04/22/2016. FINDINGS: Brain: Atrophy with mild chronic small vessel ischemic disease. No acute intracranial hemorrhage. No evidence for acute large vessel territory infarct. No mass lesion, midline shift or mass effect. No hydrocephalus. No extra-axial fluid collection. Vascular: No asymmetric hyperdense vessel. Scattered vascular calcifications noted within the carotid siphons. Skull: Scalp soft tissues and calvarium within normal limits. Sinuses/Orbits: Globes and oval soft tissues normal. Scattered mucosal thickening within the ethmoidal air cells and maxillary sinuses bilaterally, greater on the right. No mastoid effusion. Other: None. ASPECTS [facility] Stroke Program Early CT Score) - Ganglionic level infarction (caudate, lentiform nuclei, internal capsule, insula, M1-M3 cortex): 7 - Supraganglionic infarction (M4-M6 cortex): 3 Total score (0-10 with 10 being normal): 10 IMPRESSION: 1. No acute intracranial infarct or other process identified. 2. ASPECTS is 10. Critical Value/emergent results were called by telephone at the time of interpretation on 02/26/2017 at 8:15 pm to Dr. Leonel Ramsay, who verbally acknowledged these results. Electronically Signed   By: Jeannine Boga M.D.   On: 02/26/2017 20:15    Procedures Procedures (including critical care time)  Medications Ordered in ED Medications  ketorolac (TORADOL) 30 MG/ML injection 30 mg (30 mg Intravenous Given 02/26/17 2135)  LORazepam (ATIVAN) injection 1 mg (1 mg Intravenous Given 02/26/17 2221)     Initial Impression / Assessment and Plan / ED Course  I have reviewed the triage vital  signs and the nursing notes.  Pertinent labs & imaging results that were available during my care of the patient were reviewed by me and considered in my medical decision making (see chart for details).     Code stroke called at triage and pt taken immediately to [facility] where she was met by neurology, Dr. Leonel Ramsay.  He and I both evaluated patient and felt that her neurologic exam was at times inconsistent; she demonstrated right arm and right leg weakness but then later demonstrated normal upper extremity strength when transferring from wheelchair and when moving her arm while talking.  Head CT negative acute.  Due to inconsistent exam and low suspicion for acute ischemic stroke, Dr. Leonel Ramsay did not recommend TPA.  Labs unremarkable. Dr. Leonel Ramsay recommended toradol and ativan for possible complex migraine, given her medication allergies, and MRI brain to r/o stroke. MRI negative for acute process. Dr. Leonel Ramsay recommended outpatient neurology follow  up; she is scheduled for ambulatory EEG in the near future. On repeat exam, she is comfortable. She has occasional jerking of her body and stiffening that appears to be volitional.  She states that it is related to restless leg syndrome.  I have recommended discussing this problem with her outpatient providers and have discussed supportive measures.  Return precautions given and patient discharged in satisfactory condition.  Final Clinical Impressions(s) / ED Diagnoses   Final diagnoses:  Right sided weakness    ED Discharge Orders    None       Burna Atlas, Wenda Overland, MD 02/27/17 [phone]

## 2017-02-27 NOTE — ED Notes (Signed)
Dr.Little At bedside

## 2017-03-11 LAB — CUP PACEART REMOTE DEVICE CHECK
Implantable Pulse Generator Implant Date: 20170703
MDC IDC SESS DTM: 20181126003924

## 2017-03-14 DIAGNOSIS — R509 Fever, unspecified: Secondary | ICD-10-CM | POA: Diagnosis not present

## 2017-03-21 DIAGNOSIS — R300 Dysuria: Secondary | ICD-10-CM | POA: Diagnosis not present

## 2017-03-27 ENCOUNTER — Ambulatory Visit (INDEPENDENT_AMBULATORY_CARE_PROVIDER_SITE_OTHER): Payer: BLUE CROSS/BLUE SHIELD | Admitting: *Deleted

## 2017-03-27 DIAGNOSIS — I639 Cerebral infarction, unspecified: Secondary | ICD-10-CM

## 2017-03-28 NOTE — Progress Notes (Signed)
Carelink Summary Report / Loop Recorder 

## 2017-03-29 ENCOUNTER — Other Ambulatory Visit: Payer: Self-pay | Admitting: Family Medicine

## 2017-03-29 ENCOUNTER — Other Ambulatory Visit: Payer: BLUE CROSS/BLUE SHIELD

## 2017-03-29 ENCOUNTER — Ambulatory Visit
Admission: RE | Admit: 2017-03-29 | Discharge: 2017-03-29 | Disposition: A | Discharge status: Home / Self Care | Payer: BLUE CROSS/BLUE SHIELD | Source: Ambulatory Visit | Attending: Family Medicine | Admitting: Family Medicine

## 2017-03-29 DIAGNOSIS — R109 Unspecified abdominal pain: Secondary | ICD-10-CM

## 2017-03-29 DIAGNOSIS — R319 Hematuria, unspecified: Secondary | ICD-10-CM | POA: Diagnosis not present

## 2017-04-08 LAB — CUP PACEART REMOTE DEVICE CHECK
Implantable Pulse Generator Implant Date: 20170703
MDC IDC SESS DTM: 20181226003944

## 2017-04-16 DIAGNOSIS — R569 Unspecified convulsions: Secondary | ICD-10-CM | POA: Diagnosis not present

## 2017-04-16 DIAGNOSIS — R4789 Other speech disturbances: Secondary | ICD-10-CM | POA: Diagnosis not present

## 2017-04-17 DIAGNOSIS — R4789 Other speech disturbances: Secondary | ICD-10-CM | POA: Diagnosis not present

## 2017-04-17 DIAGNOSIS — R569 Unspecified convulsions: Secondary | ICD-10-CM | POA: Diagnosis not present

## 2017-04-18 DIAGNOSIS — R4789 Other speech disturbances: Secondary | ICD-10-CM | POA: Diagnosis not present

## 2017-04-18 DIAGNOSIS — R569 Unspecified convulsions: Secondary | ICD-10-CM | POA: Diagnosis not present

## 2017-04-25 ENCOUNTER — Ambulatory Visit (INDEPENDENT_AMBULATORY_CARE_PROVIDER_SITE_OTHER): Payer: BLUE CROSS/BLUE SHIELD | Admitting: *Deleted

## 2017-04-25 DIAGNOSIS — I639 Cerebral infarction, unspecified: Secondary | ICD-10-CM

## 2017-04-26 NOTE — Progress Notes (Signed)
Carelink Summary Report / Loop Recorder 

## 2017-05-03 LAB — CUP PACEART REMOTE DEVICE CHECK
Implantable Pulse Generator Implant Date: 20170703
MDC IDC SESS DTM: 20190125003936

## 2017-05-06 DIAGNOSIS — L659 Nonscarring hair loss, unspecified: Secondary | ICD-10-CM | POA: Diagnosis not present

## 2017-05-06 DIAGNOSIS — R7303 Prediabetes: Secondary | ICD-10-CM | POA: Diagnosis not present

## 2017-05-06 DIAGNOSIS — Z13220 Encounter for screening for lipoid disorders: Secondary | ICD-10-CM | POA: Diagnosis not present

## 2017-05-08 DIAGNOSIS — F4310 Post-traumatic stress disorder, unspecified: Secondary | ICD-10-CM | POA: Diagnosis not present

## 2017-05-15 DIAGNOSIS — F3342 Major depressive disorder, recurrent, in full remission: Secondary | ICD-10-CM | POA: Diagnosis not present

## 2017-05-15 DIAGNOSIS — F410 Panic disorder [episodic paroxysmal anxiety] without agoraphobia: Secondary | ICD-10-CM | POA: Diagnosis not present

## 2017-05-15 DIAGNOSIS — L650 Telogen effluvium: Secondary | ICD-10-CM | POA: Diagnosis not present

## 2017-05-28 ENCOUNTER — Ambulatory Visit (INDEPENDENT_AMBULATORY_CARE_PROVIDER_SITE_OTHER): Payer: BLUE CROSS/BLUE SHIELD | Admitting: *Deleted

## 2017-05-28 DIAGNOSIS — I639 Cerebral infarction, unspecified: Secondary | ICD-10-CM | POA: Diagnosis not present

## 2017-05-29 DIAGNOSIS — F4310 Post-traumatic stress disorder, unspecified: Secondary | ICD-10-CM | POA: Diagnosis not present

## 2017-05-29 NOTE — Progress Notes (Signed)
Carelink Summary Report / Loop Recorder 

## 2017-06-05 DIAGNOSIS — G2581 Restless legs syndrome: Secondary | ICD-10-CM | POA: Diagnosis not present

## 2017-06-05 DIAGNOSIS — L659 Nonscarring hair loss, unspecified: Secondary | ICD-10-CM | POA: Diagnosis not present

## 2017-06-05 DIAGNOSIS — R4189 Other symptoms and signs involving cognitive functions and awareness: Secondary | ICD-10-CM | POA: Diagnosis not present

## 2017-06-17 DIAGNOSIS — F4310 Post-traumatic stress disorder, unspecified: Secondary | ICD-10-CM | POA: Diagnosis not present

## 2017-06-19 DIAGNOSIS — F4310 Post-traumatic stress disorder, unspecified: Secondary | ICD-10-CM | POA: Diagnosis not present

## 2017-07-01 ENCOUNTER — Ambulatory Visit (INDEPENDENT_AMBULATORY_CARE_PROVIDER_SITE_OTHER): Payer: BLUE CROSS/BLUE SHIELD | Admitting: *Deleted

## 2017-07-01 DIAGNOSIS — I639 Cerebral infarction, unspecified: Secondary | ICD-10-CM

## 2017-07-01 NOTE — Progress Notes (Signed)
Carelink Summary Report / Loop Recorder 

## 2017-07-03 LAB — CUP PACEART REMOTE DEVICE CHECK
Date Time Interrogation Session: 20190227014155
Implantable Pulse Generator Implant Date: 20170703

## 2017-07-08 DIAGNOSIS — F4310 Post-traumatic stress disorder, unspecified: Secondary | ICD-10-CM | POA: Diagnosis not present

## 2017-07-15 DIAGNOSIS — F4310 Post-traumatic stress disorder, unspecified: Secondary | ICD-10-CM | POA: Diagnosis not present

## 2017-08-02 ENCOUNTER — Ambulatory Visit (INDEPENDENT_AMBULATORY_CARE_PROVIDER_SITE_OTHER): Payer: BLUE CROSS/BLUE SHIELD | Admitting: *Deleted

## 2017-08-02 DIAGNOSIS — I639 Cerebral infarction, unspecified: Secondary | ICD-10-CM | POA: Diagnosis not present

## 2017-08-05 LAB — CUP PACEART REMOTE DEVICE CHECK
Implantable Pulse Generator Implant Date: 20170703
MDC IDC SESS DTM: 20190401020558

## 2017-08-05 NOTE — Progress Notes (Signed)
Carelink Summary Report / Loop Recorder 

## 2017-08-07 ENCOUNTER — Telehealth: Payer: Self-pay | Admitting: Cardiology

## 2017-08-07 NOTE — Telephone Encounter (Signed)
Spoke w/ pt and requested that she send a manual transmission b/c her home monitor has not updated in at least 14 days.   

## 2017-08-27 LAB — CUP PACEART REMOTE DEVICE CHECK
Date Time Interrogation Session: 20190504020620
MDC IDC PG IMPLANT DT: 20170703

## 2017-09-04 ENCOUNTER — Ambulatory Visit (INDEPENDENT_AMBULATORY_CARE_PROVIDER_SITE_OTHER): Payer: BLUE CROSS/BLUE SHIELD | Admitting: *Deleted

## 2017-09-04 DIAGNOSIS — I639 Cerebral infarction, unspecified: Secondary | ICD-10-CM | POA: Diagnosis not present

## 2017-09-05 NOTE — Progress Notes (Signed)
Carelink Summary Report / Loop Recorder 

## 2017-09-23 DIAGNOSIS — R300 Dysuria: Secondary | ICD-10-CM | POA: Diagnosis not present

## 2017-10-01 DIAGNOSIS — F4310 Post-traumatic stress disorder, unspecified: Secondary | ICD-10-CM | POA: Diagnosis not present

## 2017-10-07 ENCOUNTER — Ambulatory Visit (INDEPENDENT_AMBULATORY_CARE_PROVIDER_SITE_OTHER): Payer: BLUE CROSS/BLUE SHIELD | Admitting: *Deleted

## 2017-10-07 DIAGNOSIS — I639 Cerebral infarction, unspecified: Secondary | ICD-10-CM | POA: Diagnosis not present

## 2017-10-08 NOTE — Progress Notes (Signed)
Carelink Summary Report / Loop Recorder 

## 2017-10-10 DIAGNOSIS — R350 Frequency of micturition: Secondary | ICD-10-CM | POA: Diagnosis not present

## 2017-10-10 DIAGNOSIS — N3001 Acute cystitis with hematuria: Secondary | ICD-10-CM | POA: Diagnosis not present

## 2017-10-11 LAB — CUP PACEART REMOTE DEVICE CHECK
Date Time Interrogation Session: 20190606020634
Implantable Pulse Generator Implant Date: 20170703

## 2017-10-14 DIAGNOSIS — F4310 Post-traumatic stress disorder, unspecified: Secondary | ICD-10-CM | POA: Diagnosis not present

## 2017-11-09 LAB — CUP PACEART REMOTE DEVICE CHECK
Date Time Interrogation Session: 20190709023959
Implantable Pulse Generator Implant Date: 20170703

## 2017-11-11 ENCOUNTER — Ambulatory Visit (INDEPENDENT_AMBULATORY_CARE_PROVIDER_SITE_OTHER): Payer: BLUE CROSS/BLUE SHIELD | Admitting: *Deleted

## 2017-11-11 DIAGNOSIS — I639 Cerebral infarction, unspecified: Secondary | ICD-10-CM | POA: Diagnosis not present

## 2017-11-11 NOTE — Progress Notes (Signed)
Carelink Summary Report / Loop Recorder 

## 2017-11-19 DIAGNOSIS — S40029A Contusion of unspecified upper arm, initial encounter: Secondary | ICD-10-CM | POA: Diagnosis not present

## 2017-12-13 ENCOUNTER — Ambulatory Visit (INDEPENDENT_AMBULATORY_CARE_PROVIDER_SITE_OTHER): Payer: BLUE CROSS/BLUE SHIELD | Admitting: *Deleted

## 2017-12-13 DIAGNOSIS — I639 Cerebral infarction, unspecified: Secondary | ICD-10-CM | POA: Diagnosis not present

## 2017-12-13 NOTE — Progress Notes (Signed)
Carelink Summary Report / Loop Recorder 

## 2017-12-18 LAB — CUP PACEART REMOTE DEVICE CHECK
MDC IDC PG IMPLANT DT: 20170703
MDC IDC SESS DTM: 20190811064127

## 2017-12-28 DIAGNOSIS — F4310 Post-traumatic stress disorder, unspecified: Secondary | ICD-10-CM | POA: Diagnosis not present

## 2017-12-28 LAB — CUP PACEART REMOTE DEVICE CHECK
Date Time Interrogation Session: 20190913104229
Implantable Pulse Generator Implant Date: 20170703

## 2018-01-07 DIAGNOSIS — F4310 Post-traumatic stress disorder, unspecified: Secondary | ICD-10-CM | POA: Diagnosis not present

## 2018-01-15 ENCOUNTER — Ambulatory Visit (INDEPENDENT_AMBULATORY_CARE_PROVIDER_SITE_OTHER): Payer: BLUE CROSS/BLUE SHIELD | Admitting: *Deleted

## 2018-01-15 DIAGNOSIS — I639 Cerebral infarction, unspecified: Secondary | ICD-10-CM | POA: Diagnosis not present

## 2018-01-15 NOTE — Progress Notes (Signed)
Carelink Summary Report / Loop Recorder 

## 2018-01-20 DIAGNOSIS — F4310 Post-traumatic stress disorder, unspecified: Secondary | ICD-10-CM | POA: Diagnosis not present

## 2018-01-27 ENCOUNTER — Emergency Department (HOSPITAL_COMMUNITY)
Admission: EM | Admit: 2018-01-27 | Discharge: 2018-01-27 | Discharge status: Against Medical Advice | Payer: BLUE CROSS/BLUE SHIELD | Attending: Emergency Medicine | Admitting: Emergency Medicine

## 2018-01-27 ENCOUNTER — Encounter (HOSPITAL_COMMUNITY): Payer: Self-pay | Admitting: Emergency Medicine

## 2018-01-27 DIAGNOSIS — Z87891 Personal history of nicotine dependence: Secondary | ICD-10-CM | POA: Insufficient documentation

## 2018-01-27 DIAGNOSIS — I6930 Unspecified sequelae of cerebral infarction: Secondary | ICD-10-CM | POA: Diagnosis not present

## 2018-01-27 DIAGNOSIS — Z7982 Long term (current) use of aspirin: Secondary | ICD-10-CM | POA: Diagnosis not present

## 2018-01-27 DIAGNOSIS — J45909 Unspecified asthma, uncomplicated: Secondary | ICD-10-CM | POA: Diagnosis not present

## 2018-01-27 DIAGNOSIS — I10 Essential (primary) hypertension: Secondary | ICD-10-CM | POA: Diagnosis not present

## 2018-01-27 DIAGNOSIS — Z8673 Personal history of transient ischemic attack (TIA), and cerebral infarction without residual deficits: Secondary | ICD-10-CM | POA: Diagnosis not present

## 2018-01-27 DIAGNOSIS — G8190 Hemiplegia, unspecified affecting unspecified side: Secondary | ICD-10-CM | POA: Diagnosis not present

## 2018-01-27 DIAGNOSIS — R202 Paresthesia of skin: Secondary | ICD-10-CM

## 2018-01-27 DIAGNOSIS — G459 Transient cerebral ischemic attack, unspecified: Secondary | ICD-10-CM | POA: Diagnosis not present

## 2018-01-27 DIAGNOSIS — R29810 Facial weakness: Secondary | ICD-10-CM | POA: Diagnosis not present

## 2018-01-27 LAB — CUP PACEART REMOTE DEVICE CHECK
Implantable Pulse Generator Implant Date: 20170703
MDC IDC SESS DTM: 20191016103822

## 2018-01-27 LAB — I-STAT CHEM 8, ED
BUN: 28 mg/dL — ABNORMAL HIGH (ref 6–20)
CREATININE: 0.6 mg/dL (ref 0.44–1.00)
Calcium, Ion: 1.2 mmol/L (ref 1.15–1.40)
Chloride: 109 mmol/L (ref 98–111)
Glucose, Bld: 87 mg/dL (ref 70–99)
HCT: 43 % (ref 36.0–46.0)
HEMOGLOBIN: 14.6 g/dL (ref 12.0–15.0)
Potassium: 4.1 mmol/L (ref 3.5–5.1)
SODIUM: 140 mmol/L (ref 135–145)
TCO2: 22 mmol/L (ref 22–32)

## 2018-01-27 LAB — COMPREHENSIVE METABOLIC PANEL
ALT: 16 U/L (ref 0–44)
ANION GAP: 9 (ref 5–15)
AST: 19 U/L (ref 15–41)
Albumin: 4.4 g/dL (ref 3.5–5.0)
Alkaline Phosphatase: 73 U/L (ref 38–126)
BILIRUBIN TOTAL: 0.9 mg/dL (ref 0.3–1.2)
BUN: 25 mg/dL — AB (ref 6–20)
CHLORIDE: 110 mmol/L (ref 98–111)
CO2: 21 mmol/L — ABNORMAL LOW (ref 22–32)
Calcium: 10 mg/dL (ref 8.9–10.3)
Creatinine, Ser: 0.7 mg/dL (ref 0.44–1.00)
Glucose, Bld: 89 mg/dL (ref 70–99)
POTASSIUM: 4.1 mmol/L (ref 3.5–5.1)
Sodium: 140 mmol/L (ref 135–145)
Total Protein: 7.3 g/dL (ref 6.5–8.1)

## 2018-01-27 LAB — RAPID URINE DRUG SCREEN, HOSP PERFORMED
Amphetamines: NOT DETECTED
BARBITURATES: NOT DETECTED
Benzodiazepines: NOT DETECTED
COCAINE: NOT DETECTED
Opiates: NOT DETECTED
Tetrahydrocannabinol: NOT DETECTED

## 2018-01-27 LAB — DIFFERENTIAL
ABS IMMATURE GRANULOCYTES: 0.06 10*3/uL (ref 0.00–0.07)
BASOS PCT: 1 %
Basophils Absolute: 0.1 10*3/uL (ref 0.0–0.1)
EOS ABS: 0.2 10*3/uL (ref 0.0–0.5)
EOS PCT: 3 %
Immature Granulocytes: 1 %
LYMPHS ABS: 2.2 10*3/uL (ref 0.7–4.0)
Lymphocytes Relative: 28 %
MONO ABS: 0.6 10*3/uL (ref 0.1–1.0)
MONOS PCT: 8 %
NEUTROS ABS: 4.5 10*3/uL (ref 1.7–7.7)
Neutrophils Relative %: 59 %

## 2018-01-27 LAB — URINALYSIS, ROUTINE W REFLEX MICROSCOPIC
BILIRUBIN URINE: NEGATIVE
GLUCOSE, UA: NEGATIVE mg/dL
Hgb urine dipstick: NEGATIVE
KETONES UR: 5 mg/dL — AB
Nitrite: NEGATIVE
PH: 5 (ref 5.0–8.0)
Protein, ur: NEGATIVE mg/dL
Specific Gravity, Urine: 1.008 (ref 1.005–1.030)

## 2018-01-27 LAB — APTT: aPTT: 29 seconds (ref 24–36)

## 2018-01-27 LAB — CBC
HCT: 43.2 % (ref 36.0–46.0)
Hemoglobin: 14.3 g/dL (ref 12.0–15.0)
MCH: 31.6 pg (ref 26.0–34.0)
MCHC: 33.1 g/dL (ref 30.0–36.0)
MCV: 95.6 fL (ref 80.0–100.0)
PLATELETS: 216 10*3/uL (ref 150–400)
RBC: 4.52 MIL/uL (ref 3.87–5.11)
RDW: 13.6 % (ref 11.5–15.5)
WBC: 7.6 10*3/uL (ref 4.0–10.5)
nRBC: 0 % (ref 0.0–0.2)

## 2018-01-27 LAB — CBG MONITORING, ED: Glucose-Capillary: 76 mg/dL (ref 70–99)

## 2018-01-27 LAB — ETHANOL

## 2018-01-27 LAB — I-STAT BETA HCG BLOOD, ED (MC, WL, AP ONLY): I-stat hCG, quantitative: <5 m[IU]/mL (ref <5)

## 2018-01-27 LAB — I-STAT TROPONIN, ED: TROPONIN I, POC: 0 ng/mL (ref 0.00–0.08)

## 2018-01-27 LAB — PROTIME-INR
INR: 0.99
Prothrombin Time: 13 seconds (ref 11.4–15.2)

## 2018-01-27 MED ORDER — LORAZEPAM 2 MG/ML IJ SOLN: 1.0000 mg | Freq: Once | INTRAMUSCULAR | Status: DC

## 2018-01-27 MED ORDER — KETOROLAC TROMETHAMINE 15 MG/ML IJ SOLN: 15.0000 mg | Freq: Once | INTRAMUSCULAR | Status: DC

## 2018-01-27 NOTE — ED Notes (Signed)
This RN was called by MRI and told pt would be next in about 25 minutes, family updated.

## 2018-01-27 NOTE — Code Documentation (Signed)
57 yo Caucasian female coming from working out with complaints of sudden onset of left sided weakness and sensory decreased that started at 1215. Pt was in a water aerobics class when the symptoms started. Hx of TIA. Family and patient report that they are under significant stress. Neurology evaluated patient and exam not consistent with stroke. Not suspected stroke. Code Stroke cancelled at the bridge. Handoff given to [place], Charity fundraiser.

## 2018-01-27 NOTE — ED Provider Notes (Signed)
  Physical Exam  BP 102/64   Pulse 69   Temp 98.7 F (37.1 C) (Oral)   Resp (!) 21   Ht 5\' 4"  (1.626 m)   Wt 64.7 kg   SpO2 96%   BMI 24.48 kg/m   Physical Exam  ED Course/Procedures     Procedures  MDM  Received care of patient from Dr. Adela Lank and Dr. Dortha Schwalbe at [facility].  Please see their notes for prior history, physical and care.  Briefly this is a 57 year old female with a history of previous posterior frontal infarct, diabetes, bipolar, who presented with concern for paresthesias and sensation for worsening weakness, and abnormal feelings in the left side of her body.  A code stroke had been initiated, however have been canceled by neurology after their evaluation.  It was felt that she had labile presentation and symptoms, and inconsistencies, was noted to have been to the hospital for prior work-ups.  Dr. Luisa Hart had evaluated her and thought potentially her symptoms were related to migraine, or other.  Given her history of stroke, had recommended MRI and feel this is reasonable.  Patient was awaiting MRI at time of my assumption of care.  While waiting for MRI, she reports that she would like to leave.  Discussed with her that while she has been evaluated by neurology, we have not performed any imaging at this time that would rule out stroke, there is a possibility that she could have an undiagnosed CVA at this time.  Discussed risks of her leaving AGAINST MEDICAL ADVICE with undiagnosed CVA, including death, disability, and she states understanding.  She was still like to leave the emergency department at this time and follow-up with her doctors.      Alvira Monday, MD 01/28/18 [phone]

## 2018-01-27 NOTE — ED Notes (Signed)
Pt's CBG result was 76. Informed Kaylah - RN.

## 2018-01-27 NOTE — ED Notes (Signed)
Pt ambulated to the bathroom with assistance from pt's family member. Pt given urine cup.

## 2018-01-27 NOTE — Consult Note (Addendum)
Neurology Consultation  Reason for Consult: Code stroke Referring Physician: Tyrone Nine  CC: Left-sided weakness  History is obtained from: Patient  HPI: Jenna Delgado is a 57 y.o. female history of stroke.  Patient noted at approximately 1215 today while she was working out that she had a sudden onset of left-sided weakness and sensory decrease on the left also.  Apparently the symptoms were also accompanied by a headache.  EMS was called and patient was brought to most [facility] as a code stroke.  On arrival patient was labile, had effort dependent left-sided weakness, and sensory changes with tuning fork on the forehead.  Due to multiple inconsistencies code stroke was canceled.  Looking back at previous notes patient had been to the hospital multiple times with TIA history of stroke.  Previous time she is noted right face tingling and felt that her right side specifically arm was weak.  At that time when asked to smile patient would for the left side of her face to one side.  CT was negative for stroke.  In the past she has had a punctate stroke in the right posterior frontal region however she is also had multiple MRIs that did not show stroke as well.  In the past she has had stroke work-up included TTE and TEE which showed normal EF and no cardiac source of emboli.  Carotid Dopplers which revealed no hemodynamically significant stenosis.  ANA and ESR which have been normal hypercoagulable panel which was negative.  In the past she is also had a loop recorder which revealed no atrial fibrillation.  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  She demonstrated depression and adjustment disorder with anxiety as well.  Most recent consult was done on 11/27 2018 by Dr. Leonel Ramsay.  At  that time her symptoms were episodes that started in her foot and then spread to her arm over the course of multiple minutes, this then turned into numbness and weakness of her right arm and leg.  Exam at that time was inconsistent with give way weakness with the right arm and leg.  It was noted that this weakness was normal strength with distraction.      LKW: 10 07/28/2017 at 1215. tpa given?: no, inconsistent symptoms Premorbid modified Rankin scale (mRS): 0 NIH stroke scale for ROS: A 14 point ROS was performed and is negative except as noted in the HPI.   Past Medical History:  Diagnosis Date  . Asthma   . Mild bipolar disorder ([place])   . Prediabetes   . Seasonal allergic rhinitis   . Stroke ([place])   . Vitamin D deficiency      Family History  Adopted: Yes     Social History:   reports that she has quit smoking. She has never used smokeless tobacco. She reports that she drinks alcohol. She reports that she has current or past drug history. Drug: Marijuana.  Medications No current facility-administered medications for this encounter.   Current Outpatient Medications:  .  Acetylcarnitine HCl (ACETYL L-CARNITINE PO), Take 1 capsule by mouth daily., Disp: , Rfl:  .  Ascorbic Acid (VITAMIN C) 1000 MG tablet, Take 1,000 mg by mouth daily., Disp: , Rfl:  .  aspirin 325 MG tablet, Take 1 tablet (325 mg total) by mouth daily. (Patient not taking: Reported on  02/26/2017), Disp: 30 tablet, Rfl: 0 .  aspirin EC 81 MG tablet, Take 81 mg by mouth daily., Disp: , Rfl:  .  B Complex Vitamins (B COMPLEX PO), Take 1 tablet by mouth daily., Disp: , Rfl:  .  Cholecalciferol (VITAMIN D-1000 MAX ST) 1000 units tablet, Take 1,000 Units by mouth daily., Disp: , Rfl:  .  Coenzyme Q10 (CO Q-10 PO), Take 1 capsule by mouth daily., Disp: , Rfl:  .  fluticasone (FLONASE) 50 MCG/ACT nasal spray, Place 1-2 sprays into both nostrils daily as needed for allergies or rhinitis. , Disp: , Rfl:  .  medium  chain triglycerides (MCT OIL) oil, Take 15 mLs by mouth daily., Disp: , Rfl:  .  Multiple Vitamins-Minerals (ONE-A-DAY WOMENS 50+ ADVANTAGE) TABS, Take 1 tablet by mouth daily., Disp: , Rfl:  .  Omega-3 Fatty Acids (FISH OIL PO), Take 1 capsule by mouth daily., Disp: , Rfl:  .  Pirbuterol Acetate (MAXAIR AUTOHALER IN), Inhale 1-2 puffs into the lungs every 6 (six) hours as needed (for shortness of breath). , Disp: , Rfl:  .  TURMERIC PO, Take 1,000 mg by mouth daily. IN LIQUID FORM, Disp: , Rfl:    Exam: Current vital signs: BP 104/76 (BP Location: Left Arm)   Pulse 71   Temp 98.7 F (37.1 C) (Oral)   Resp 11   Ht '5\' 4"'  (1.626 m)   Wt 64.7 kg   SpO2 96%   BMI 24.48 kg/m  Vital signs in last 24 hours: Temp:  [98.7 F (37.1 C)] 98.7 F (37.1 C) (10/28 1410) Pulse Rate:  [71] 71 (10/28 1410) Resp:  [11-14] 11 (10/28 1410) BP: (104)/(67-76) 104/76 (10/28 1410) SpO2:  [95 %-96 %] 96 % (10/28 1410) Weight:  [64.7 kg] 64.7 kg (10/28 1300)  Physical Exam  Constitutional: Appears well-developed and well-nourished.  Psych: Affect appropriate to situation Eyes: No scleral injection HENT: No OP obstrucion Head: Normocephalic.  Cardiovascular: Normal rate and regular rhythm.  Respiratory: Effort normal, non-labored breathing GI: Soft.  No distension. There is no tenderness.  Skin: WDI  Neuro: Mental Status: Patient is awake, alert, oriented to person, place, month, year, and situation. Patient is able to give a clear and coherent history. No signs of aphasia or neglect Cranial Nerves: II: Visual Fields are full. Pupils are equal, round, and reactive to light.   III,IV, VI: EOMI without ptosis or diploplia.  V: Facial sensation decreased to light touch on the left and splitting midline with tuning fork on the left VII: Facial movement is symmetric when distracted VIII: hearing is intact to voice X: Uvula elevates symmetrically XI: Shoulder shrug is symmetric. XII: tongue is  midline without atrophy or fasciculations.  Motor: Tone is normal. Bulk is normal. 5/5 strength throughout when distracted however when not distracted she showed effort dependent give way weakness on the left arm and leg Sensory: Sensation decreased on the left Deep Tendon Reflexes: 2+ and symmetric in the biceps and patellae.  Plantars: Toes are downgoing bilaterally.  Cerebellar: FNF and HKS are intact bilaterally  Labs I have reviewed labs in epic and the results pertinent to this consultation are:   CBC    Component Value Date/Time   WBC 7.6 01/27/2018 1358   RBC 4.52 01/27/2018 1358   HGB 14.3 01/27/2018 1358   HGB 13.5 03/21/2016 1028   HCT 43.2 01/27/2018 1358   HCT 40.9 03/21/2016 1028   PLT 216 01/27/2018 1358   PLT 233 03/21/2016 1028  MCV 95.6 01/27/2018 1358   MCV 94 03/21/2016 1028   MCH 31.6 01/27/2018 1358   MCHC 33.1 01/27/2018 1358   RDW 13.6 01/27/2018 1358   RDW 14.1 03/21/2016 1028   LYMPHSABS 2.2 01/27/2018 1358   LYMPHSABS 2.1 03/21/2016 1028   MONOABS 0.6 01/27/2018 1358   EOSABS 0.2 01/27/2018 1358   EOSABS 0.4 03/21/2016 1028   BASOSABS 0.1 01/27/2018 1358   BASOSABS 0.1 03/21/2016 1028    CMP     Component Value Date/Time   NA 142 02/26/2017 2004   K 3.7 02/26/2017 2004   CL 104 02/26/2017 2004   CO2 24 02/26/2017 1951   GLUCOSE 99 02/26/2017 2004   BUN 17 02/26/2017 2004   CREATININE 0.70 02/26/2017 2004   CALCIUM 9.7 02/26/2017 1951   PROT 7.1 02/26/2017 1951   ALBUMIN 4.0 02/26/2017 1951   AST 27 02/26/2017 1951   ALT 22 02/26/2017 1951   ALKPHOS 90 02/26/2017 1951   BILITOT 1.0 02/26/2017 1951   GFRNONAA >60 02/26/2017 1951   GFRAA >60 02/26/2017 1951    Lipid Panel     Component Value Date/Time   CHOL 150 09/30/2015 0820   TRIG 111 09/30/2015 0820   HDL 51 09/30/2015 0820   CHOLHDL 2.9 09/30/2015 0820   VLDL 22 09/30/2015 0820   LDLCALC 77 09/30/2015 0820     Imaging I have reviewed the images  obtained:   MRI examination of the brain--pending  Etta Quill PA-C [facility] [phone]  M-F  (9:00 am- 5:00 PM)  01/27/2018, 2:17 PM   I have seen the patient reviewed the note.  Assessment:  57 year old female presented to the hospital with left-sided decreased sensation of the face arm and leg.  She has multiple inconsistencies on her exam, suggestive of embellishment versus nonorganic etiology.  With her history of stroke, I think an MRI is reasonable but I would not pursue further work-up unless this is positive.  Recommendations: -MRI brain - If MRI brain is positive for stroke further stroke work-up is warranted. - Consider treating with Toradol/Ativan for possible comp gated migraine   Roland Rack, MD [facility] [phone]  If 7pm- 7am, please page neurology on call as listed in [place].

## 2018-01-27 NOTE — ED Provider Notes (Signed)
[facility] EMERGENCY DEPARTMENT Provider Note   CSN: [id number] Arrival date & time: 01/27/18  1349     History   Chief Complaint No chief complaint on file.   HPI Jenna Delgado is a 57 y.o. 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  Patient reports that at around 12:50 PM she began noticing paresthesia of the left fourth and fifth finger.  She reports that she felt as if her fingers, face and left leg were " swollen" and very awkward.  At onset, she states she had difficulty with ambulation and experienced headache at the occipital region which was pounding in nature but however denied nausea or vomiting.  She she denies any upper or lower extremity weakness, facial droop, dysarthria, dysphagia, aphasia, chest pain, palpitation.  Per speaking to the husband, he states there were no reports of syncope.   She reports that she sees a neurologist at [facility] and has not had an office visit in a while.   Per chart review, since her initial stroke in 2017 she has had intermittent episodes of memory loss with associated neurological deficits including paresthesia, aphasia, burning speech and difficulty with ambulation.  Previous work-ups with MRI brain with and without contrast only showed chronic right posterior frontal infarct, MRI head and neck reviewed widely patent vasculature.  She had a routine EEG and ambulatory EEG which were all unremarkable.    Past Medical History:  Diagnosis Date  . Asthma   . Mild bipolar disorder (HCC)   . Prediabetes   . Seasonal allergic rhinitis   . Stroke (HCC)   . Vitamin D deficiency     Patient Active Problem List   Diagnosis Date Noted  . TIA (transient ischemic attack) 10/28/2015  . Cerebrovascular accident (CVA) (HCC)   . Abnormal LFTs   .  Cerebral infarction due to unspecified mechanism   . Acute CVA (cerebrovascular accident) (HCC) 09/29/2015  . Abnormal liver function test 09/29/2015  . Asthma 09/29/2015  . Bipolar disorder (HCC) 09/29/2015  . Hyperlipidemia 09/26/2015  . Stroke (cerebrum) (HCC) 09/25/2015  . Hyperglycemia 09/25/2015  . Acute ischemic stroke [facility])     Past Surgical History:  Procedure Laterality Date  . ABDOMINAL HYSTERECTOMY    . BLADDER SUSPENSION    . EP IMPLANTABLE DEVICE N/A 10/03/2015   Procedure: Loop Recorder Insertion;  Surgeon: Marinus Maw, MD;  Location: [facility] INVASIVE CV LAB;  Service: Cardiovascular;  Laterality: N/A;  . TEE WITHOUT CARDIOVERSION N/A 10/03/2015   Procedure: TRANSESOPHAGEAL ECHOCARDIOGRAM (TEE);  Surgeon: Vesta Mixer, MD;  Location[facility] END[facility]COPY;  Service: Cardiovascular;  Laterality: N/A;     OB History   None      Home Medications    Prior to Admission medications   Medication Sig Start Date End Date Taking? Authorizing Provider  Acetylcarnitine HCl (ACETYL L-CARNITINE PO) Take 2 capsules by mouth daily.    Yes [provider]  Ascorbic Acid (VITAMIN C) 1000 MG tablet Take 1,000 mg by mouth daily.   Yes [provider]  aspirin EC 81 MG tablet Take 81 mg by mouth daily.   Yes [provider]  B Complex Vitamins (B COMPLEX PO) Take 1 tablet by mouth daily.   Yes [provider]  Cholecalciferol (VITAMIN D-1000 MAX ST) 1000 units tablet Take 1,000 Units by mouth daily.  Yes [provider]  Coenzyme Q10 (CO Q-10 PO) Take 1 capsule by mouth daily.   Yes [provider]  fluticasone (FLONASE) 50 MCG/ACT nasal spray Place 1-2 sprays into both nostrils daily as needed for allergies or rhinitis.    Yes [provider]  medium chain triglycerides (MCT OIL) oil Take 15 mLs by mouth daily.   Yes [provider]  Melatonin 5 MG TABS Take 5 mg by mouth at bedtime as needed (sleep).   Yes [provider]  Multiple Vitamins-Minerals (ONE-A-DAY WOMENS 50+ ADVANTAGE) TABS Take 1 tablet by mouth daily.   Yes [provider]  Omega-3 Fatty Acids (FISH OIL PO) Take 1 capsule by mouth daily.   Yes [provider]  Pirbuterol Acetate (MAXAIR AUTOHALER IN) Inhale 1-2 puffs into the lungs every 6 (six) hours as needed (for shortness of breath).    Yes [provider]  TURMERIC PO Take 1,000 mg by mouth daily. IN LIQUID FORM   Yes [provider]  aspirin 325 MG tablet Take 1 tablet (325 mg total) by mouth daily. Patient not taking: Reported on 02/26/2017 09/27/15   Catarina Hartshorn, MD    Family History Family History  Adopted: Yes    Social History Social History   Tobacco Use  . Smoking status: Former Games developer  . Smokeless tobacco: Never Used  Substance Use Topics  . Alcohol use: Yes    Comment: occasionally  . Drug use: Yes    Types: Marijuana    Comment: occasionally     Allergies   Benadryl [diphenhydramine hcl]; Sumatriptan; Atorvastatin; Iodine; Ivp dye [iodinated diagnostic agents]; Metrizamide; Copper-containing compounds; Crestor [rosuvastatin calcium]; Resveratrol; Statins; Zocor [simvastatin]; Aminophylline; Demeclocycline; Lamictal [lamotrigine]; Lobster [shellfish allergy]; Metoclopramide; Tetracyclines & related; and Theophyllines   Review of Systems Review of Systems  Constitutional: Negative.   HENT: Negative.   Eyes: Negative.   Respiratory: Negative.   Cardiovascular: Negative.   Gastrointestinal: Negative.   Endocrine: Negative.   Genitourinary: Negative.   Musculoskeletal: Negative.   Skin: Negative.   Neurological: Positive for numbness (left 4th/5th finger, left lower extremity ) and headaches (mild posterior headache). Negative for dizziness, tremors, seizures, syncope, facial asymmetry, speech difficulty, weakness and light-headedness.  Psychiatric/Behavioral: Negative.      Physical Exam Updated Vital  Signs BP 102/64   Pulse 69   Temp 98.7 F (37.1 C) (Oral)   Resp (!) 21   Ht 5\' 4"  (1.626 m)   Wt 64.7 kg   SpO2 96%   BMI 24.48 kg/m   Physical Exam  Constitutional: She is oriented to person, place, and time. She appears well-developed and well-nourished. No distress.  HENT:  Head: Normocephalic and atraumatic.  Eyes: Pupils are equal, round, and reactive to light. Conjunctivae and EOM are normal.  Neck: Normal range of motion. Neck supple.  Cardiovascular: Normal rate, regular rhythm, normal heart sounds and intact distal pulses. Exam reveals no gallop and no friction rub.  No murmur heard. Pulmonary/Chest: Effort normal and breath sounds normal. No stridor. No respiratory distress. She has no wheezes. She has no rales.  Abdominal: Soft. Bowel sounds are normal. There is no tenderness.  Musculoskeletal: Normal range of motion.  Neurological: She is alert and oriented to person, place, and time. No cranial nerve deficit or sensory deficit. Coordination normal.  -Alert and oriented to place and person but not time -Cranial nerves II to XII intact with exception of  1)Decreased sensation to pinprick at the left V1  division of the ophthalmic nerve 2)Decreased sensation to pinprick of the left fourth and fifth finger 3)Abnormal finger-to-nose of the left upper extremity 4)Abnormal heel-to-shin on the left lower extremity  Skin: Skin is warm. She is not diaphoretic.  Psychiatric: She has a normal mood and affect. Her behavior is normal. Judgment and thought content normal.     ED Treatments / Results  Labs (all labs ordered are listed, but only abnormal results are displayed) Labs Reviewed  COMPREHENSIVE METABOLIC PANEL - Abnormal; Notable for the following components:      Result Value   CO2 21 (*)    BUN 25 (*)    All other components within normal limits  I-STAT CHEM 8, ED - Abnormal; Notable for the following components:   BUN 28 (*)    All other components within  normal limits  ETHANOL  PROTIME-INR  APTT  CBC  DIFFERENTIAL  RAPID URINE DRUG SCREEN, HOSP PERFORMED  URINALYSIS, ROUTINE W REFLEX MICROSCOPIC  I-STAT TROPONIN, ED  I-STAT BETA HCG BLOOD, ED ([facility], [facility], [facility] ONLY)  CBG MONITORING, ED    EKG None  Radiology No results found.  Procedures Procedures (including critical care time)  Medications Ordered in ED Medications  ketorolac (TORADOL) 15 MG/ML injection 15 mg (has no administration in time range)  LORazepam (ATIVAN) injection 1 mg (has no administration in time range)     Initial Impression / Assessment and Plan / ED Course  I have reviewed the triage vital signs and the nursing notes.  Pertinent labs & imaging results that were available during my care of the patient were reviewed by me and considered in my medical decision making (see chart for details).   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  Reports that around 12:50 PM, she began experiencing paresthesia of the left fourth and fifth finger and sensation of swelling face as well as left lower extremity.  Also reports of difficulty with ambulation as well as occipital headache.  However denies worsening weakness of the upper or lower extremity, facial droop, dysarthria, dysphagia, aphasia, chest pain, palpitation.   Code stroke was called and patient was evaluated by neurology on arrival who advised the patient to undergo an MRI of the brain and pending official read  Since her initial stroke in 2017 she has had intermittent episodes of memory loss with associated neurological deficits including paresthesia, aphasia, burning speech and difficulty with ambulation.  Previous work-ups with MRI brain with and without contrast only showed chronic right posterior frontal infarct, MRI head and neck reviewed widely  patent vasculature.  She had a routine EEG and ambulatory EEG which were all unremarkable.   CMP was unremarkable, CBC with differential unremarkable, i-STAT troponin was unremarkable.    Final Clinical Impressions(s) / ED Diagnoses   Final diagnoses:  Paresthesia  Personal history of stroke with residual effects    ED Discharge Orders    None       Yvette Rack, MD 01/27/18 1550    Melene Plan, DO 02/03/18 [id number]

## 2018-01-27 NOTE — ED Notes (Signed)
Pt aware that she is leaving AMA.  Pt instructed to follow up with her PCP and return immediately to the ED for new or worsening symptoms.

## 2018-02-03 DIAGNOSIS — F4310 Post-traumatic stress disorder, unspecified: Secondary | ICD-10-CM | POA: Diagnosis not present

## 2018-02-11 DIAGNOSIS — I6930 Unspecified sequelae of cerebral infarction: Secondary | ICD-10-CM | POA: Diagnosis not present

## 2018-02-17 ENCOUNTER — Ambulatory Visit (INDEPENDENT_AMBULATORY_CARE_PROVIDER_SITE_OTHER): Payer: BLUE CROSS/BLUE SHIELD

## 2018-02-17 DIAGNOSIS — I639 Cerebral infarction, unspecified: Secondary | ICD-10-CM

## 2018-02-17 DIAGNOSIS — F4310 Post-traumatic stress disorder, unspecified: Secondary | ICD-10-CM | POA: Diagnosis not present

## 2018-02-17 NOTE — Progress Notes (Signed)
Carelink Summary Report / Loop Recorder 

## 2018-02-19 DIAGNOSIS — I6930 Unspecified sequelae of cerebral infarction: Secondary | ICD-10-CM | POA: Diagnosis not present

## 2018-02-19 DIAGNOSIS — R29818 Other symptoms and signs involving the nervous system: Secondary | ICD-10-CM | POA: Diagnosis not present

## 2018-03-17 DIAGNOSIS — F4310 Post-traumatic stress disorder, unspecified: Secondary | ICD-10-CM | POA: Diagnosis not present

## 2018-03-24 ENCOUNTER — Ambulatory Visit (INDEPENDENT_AMBULATORY_CARE_PROVIDER_SITE_OTHER): Payer: BLUE CROSS/BLUE SHIELD

## 2018-03-24 DIAGNOSIS — I639 Cerebral infarction, unspecified: Secondary | ICD-10-CM | POA: Diagnosis not present

## 2018-03-24 LAB — CUP PACEART REMOTE DEVICE CHECK
Implantable Pulse Generator Implant Date: 20170703
MDC IDC SESS DTM: 20191221134228

## 2018-03-24 NOTE — Progress Notes (Signed)
Carelink Summary Report / Loop Recorder 

## 2018-04-06 LAB — CUP PACEART REMOTE DEVICE CHECK
Date Time Interrogation Session: 20191118124208
MDC IDC PG IMPLANT DT: 20170703

## 2018-04-18 ENCOUNTER — Encounter: Payer: Self-pay | Admitting: Internal Medicine

## 2018-04-24 ENCOUNTER — Ambulatory Visit (INDEPENDENT_AMBULATORY_CARE_PROVIDER_SITE_OTHER): Payer: BLUE CROSS/BLUE SHIELD

## 2018-04-24 DIAGNOSIS — I639 Cerebral infarction, unspecified: Secondary | ICD-10-CM

## 2018-04-25 NOTE — Progress Notes (Signed)
Carelink Summary Report / Loop Recorder 

## 2018-04-27 LAB — CUP PACEART REMOTE DEVICE CHECK
Implantable Pulse Generator Implant Date: 20170703
MDC IDC SESS DTM: 20200123144037

## 2018-05-01 DIAGNOSIS — F4310 Post-traumatic stress disorder, unspecified: Secondary | ICD-10-CM | POA: Diagnosis not present

## 2018-05-05 DIAGNOSIS — F4310 Post-traumatic stress disorder, unspecified: Secondary | ICD-10-CM | POA: Diagnosis not present

## 2018-05-06 ENCOUNTER — Encounter: Payer: Self-pay | Admitting: Internal Medicine

## 2018-05-06 ENCOUNTER — Ambulatory Visit: Payer: BLUE CROSS/BLUE SHIELD | Admitting: Internal Medicine

## 2018-05-06 VITALS — BP 122/64 | HR 78 | Ht 64.0 in | Wt 143.0 lb

## 2018-05-06 DIAGNOSIS — I639 Cerebral infarction, unspecified: Secondary | ICD-10-CM

## 2018-05-06 NOTE — Progress Notes (Signed)
HPI Jenna Delgado returns today for ongoing followup. She is a pleasant 57yo woman with a cryptogenic stroke, s/p insertion of an ILR. The patient has not had any atrial fib and her ILR has reached end of service. She denies palpitations, chest pain, syncope or peripheral edema. She has lost 50 lbs in the last 2 years.  Allergies  Allergen Reactions  . Benadryl [Diphenhydramine Hcl] Palpitations and Other (See Comments)    Heart pounding, panic attacks  . Sumatriptan Palpitations and Other (See Comments)    Heart pounding and panic attacks  . Atorvastatin Other (See Comments)    Muscle aches and memory issues  . Iodine Hives    IVP test problems; was told to "never use this again" (as a teenager)  . Ivp Dye [Iodinated Diagnostic Agents] Hives and Other (See Comments)    IVP test problems  . Metrizamide Hives    IVP test problems  . Copper-Containing Compounds Other (See Comments)    Makes joints hurt  . Crestor [Rosuvastatin Calcium] Other (See Comments)    Muscle aches and memory issues  . Resveratrol Other (See Comments)    Muscle aches and memory & mental status changes  . Statins Other (See Comments)    Muscle aches and memory changes  . Zocor [Simvastatin] Other (See Comments)    Muscle aches and memory issues  . Aminophylline Rash    Target rash  . Demeclocycline Rash and Other (See Comments)    Blisters down the throat  . Lamictal [Lamotrigine] Rash  . Lobster [Shellfish Allergy] Hives and Itching  . Metoclopramide Anxiety and Palpitations    Causes BAD anxiety  . Tetracyclines & Related Swelling and Rash    Blisters down the throat and the tongue swells  . Theophyllines Rash    Target rash     Current Outpatient Medications  Medication Sig Dispense Refill  . Acetylcarnitine HCl (ACETYL L-CARNITINE PO) Take 2 capsules by mouth daily.     . Ascorbic Acid (VITAMIN C) 1000 MG tablet Take 1,000 mg by mouth daily.    [name]. aspirin EC 81 MG tablet Take 81 mg by  mouth daily.    . B Complex Vitamins (B COMPLEX PO) Take 1 tablet by mouth daily.    . Cholecalciferol (VITAMIN D-1000 MAX ST) 1000 units tablet Take 1,000 Units by mouth daily.    . Coenzyme Q10 (CO Q-10 PO) Take 1 capsule by mouth daily.    . fluticasone (FLONASE) 50 MCG/ACT nasal spray Place 1-2 sprays into both nostrils daily as needed for allergies or rhinitis.     . medium chain triglycerides (MCT OIL) oil Take 15 mLs by mouth daily.    . Melatonin 5 MG TABS Take 5 mg by mouth at bedtime as needed (sleep).    . Multiple Vitamins-Minerals (ONE-A-DAY WOMENS 50+ ADVANTAGE) TABS Take 1 tablet by mouth daily.    . Omega-3 Fatty Acids (FISH OIL PO) Take 1 capsule by mouth daily.    . Pirbuterol Acetate (MAXAIR AUTOHALER IN) Inhale 1-2 puffs into the lungs every 6 (six) hours as needed (for shortness of breath).     . TURMERIC PO Take 1,000 mg by mouth daily. IN LIQUID FORM     No current facility-administered medications for this visit.      Past Medical History:  Diagnosis Date  . Asthma   . Mild bipolar disorder (HCC)   . Prediabetes   . Seasonal allergic rhinitis   . Stroke (  HCC)   . Vitamin D deficiency     ROS:   All systems reviewed and negative except as noted in the HPI.   Past Surgical History:  Procedure Laterality Date  . ABDOMINAL HYSTERECTOMY    . BLADDER SUSPENSION    . EP IMPLANTABLE DEVICE N/A 10/03/2015   Procedure: Loop Recorder Insertion;  Surgeon: Marinus MawGregg W , MD;  Location: [facility] INVASIVE CV LAB;  Service: Cardiovascular;  Laterality: N/A;  . TEE WITHOUT CARDIOVERSION N/A 10/03/2015   Procedure: TRANSESOPHAGEAL ECHOCARDIOGRAM (TEE);  Surgeon: Vesta MixerPhilip J Nahser, MD;  Location[facility] ENDOSCOPY;  Service: Cardiovascular;  Laterality: N/A;     Family History  Adopted: Yes     Social History   Socioeconomic History  . Marital status: Married    Spouse name: Not on file  . Number of children: Not on file  . Years of education: Not on file  . Highest  education level: Not on file  Occupational History  . Not on file  Social Needs  . Financial resource strain: Not on file  . Food insecurity:    Worry: Not on file    Inability: Not on file  . Transportation needs:    Medical: Not on file    Non-medical: Not on file  Tobacco Use  . Smoking status: Former Games developermoker  . Smokeless tobacco: Never Used  Substance and Sexual Activity  . Alcohol use: Yes    Comment: occasionally  . Drug use: Yes    Types: Marijuana    Comment: occasionally  . Sexual activity: Not on file  Lifestyle  . Physical activity:    Days per week: Not on file    Minutes per session: Not on file  . Stress: Not on file  Relationships  . Social connections:    Talks on phone: Not on file    Gets together: Not on file    Attends religious service: Not on file    Active member of club or organization: Not on file    Attends meetings of clubs or organizations: Not on file    Relationship status: Not on file  . Intimate partner violence:    Fear of current or ex partner: Not on file    Emotionally abused: Not on file    Physically abused: Not on file    Forced sexual activity: Not on file  Other Topics Concern  . Not on file  Social History Narrative   Drinks very little caffeine      BP 122/64   Pulse 78   Ht 5\' 4"  (1.626 m)   Wt 143 lb (64.9 kg)   SpO2 95%   BMI 24.55 kg/m   Physical Exam:  Well appearing middle aged woman, NAD HEENT: Unremarkable Neck:  No JVD, no thyromegally Lymphatics:  No adenopathy Back:  No CVA tenderness Lungs:  Clear with no wheezes HEART:  Regular rate rhythm, no murmurs, no rubs, no clicks Abd:  soft, positive bowel sounds, no organomegally, no rebound, no guarding Ext:  2 plus pulses, no edema, no cyanosis, no clubbing Skin:  No rashes no nodules Neuro:  CN II through XII intact, motor grossly intact   DEVICE  Normal device function.  See PaceArt for details. End of service  Assess/Plan: 1. Cryptogenic  stroke - still no etiology and her ILR has reached end of service. I have discussed the indications for removal of her ILR and she wishes to proceed. We will schedule. 2. Dyslipidemia - she is doing  well on dietary control.  Jenna Delgado.D.

## 2018-05-06 NOTE — Patient Instructions (Addendum)
Medication Instructions:  Your physician recommends that you continue on your current medications as directed. Please refer to the Current Medication list given to you today.  Labwork: None ordered.  Testing/Procedures: None ordered.  Follow-Up: Your physician wants you to follow-up in: when your loop recorder removal is pre approved I will call you with an office visit to have it removed.  Any Other Special Instructions Will Be Listed Below (If Applicable).  If you need a refill on your cardiac medications before your next appointment, please call your pharmacy.

## 2018-05-08 LAB — CUP PACEART INCLINIC DEVICE CHECK
Implantable Pulse Generator Implant Date: 20170703
MDC IDC SESS DTM: 20200204163603

## 2018-05-14 DIAGNOSIS — R4689 Other symptoms and signs involving appearance and behavior: Secondary | ICD-10-CM | POA: Diagnosis not present

## 2018-05-14 DIAGNOSIS — R4189 Other symptoms and signs involving cognitive functions and awareness: Secondary | ICD-10-CM | POA: Diagnosis not present

## 2018-05-19 DIAGNOSIS — F4310 Post-traumatic stress disorder, unspecified: Secondary | ICD-10-CM | POA: Diagnosis not present

## 2018-05-27 ENCOUNTER — Ambulatory Visit: Payer: BLUE CROSS/BLUE SHIELD | Admitting: Internal Medicine

## 2018-05-27 ENCOUNTER — Ambulatory Visit (INDEPENDENT_AMBULATORY_CARE_PROVIDER_SITE_OTHER): Payer: BLUE CROSS/BLUE SHIELD | Admitting: *Deleted

## 2018-05-27 ENCOUNTER — Encounter: Payer: Self-pay | Admitting: Internal Medicine

## 2018-05-27 VITALS — BP 104/66 | HR 64 | Ht 64.0 in | Wt 140.2 lb

## 2018-05-27 DIAGNOSIS — I639 Cerebral infarction, unspecified: Secondary | ICD-10-CM

## 2018-05-27 LAB — CUP PACEART REMOTE DEVICE CHECK
Date Time Interrogation Session: 20200225150828
Implantable Pulse Generator Implant Date: 20170703

## 2018-05-27 NOTE — Patient Instructions (Addendum)
Medication Instructions:  Your physician recommends that you continue on your current medications as directed. Please refer to the Current Medication list given to you today.  Labwork: None ordered.  Testing/Procedures: None ordered.  Follow-Up:  Your physician wants you to follow-up in: as needed with Dr. Taylor.       Implantable Loop Recorder Placement, Care After Refer to this sheet in the next few weeks. These instructions provide you with information about caring for yourself after your procedure. Your health care provider may also give you more specific instructions. Your treatment has been planned according to current medical practices, but problems sometimes occur. Call your health care provider if you have any problems or questions after your procedure. What can I expect after the procedure? After the procedure, it is common to have:  Soreness or pain near the cut from surgery (incision).  Some swelling or bruising near the incision.  Follow these instructions at home:  Medicines  Take over-the-counter and prescription medicines only as told by your health care provider.  If you were prescribed an antibiotic medicine, take it as told by your health care provider. Do not stop taking the antibiotic even if you start to feel better.  Bathing Do not take baths, swim, or use a hot tub until your health care provider approves. You may shower 24 hours after removal of your monitor.  Incision care  Follow instructions from your health care provider about how to take care of your incision. Make sure you: ? Remove your top dressing after 24 hours (before you shower) ? Leave stitches (sutures), skin glue, or adhesive strips in place. These skin closures may need to stay in place for 2 weeks or longer. If adhesive strip edges start to loosen and curl up, you may trim the loose edges. Do not remove adhesive strips completely unless your health care provider tells you to do  that.  Check your incision area every day for signs of infection. Check for: ? More redness, swelling, or pain. ? Fluid or blood. ? Warmth. ? Pus or a bad smell.  Contact a health care provider if:  You have more redness, swelling, or pain around your incision.  You have more fluid or blood coming from your incision.  Your incision feels warm to the touch.  You have pus or a bad smell coming from your incision.  You have a fever.  You have pain that is not relieved by your pain medicine.  You have triggered your device because of fainting (syncope) or because of a heartbeat that feels like it is racing, slow, fluttering, or skipping (palpitations).   

## 2018-05-27 NOTE — Progress Notes (Signed)
HPI The patient returns today for ongoing followup of her cryptogenic stroke and to consider removal of her ILR. She has done well in the interim. She has reached end of service on her ILR. She would like to have the device removed. It bothers her now that she has lost 50 lbs. Allergies  Allergen Reactions  . Benadryl [Diphenhydramine Hcl] Palpitations and Other (See Comments)    Heart pounding, panic attacks  . Sumatriptan Palpitations and Other (See Comments)    Heart pounding and panic attacks  . Atorvastatin Other (See Comments)    Muscle aches and memory issues  . Iodine Hives    IVP test problems; was told to "never use this again" (as a teenager)  . Ivp Dye [Iodinated Diagnostic Agents] Hives and Other (See Comments)    IVP test problems  . Metrizamide Hives    IVP test problems  . Copper-Containing Compounds Other (See Comments)    Makes joints hurt  . Crestor [Rosuvastatin Calcium] Other (See Comments)    Muscle aches and memory issues  . Resveratrol Other (See Comments)    Muscle aches and memory & mental status changes  . Statins Other (See Comments)    Muscle aches and memory changes  . Zocor [Simvastatin] Other (See Comments)    Muscle aches and memory issues  . Aminophylline Rash    Target rash  . Demeclocycline Rash and Other (See Comments)    Blisters down the throat  . Lamictal [Lamotrigine] Rash  . Lobster [Shellfish Allergy] Hives and Itching  . Metoclopramide Anxiety and Palpitations    Causes BAD anxiety  . Tetracyclines & Related Swelling and Rash    Blisters down the throat and the tongue swells  . Theophyllines Rash    Target rash     Current Outpatient Medications  Medication Sig Dispense Refill  . Acetylcarnitine HCl (ACETYL L-CARNITINE PO) Take 2 capsules by mouth daily.     . Ascorbic Acid (VITAMIN C) 1000 MG tablet Take 1,000 mg by mouth daily.    [name] aspirin EC 81 MG tablet Take 81 mg by mouth daily.    . B Complex Vitamins (B  COMPLEX PO) Take 1 tablet by mouth daily.    . Cholecalciferol (VITAMIN D-1000 MAX ST) 1000 units tablet Take 1,000 Units by mouth daily.    . Coenzyme Q10 (CO Q-10 PO) Take 1 capsule by mouth daily.    . fluticasone (FLONASE) 50 MCG/ACT nasal spray Place 1-2 sprays into both nostrils daily as needed for allergies or rhinitis.     . medium chain triglycerides (MCT OIL) oil Take 15 mLs by mouth daily.    . Melatonin 5 MG TABS Take 5 mg by mouth at bedtime as needed (sleep).    . Multiple Vitamins-Minerals (ONE-A-DAY WOMENS 50+ ADVANTAGE) TABS Take 1 tablet by mouth daily.    . Omega-3 Fatty Acids (FISH OIL PO) Take 1 capsule by mouth daily.    . Pirbuterol Acetate (MAXAIR AUTOHALER IN) Inhale 1-2 puffs into the lungs every 6 (six) hours as needed (for shortness of breath).     . TURMERIC PO Take 1,000 mg by mouth daily. IN LIQUID FORM     No current facility-administered medications for this visit.      Past Medical History:  Diagnosis Date  . Asthma   . Mild bipolar disorder (HCC)   . Prediabetes   . Seasonal allergic rhinitis   . Stroke (HCC)   . Vitamin D  deficiency     ROS:   All systems reviewed and negative except as noted in the HPI.   Past Surgical History:  Procedure Laterality Date  . ABDOMINAL HYSTERECTOMY    . BLADDER SUSPENSION    . EP IMPLANTABLE DEVICE N/A 10/03/2015   Procedure: Loop Recorder Insertion;  Surgeon: Marinus Maw, MD;  Location: [facility];  Service: Cardiovascular;  Laterality: N/A;  . TEE WITHOUT CARDIOVERSION N/A 10/03/2015   Procedure: TRANSESOPHAGEAL ECHOCARDIOGRAM (TEE);  Surgeon: Vesta Mixer, MD;  Location: [facility] ENDOSCOPY;  Service: Cardiovascular;  Laterality: N/A;     Family History  Adopted: Yes     Social History   Socioeconomic History  . Marital status: Married    Spouse name: Not on file  . Number of children: Not on file  . Years of education: Not on file  . Highest education level: Not on file  Occupational  History  . Not on file  Social Needs  . Financial resource strain: Not on file  . Food insecurity:    Worry: Not on file    Inability: Not on file  . Transportation needs:    Medical: Not on file    Non-medical: Not on file  Tobacco Use  . Smoking status: Former Games developer  . Smokeless tobacco: Never Used  Substance and Sexual Activity  . Alcohol use: Yes    Comment: occasionally  . Drug use: Yes    Types: Marijuana    Comment: occasionally  . Sexual activity: Not on file  Lifestyle  . Physical activity:    Days per week: Not on file    Minutes per session: Not on file  . Stress: Not on file  Relationships  . Social connections:    Talks on phone: Not on file    Gets together: Not on file    Attends religious service: Not on file    Active member of club or organization: Not on file    Attends meetings of clubs or organizations: Not on file    Relationship status: Not on file  . Intimate partner violence:    Fear of current or ex partner: Not on file    Emotionally abused: Not on file    Physically abused: Not on file    Forced sexual activity: Not on file  Other Topics Concern  . Not on file  Social History Narrative   Drinks very little caffeine      BP 104/66   Pulse 64   Ht 5\' 4"  (1.626 m)   Wt 140 lb 3.2 oz (63.6 kg)   SpO2 97%   BMI 24.07 kg/m   Physical Exam:  Well appearing 58 yo woman, NAD HEENT: Unremarkable Neck:  No JVD, no thyromegally Lymphatics:  No adenopathy Back:  No CVA tenderness Lungs:  Clear with no wheezes HEART:  Regular rate rhythm, no murmurs, no rubs, no clicks Abd:  soft, positive bowel sounds, no organomegally, no rebound, no guarding Ext:  2 plus pulses, no edema, no cyanosis, no clubbing Skin:  No rashes no nodules Neuro:  CN II through XII intact, motor grossly intact   Assess/Plan: 1. Cryptogenic stroke 2. ILR , now at end of service 3. Obesity with a 50 lb weight loss  Description of the procedure:  Procedure  performed: ILR removal  Preoperative diagnosis: cryptogenic stroke  Postoperative diagnosis: same  After informed consent was obtained, the patient was prepped and draped in a sterile fashion. 10 cc of  lidocaine was infiltrated and a 1 cm incision carried out. Blunt and sharp dissection were utilized to free up the implantable loop recorder. The ILR was removed with gentle traction. Benzoin and steristrips were painted on the skin.  Complications: none immediately  Conclusion: successful removal of a medtronic IRL in a patient in who the device has reached end of service and she desired to have it removed.  Leonia ReevesGregg Taylor,M.D.

## 2018-05-28 LAB — CUP PACEART INCLINIC DEVICE CHECK
Implantable Pulse Generator Implant Date: 20170703
MDC IDC SESS DTM: 20200226075307

## 2018-06-02 DIAGNOSIS — F4310 Post-traumatic stress disorder, unspecified: Secondary | ICD-10-CM | POA: Diagnosis not present

## 2018-06-04 NOTE — Progress Notes (Signed)
Carelink Summary Report / Loop Recorder 

## 2018-06-16 DIAGNOSIS — F4310 Post-traumatic stress disorder, unspecified: Secondary | ICD-10-CM | POA: Diagnosis not present

## 2018-06-25 DIAGNOSIS — J309 Allergic rhinitis, unspecified: Secondary | ICD-10-CM | POA: Diagnosis not present

## 2018-06-25 DIAGNOSIS — J4520 Mild intermittent asthma, uncomplicated: Secondary | ICD-10-CM | POA: Diagnosis not present

## 2018-06-25 DIAGNOSIS — J069 Acute upper respiratory infection, unspecified: Secondary | ICD-10-CM | POA: Diagnosis not present

## 2018-06-27 DIAGNOSIS — J069 Acute upper respiratory infection, unspecified: Secondary | ICD-10-CM | POA: Diagnosis not present

## 2018-06-30 ENCOUNTER — Other Ambulatory Visit: Payer: Self-pay

## 2018-06-30 ENCOUNTER — Encounter: Payer: BLUE CROSS/BLUE SHIELD | Admitting: *Deleted

## 2018-07-09 DIAGNOSIS — F4310 Post-traumatic stress disorder, unspecified: Secondary | ICD-10-CM | POA: Diagnosis not present

## 2018-07-17 DIAGNOSIS — F418 Other specified anxiety disorders: Secondary | ICD-10-CM | POA: Diagnosis not present

## 2018-07-17 DIAGNOSIS — J069 Acute upper respiratory infection, unspecified: Secondary | ICD-10-CM | POA: Diagnosis not present

## 2018-07-21 DIAGNOSIS — F4310 Post-traumatic stress disorder, unspecified: Secondary | ICD-10-CM | POA: Diagnosis not present

## 2018-07-28 DIAGNOSIS — F4310 Post-traumatic stress disorder, unspecified: Secondary | ICD-10-CM | POA: Diagnosis not present

## 2018-08-07 DIAGNOSIS — Z03818 Encounter for observation for suspected exposure to other biological agents ruled out: Secondary | ICD-10-CM | POA: Diagnosis not present

## 2018-08-11 DIAGNOSIS — F4310 Post-traumatic stress disorder, unspecified: Secondary | ICD-10-CM | POA: Diagnosis not present

## 2018-08-12 DIAGNOSIS — G43909 Migraine, unspecified, not intractable, without status migrainosus: Secondary | ICD-10-CM | POA: Diagnosis not present

## 2018-08-12 DIAGNOSIS — R4689 Other symptoms and signs involving appearance and behavior: Secondary | ICD-10-CM | POA: Diagnosis not present

## 2018-08-12 DIAGNOSIS — R4189 Other symptoms and signs involving cognitive functions and awareness: Secondary | ICD-10-CM | POA: Diagnosis not present

## 2018-08-26 DIAGNOSIS — F4310 Post-traumatic stress disorder, unspecified: Secondary | ICD-10-CM | POA: Diagnosis not present

## 2018-09-15 DIAGNOSIS — F4310 Post-traumatic stress disorder, unspecified: Secondary | ICD-10-CM | POA: Diagnosis not present

## 2018-09-29 DIAGNOSIS — F4310 Post-traumatic stress disorder, unspecified: Secondary | ICD-10-CM | POA: Diagnosis not present

## 2018-10-06 DIAGNOSIS — F4310 Post-traumatic stress disorder, unspecified: Secondary | ICD-10-CM | POA: Diagnosis not present

## 2018-10-07 DIAGNOSIS — B349 Viral infection, unspecified: Secondary | ICD-10-CM | POA: Diagnosis not present

## 2018-10-15 DIAGNOSIS — F4310 Post-traumatic stress disorder, unspecified: Secondary | ICD-10-CM | POA: Diagnosis not present

## 2018-10-16 DIAGNOSIS — W57XXXA Bitten or stung by nonvenomous insect and other nonvenomous arthropods, initial encounter: Secondary | ICD-10-CM | POA: Diagnosis not present

## 2018-10-16 DIAGNOSIS — L0390 Cellulitis, unspecified: Secondary | ICD-10-CM | POA: Diagnosis not present

## 2018-10-16 DIAGNOSIS — S90861A Insect bite (nonvenomous), right foot, initial encounter: Secondary | ICD-10-CM | POA: Diagnosis not present

## 2018-10-23 DIAGNOSIS — F4310 Post-traumatic stress disorder, unspecified: Secondary | ICD-10-CM | POA: Diagnosis not present

## 2018-11-06 DIAGNOSIS — F4310 Post-traumatic stress disorder, unspecified: Secondary | ICD-10-CM | POA: Diagnosis not present

## 2018-11-10 DIAGNOSIS — R300 Dysuria: Secondary | ICD-10-CM | POA: Diagnosis not present

## 2018-11-10 DIAGNOSIS — R002 Palpitations: Secondary | ICD-10-CM | POA: Diagnosis not present

## 2018-11-19 DIAGNOSIS — F4310 Post-traumatic stress disorder, unspecified: Secondary | ICD-10-CM | POA: Diagnosis not present

## 2018-11-26 DIAGNOSIS — F4310 Post-traumatic stress disorder, unspecified: Secondary | ICD-10-CM | POA: Diagnosis not present

## 2018-12-26 DIAGNOSIS — F4310 Post-traumatic stress disorder, unspecified: Secondary | ICD-10-CM | POA: Diagnosis not present

## 2019-01-05 DIAGNOSIS — R079 Chest pain, unspecified: Secondary | ICD-10-CM | POA: Diagnosis not present

## 2019-01-05 DIAGNOSIS — Z79899 Other long term (current) drug therapy: Secondary | ICD-10-CM | POA: Diagnosis not present

## 2019-01-05 DIAGNOSIS — F4310 Post-traumatic stress disorder, unspecified: Secondary | ICD-10-CM | POA: Diagnosis not present

## 2019-01-05 DIAGNOSIS — R0603 Acute respiratory distress: Secondary | ICD-10-CM | POA: Diagnosis not present

## 2019-01-05 DIAGNOSIS — R0902 Hypoxemia: Secondary | ICD-10-CM | POA: Diagnosis not present

## 2019-01-05 DIAGNOSIS — R0602 Shortness of breath: Secondary | ICD-10-CM | POA: Diagnosis not present

## 2019-01-05 DIAGNOSIS — Z20828 Contact with and (suspected) exposure to other viral communicable diseases: Secondary | ICD-10-CM | POA: Diagnosis not present

## 2019-01-05 DIAGNOSIS — R0789 Other chest pain: Secondary | ICD-10-CM | POA: Diagnosis not present

## 2019-01-05 DIAGNOSIS — Z87891 Personal history of nicotine dependence: Secondary | ICD-10-CM | POA: Diagnosis not present

## 2019-01-05 DIAGNOSIS — R0600 Dyspnea, unspecified: Secondary | ICD-10-CM | POA: Diagnosis not present

## 2019-01-05 DIAGNOSIS — R0682 Tachypnea, not elsewhere classified: Secondary | ICD-10-CM | POA: Diagnosis not present

## 2019-01-05 DIAGNOSIS — Z5181 Encounter for therapeutic drug level monitoring: Secondary | ICD-10-CM | POA: Diagnosis not present

## 2019-01-05 DIAGNOSIS — R05 Cough: Secondary | ICD-10-CM | POA: Diagnosis not present

## 2019-01-05 DIAGNOSIS — Z7982 Long term (current) use of aspirin: Secondary | ICD-10-CM | POA: Diagnosis not present

## 2019-01-12 DIAGNOSIS — F4310 Post-traumatic stress disorder, unspecified: Secondary | ICD-10-CM | POA: Diagnosis not present

## 2019-01-19 DIAGNOSIS — F4310 Post-traumatic stress disorder, unspecified: Secondary | ICD-10-CM | POA: Diagnosis not present

## 2019-01-26 DIAGNOSIS — F4310 Post-traumatic stress disorder, unspecified: Secondary | ICD-10-CM | POA: Diagnosis not present

## 2019-02-09 DIAGNOSIS — F4310 Post-traumatic stress disorder, unspecified: Secondary | ICD-10-CM | POA: Diagnosis not present

## 2019-02-13 DIAGNOSIS — I6930 Unspecified sequelae of cerebral infarction: Secondary | ICD-10-CM | POA: Diagnosis not present

## 2019-03-05 DIAGNOSIS — F4310 Post-traumatic stress disorder, unspecified: Secondary | ICD-10-CM | POA: Diagnosis not present

## 2019-03-10 DIAGNOSIS — F4310 Post-traumatic stress disorder, unspecified: Secondary | ICD-10-CM | POA: Diagnosis not present

## 2019-03-17 DIAGNOSIS — F4310 Post-traumatic stress disorder, unspecified: Secondary | ICD-10-CM | POA: Diagnosis not present

## 2019-03-24 DIAGNOSIS — F4310 Post-traumatic stress disorder, unspecified: Secondary | ICD-10-CM | POA: Diagnosis not present

## 2019-03-25 DIAGNOSIS — R350 Frequency of micturition: Secondary | ICD-10-CM | POA: Diagnosis not present

## 2019-04-06 DIAGNOSIS — F4310 Post-traumatic stress disorder, unspecified: Secondary | ICD-10-CM | POA: Diagnosis not present

## 2019-04-13 DIAGNOSIS — F4310 Post-traumatic stress disorder, unspecified: Secondary | ICD-10-CM | POA: Diagnosis not present

## 2019-07-15 DIAGNOSIS — F4310 Post-traumatic stress disorder, unspecified: Secondary | ICD-10-CM | POA: Diagnosis not present

## 2019-07-27 DIAGNOSIS — H0015 Chalazion left lower eyelid: Secondary | ICD-10-CM | POA: Diagnosis not present

## 2019-07-29 DIAGNOSIS — F4310 Post-traumatic stress disorder, unspecified: Secondary | ICD-10-CM | POA: Diagnosis not present

## 2019-08-03 DIAGNOSIS — H0015 Chalazion left lower eyelid: Secondary | ICD-10-CM | POA: Diagnosis not present

## 2019-08-03 DIAGNOSIS — H524 Presbyopia: Secondary | ICD-10-CM | POA: Diagnosis not present

## 2019-08-05 DIAGNOSIS — F4310 Post-traumatic stress disorder, unspecified: Secondary | ICD-10-CM | POA: Diagnosis not present

## 2019-08-18 DIAGNOSIS — F4310 Post-traumatic stress disorder, unspecified: Secondary | ICD-10-CM | POA: Diagnosis not present

## 2019-08-27 DIAGNOSIS — F4310 Post-traumatic stress disorder, unspecified: Secondary | ICD-10-CM | POA: Diagnosis not present

## 2019-10-12 DIAGNOSIS — F4310 Post-traumatic stress disorder, unspecified: Secondary | ICD-10-CM | POA: Diagnosis not present

## 2019-10-19 DIAGNOSIS — F4310 Post-traumatic stress disorder, unspecified: Secondary | ICD-10-CM | POA: Diagnosis not present

## 2019-11-17 DIAGNOSIS — F4321 Adjustment disorder with depressed mood: Secondary | ICD-10-CM | POA: Diagnosis not present

## 2019-11-17 DIAGNOSIS — F332 Major depressive disorder, recurrent severe without psychotic features: Secondary | ICD-10-CM | POA: Diagnosis not present

## 2019-11-19 DIAGNOSIS — F4310 Post-traumatic stress disorder, unspecified: Secondary | ICD-10-CM | POA: Diagnosis not present

## 2019-11-25 DIAGNOSIS — F332 Major depressive disorder, recurrent severe without psychotic features: Secondary | ICD-10-CM | POA: Diagnosis not present

## 2019-11-27 DIAGNOSIS — F332 Major depressive disorder, recurrent severe without psychotic features: Secondary | ICD-10-CM | POA: Diagnosis not present

## 2019-11-30 DIAGNOSIS — F4310 Post-traumatic stress disorder, unspecified: Secondary | ICD-10-CM | POA: Diagnosis not present

## 2019-12-02 DIAGNOSIS — F332 Major depressive disorder, recurrent severe without psychotic features: Secondary | ICD-10-CM | POA: Diagnosis not present

## 2019-12-04 DIAGNOSIS — F332 Major depressive disorder, recurrent severe without psychotic features: Secondary | ICD-10-CM | POA: Diagnosis not present

## 2019-12-08 DIAGNOSIS — F332 Major depressive disorder, recurrent severe without psychotic features: Secondary | ICD-10-CM | POA: Diagnosis not present

## 2019-12-10 DIAGNOSIS — F332 Major depressive disorder, recurrent severe without psychotic features: Secondary | ICD-10-CM | POA: Diagnosis not present

## 2019-12-14 DIAGNOSIS — F332 Major depressive disorder, recurrent severe without psychotic features: Secondary | ICD-10-CM | POA: Diagnosis not present

## 2019-12-14 DIAGNOSIS — F4310 Post-traumatic stress disorder, unspecified: Secondary | ICD-10-CM | POA: Diagnosis not present

## 2019-12-17 DIAGNOSIS — F332 Major depressive disorder, recurrent severe without psychotic features: Secondary | ICD-10-CM | POA: Diagnosis not present

## 2019-12-22 DIAGNOSIS — F332 Major depressive disorder, recurrent severe without psychotic features: Secondary | ICD-10-CM | POA: Diagnosis not present

## 2019-12-24 DIAGNOSIS — F4310 Post-traumatic stress disorder, unspecified: Secondary | ICD-10-CM | POA: Diagnosis not present

## 2020-01-04 DIAGNOSIS — F332 Major depressive disorder, recurrent severe without psychotic features: Secondary | ICD-10-CM | POA: Diagnosis not present

## 2020-01-07 DIAGNOSIS — F4310 Post-traumatic stress disorder, unspecified: Secondary | ICD-10-CM | POA: Diagnosis not present

## 2020-01-07 DIAGNOSIS — F332 Major depressive disorder, recurrent severe without psychotic features: Secondary | ICD-10-CM | POA: Diagnosis not present

## 2020-01-12 DIAGNOSIS — F332 Major depressive disorder, recurrent severe without psychotic features: Secondary | ICD-10-CM | POA: Diagnosis not present

## 2020-01-14 DIAGNOSIS — F332 Major depressive disorder, recurrent severe without psychotic features: Secondary | ICD-10-CM | POA: Diagnosis not present

## 2020-01-20 DIAGNOSIS — F332 Major depressive disorder, recurrent severe without psychotic features: Secondary | ICD-10-CM | POA: Diagnosis not present

## 2020-01-21 DIAGNOSIS — F4310 Post-traumatic stress disorder, unspecified: Secondary | ICD-10-CM | POA: Diagnosis not present

## 2020-01-25 DIAGNOSIS — F332 Major depressive disorder, recurrent severe without psychotic features: Secondary | ICD-10-CM | POA: Diagnosis not present

## 2020-01-26 DIAGNOSIS — R300 Dysuria: Secondary | ICD-10-CM | POA: Diagnosis not present

## 2020-01-28 DIAGNOSIS — F332 Major depressive disorder, recurrent severe without psychotic features: Secondary | ICD-10-CM | POA: Diagnosis not present

## 2020-02-01 DIAGNOSIS — F4310 Post-traumatic stress disorder, unspecified: Secondary | ICD-10-CM | POA: Diagnosis not present

## 2020-02-02 DIAGNOSIS — F332 Major depressive disorder, recurrent severe without psychotic features: Secondary | ICD-10-CM | POA: Diagnosis not present

## 2020-02-09 DIAGNOSIS — F4310 Post-traumatic stress disorder, unspecified: Secondary | ICD-10-CM | POA: Diagnosis not present

## 2020-02-09 DIAGNOSIS — F332 Major depressive disorder, recurrent severe without psychotic features: Secondary | ICD-10-CM | POA: Diagnosis not present

## 2020-02-11 DIAGNOSIS — F4310 Post-traumatic stress disorder, unspecified: Secondary | ICD-10-CM | POA: Diagnosis not present

## 2020-02-12 DIAGNOSIS — F3341 Major depressive disorder, recurrent, in partial remission: Secondary | ICD-10-CM | POA: Diagnosis not present

## 2020-02-12 DIAGNOSIS — F332 Major depressive disorder, recurrent severe without psychotic features: Secondary | ICD-10-CM | POA: Diagnosis not present

## 2020-02-16 DIAGNOSIS — Z131 Encounter for screening for diabetes mellitus: Secondary | ICD-10-CM | POA: Diagnosis not present

## 2020-02-16 DIAGNOSIS — F332 Major depressive disorder, recurrent severe without psychotic features: Secondary | ICD-10-CM | POA: Diagnosis not present

## 2020-02-16 DIAGNOSIS — E7800 Pure hypercholesterolemia, unspecified: Secondary | ICD-10-CM | POA: Diagnosis not present

## 2020-02-19 DIAGNOSIS — F332 Major depressive disorder, recurrent severe without psychotic features: Secondary | ICD-10-CM | POA: Diagnosis not present

## 2020-02-23 DIAGNOSIS — F332 Major depressive disorder, recurrent severe without psychotic features: Secondary | ICD-10-CM | POA: Diagnosis not present

## 2020-03-01 DIAGNOSIS — Z0000 Encounter for general adult medical examination without abnormal findings: Secondary | ICD-10-CM | POA: Diagnosis not present

## 2020-03-01 DIAGNOSIS — Z23 Encounter for immunization: Secondary | ICD-10-CM | POA: Diagnosis not present

## 2020-03-01 DIAGNOSIS — F332 Major depressive disorder, recurrent severe without psychotic features: Secondary | ICD-10-CM | POA: Diagnosis not present

## 2020-03-03 DIAGNOSIS — F4310 Post-traumatic stress disorder, unspecified: Secondary | ICD-10-CM | POA: Diagnosis not present

## 2020-03-04 DIAGNOSIS — F332 Major depressive disorder, recurrent severe without psychotic features: Secondary | ICD-10-CM | POA: Diagnosis not present

## 2020-03-08 DIAGNOSIS — F332 Major depressive disorder, recurrent severe without psychotic features: Secondary | ICD-10-CM | POA: Diagnosis not present

## 2020-03-18 DIAGNOSIS — F332 Major depressive disorder, recurrent severe without psychotic features: Secondary | ICD-10-CM | POA: Diagnosis not present

## 2020-03-21 DIAGNOSIS — F4310 Post-traumatic stress disorder, unspecified: Secondary | ICD-10-CM | POA: Diagnosis not present

## 2020-03-23 DIAGNOSIS — R319 Hematuria, unspecified: Secondary | ICD-10-CM | POA: Diagnosis not present

## 2020-03-23 DIAGNOSIS — N390 Urinary tract infection, site not specified: Secondary | ICD-10-CM | POA: Diagnosis not present

## 2020-04-04 DIAGNOSIS — F4310 Post-traumatic stress disorder, unspecified: Secondary | ICD-10-CM | POA: Diagnosis not present

## 2020-04-05 DIAGNOSIS — F332 Major depressive disorder, recurrent severe without psychotic features: Secondary | ICD-10-CM | POA: Diagnosis not present

## 2020-04-12 DIAGNOSIS — F332 Major depressive disorder, recurrent severe without psychotic features: Secondary | ICD-10-CM | POA: Diagnosis not present

## 2020-04-13 DIAGNOSIS — F4310 Post-traumatic stress disorder, unspecified: Secondary | ICD-10-CM | POA: Diagnosis not present

## 2020-04-18 DIAGNOSIS — F4310 Post-traumatic stress disorder, unspecified: Secondary | ICD-10-CM | POA: Diagnosis not present

## 2020-04-21 DIAGNOSIS — F4310 Post-traumatic stress disorder, unspecified: Secondary | ICD-10-CM | POA: Diagnosis not present

## 2020-04-21 DIAGNOSIS — F332 Major depressive disorder, recurrent severe without psychotic features: Secondary | ICD-10-CM | POA: Diagnosis not present

## 2020-04-26 DIAGNOSIS — F4310 Post-traumatic stress disorder, unspecified: Secondary | ICD-10-CM | POA: Diagnosis not present

## 2020-05-04 DIAGNOSIS — F4310 Post-traumatic stress disorder, unspecified: Secondary | ICD-10-CM | POA: Diagnosis not present

## 2020-05-04 DIAGNOSIS — F332 Major depressive disorder, recurrent severe without psychotic features: Secondary | ICD-10-CM | POA: Diagnosis not present

## 2020-05-09 DIAGNOSIS — R0981 Nasal congestion: Secondary | ICD-10-CM | POA: Diagnosis not present

## 2020-05-09 DIAGNOSIS — Z20822 Contact with and (suspected) exposure to covid-19: Secondary | ICD-10-CM | POA: Diagnosis not present

## 2020-05-09 DIAGNOSIS — R509 Fever, unspecified: Secondary | ICD-10-CM | POA: Diagnosis not present

## 2020-05-09 DIAGNOSIS — R059 Cough, unspecified: Secondary | ICD-10-CM | POA: Diagnosis not present

## 2020-05-18 DIAGNOSIS — F332 Major depressive disorder, recurrent severe without psychotic features: Secondary | ICD-10-CM | POA: Diagnosis not present

## 2020-05-23 DIAGNOSIS — F4310 Post-traumatic stress disorder, unspecified: Secondary | ICD-10-CM | POA: Diagnosis not present

## 2020-05-31 ENCOUNTER — Other Ambulatory Visit: Payer: Self-pay | Admitting: Family Medicine

## 2020-05-31 ENCOUNTER — Other Ambulatory Visit (HOSPITAL_COMMUNITY): Payer: Self-pay | Admitting: Family Medicine

## 2020-05-31 DIAGNOSIS — R1311 Dysphagia, oral phase: Secondary | ICD-10-CM | POA: Diagnosis not present

## 2020-05-31 DIAGNOSIS — Z6824 Body mass index (BMI) 24.0-24.9, adult: Secondary | ICD-10-CM | POA: Diagnosis not present

## 2020-06-01 DIAGNOSIS — F4310 Post-traumatic stress disorder, unspecified: Secondary | ICD-10-CM | POA: Diagnosis not present

## 2020-06-01 DIAGNOSIS — F332 Major depressive disorder, recurrent severe without psychotic features: Secondary | ICD-10-CM | POA: Diagnosis not present

## 2020-06-10 ENCOUNTER — Ambulatory Visit (HOSPITAL_COMMUNITY): Admission: RE | Admit: 2020-06-10 | Payer: BC Managed Care – PPO | Source: Ambulatory Visit

## 2020-06-10 ENCOUNTER — Other Ambulatory Visit: Payer: Self-pay

## 2020-06-10 ENCOUNTER — Encounter (HOSPITAL_COMMUNITY): Payer: Self-pay

## 2020-06-10 DIAGNOSIS — F332 Major depressive disorder, recurrent severe without psychotic features: Secondary | ICD-10-CM | POA: Diagnosis not present

## 2020-06-22 DIAGNOSIS — F4310 Post-traumatic stress disorder, unspecified: Secondary | ICD-10-CM | POA: Diagnosis not present

## 2020-06-23 DIAGNOSIS — F332 Major depressive disorder, recurrent severe without psychotic features: Secondary | ICD-10-CM | POA: Diagnosis not present

## 2020-06-23 DIAGNOSIS — F4310 Post-traumatic stress disorder, unspecified: Secondary | ICD-10-CM | POA: Diagnosis not present

## 2020-06-27 DIAGNOSIS — N952 Postmenopausal atrophic vaginitis: Secondary | ICD-10-CM | POA: Diagnosis not present

## 2020-06-27 DIAGNOSIS — N3021 Other chronic cystitis with hematuria: Secondary | ICD-10-CM | POA: Diagnosis not present

## 2020-06-28 DIAGNOSIS — F332 Major depressive disorder, recurrent severe without psychotic features: Secondary | ICD-10-CM | POA: Diagnosis not present

## 2020-06-29 DIAGNOSIS — F4310 Post-traumatic stress disorder, unspecified: Secondary | ICD-10-CM | POA: Diagnosis not present

## 2020-06-30 DIAGNOSIS — R1311 Dysphagia, oral phase: Secondary | ICD-10-CM | POA: Diagnosis not present

## 2020-06-30 DIAGNOSIS — K225 Diverticulum of esophagus, acquired: Secondary | ICD-10-CM | POA: Diagnosis not present

## 2020-07-07 DIAGNOSIS — F4310 Post-traumatic stress disorder, unspecified: Secondary | ICD-10-CM | POA: Diagnosis not present

## 2020-07-12 DIAGNOSIS — F332 Major depressive disorder, recurrent severe without psychotic features: Secondary | ICD-10-CM | POA: Diagnosis not present

## 2020-07-15 ENCOUNTER — Emergency Department (HOSPITAL_BASED_OUTPATIENT_CLINIC_OR_DEPARTMENT_OTHER)
Admission: EM | Admit: 2020-07-15 | Discharge: 2020-07-16 | Disposition: A | Discharge status: Home / Self Care | Payer: BC Managed Care – PPO | Attending: Emergency Medicine | Admitting: Emergency Medicine

## 2020-07-15 DIAGNOSIS — Z87891 Personal history of nicotine dependence: Secondary | ICD-10-CM | POA: Insufficient documentation

## 2020-07-15 DIAGNOSIS — W19XXXA Unspecified fall, initial encounter: Secondary | ICD-10-CM | POA: Diagnosis not present

## 2020-07-15 DIAGNOSIS — S79921A Unspecified injury of right thigh, initial encounter: Secondary | ICD-10-CM | POA: Diagnosis not present

## 2020-07-15 DIAGNOSIS — S7011XA Contusion of right thigh, initial encounter: Secondary | ICD-10-CM | POA: Diagnosis not present

## 2020-07-15 DIAGNOSIS — Z043 Encounter for examination and observation following other accident: Secondary | ICD-10-CM | POA: Diagnosis not present

## 2020-07-15 DIAGNOSIS — Z7951 Long term (current) use of inhaled steroids: Secondary | ICD-10-CM | POA: Insufficient documentation

## 2020-07-15 DIAGNOSIS — Y9301 Activity, walking, marching and hiking: Secondary | ICD-10-CM | POA: Insufficient documentation

## 2020-07-15 DIAGNOSIS — M79645 Pain in left finger(s): Secondary | ICD-10-CM | POA: Diagnosis not present

## 2020-07-15 DIAGNOSIS — Z7982 Long term (current) use of aspirin: Secondary | ICD-10-CM | POA: Insufficient documentation

## 2020-07-15 DIAGNOSIS — J45909 Unspecified asthma, uncomplicated: Secondary | ICD-10-CM | POA: Diagnosis not present

## 2020-07-15 NOTE — ED Triage Notes (Signed)
Pt fell about 7:45 pain to right hip and left hand

## 2020-07-16 ENCOUNTER — Encounter (HOSPITAL_BASED_OUTPATIENT_CLINIC_OR_DEPARTMENT_OTHER): Payer: Self-pay | Admitting: Emergency Medicine

## 2020-07-16 ENCOUNTER — Emergency Department (HOSPITAL_BASED_OUTPATIENT_CLINIC_OR_DEPARTMENT_OTHER): Payer: BC Managed Care – PPO

## 2020-07-16 ENCOUNTER — Other Ambulatory Visit: Payer: Self-pay

## 2020-07-16 DIAGNOSIS — Z043 Encounter for examination and observation following other accident: Secondary | ICD-10-CM | POA: Diagnosis not present

## 2020-07-16 DIAGNOSIS — M79645 Pain in left finger(s): Secondary | ICD-10-CM | POA: Diagnosis not present

## 2020-07-16 LAB — CBC
HCT: 35.8 % — ABNORMAL LOW (ref 36.0–46.0)
Hemoglobin: 11.9 g/dL — ABNORMAL LOW (ref 12.0–15.0)
MCH: 31.9 pg (ref 26.0–34.0)
MCHC: 33.2 g/dL (ref 30.0–36.0)
MCV: 96 fL (ref 80.0–100.0)
Platelets: 271 10*3/uL (ref 150–400)
RBC: 3.73 MIL/uL — ABNORMAL LOW (ref 3.87–5.11)
RDW: 14.6 % (ref 11.5–15.5)
WBC: 8.4 10*3/uL (ref 4.0–10.5)
nRBC: 0.2 % (ref 0.0–0.2)

## 2020-07-16 LAB — BASIC METABOLIC PANEL
Anion gap: 8 (ref 5–15)
BUN: 24 mg/dL — ABNORMAL HIGH (ref 8–23)
CO2: 22 mmol/L (ref 22–32)
Calcium: 9.1 mg/dL (ref 8.9–10.3)
Chloride: 106 mmol/L (ref 98–111)
Creatinine, Ser: 0.72 mg/dL (ref 0.44–1.00)
GFR, Estimated: >60 mL/min (ref >60)
Glucose, Bld: 89 mg/dL (ref 70–99)
Potassium: 4.1 mmol/L (ref 3.5–5.1)
Sodium: 136 mmol/L (ref 135–145)

## 2020-07-16 MED ORDER — IBUPROFEN 600 MG PO TABS: 600.0000 mg | ORAL_TABLET | Freq: Four times a day (QID) | ORAL | 0 refills | Status: DC | PRN

## 2020-07-16 MED ORDER — OXYCODONE-ACETAMINOPHEN 5-325 MG PO TABS: 1.0000 | ORAL_TABLET | ORAL | 0 refills | Status: DC | PRN

## 2020-07-16 MED ORDER — ONDANSETRON HCL 4 MG/2ML IJ SOLN
4.0000 mg | Freq: Once | INTRAMUSCULAR | Status: AC
  Administered 2020-07-16: 4 mg via INTRAVENOUS
  Filled 2020-07-16: qty 2

## 2020-07-16 MED ORDER — MORPHINE SULFATE (PF) 4 MG/ML IV SOLN
4.0000 mg | Freq: Once | INTRAVENOUS | Status: AC
  Administered 2020-07-16: 4 mg via INTRAVENOUS
  Filled 2020-07-16: qty 1

## 2020-07-16 MED ORDER — CYCLOBENZAPRINE HCL 10 MG PO TABS: 10.0000 mg | ORAL_TABLET | Freq: Three times a day (TID) | ORAL | 0 refills | Status: DC | PRN

## 2020-07-16 NOTE — ED Provider Notes (Signed)
[facility] EMERGENCY DEPARTMENT Provider Note   CSN: [id number] Arrival date & time: 07/15/20  2352     History Chief Complaint  Patient presents with  . Fall    Jenna Delgado is a 60 y.o. female.  Patient presents to the emergency department for evaluation of right hip and left thumb pain after a fall.  Patient was walking and reports that she got her foot caught on something and it caused her to fall forward.  Patient complaining mostly of severe pain in the right hip with any movement.  She also has some moderate pain of the left thumb.  Did not hit her head or lose consciousness.  No neck or back pain.        Past Medical History:  Diagnosis Date  . Asthma   . Prediabetes   . Seasonal allergic rhinitis   . Stroke (HCC)   . Vitamin D deficiency     Patient Active Problem List   Diagnosis Date Noted  . TIA (transient ischemic attack) 10/28/2015  . Cerebrovascular accident (CVA) (HCC)   . Abnormal LFTs   . Cerebral infarction due to unspecified mechanism   . Acute CVA (cerebrovascular accident) (HCC) 09/29/2015  . Abnormal liver function test 09/29/2015  . Asthma 09/29/2015  . Bipolar disorder (HCC) 09/29/2015  . Hyperlipidemia 09/26/2015  . Stroke (cerebrum) (HCC) 09/25/2015  . Hyperglycemia 09/25/2015  . Acute ischemic stroke [facility])     Past Surgical History:  Procedure Laterality Date  . ABDOMINAL HYSTERECTOMY    . BLADDER SUSPENSION    . EP IMPLANTABLE DEVICE N/A 10/03/2015   Procedure: Loop Recorder Insertion;  Surgeon: Marinus Maw, MD;  Location: [facility] INVASIVE CV LAB;  Service: Cardiovascular;  Laterality: N/A;  . TEE WITHOUT CARDIOVERSION N/A 10/03/2015   Procedure: TRANSESOPHAGEAL ECHOCARDIOGRAM (TEE);  Surgeon: Vesta Mixer, MD;  Location: [facility] ENDOSCOPY;  Service: Cardiovascular;  Laterality: N/A;     OB History   No obstetric history on file.     Family History  Adopted: Yes    Social History   Tobacco Use  . Smoking status:  Former Games developer  . Smokeless tobacco: Never Used  Vaping Use  . Vaping Use: Never used  Substance Use Topics  . Alcohol use: Yes    Comment: occasionally  . Drug use: Yes    Types: Marijuana    Comment: occasionally    Home Medications Prior to Admission medications   Medication Sig Start Date End Date Taking? Authorizing Provider  cyclobenzaprine (FLEXERIL) 10 MG tablet Take 1 tablet (10 mg total) by mouth 3 (three) times daily as needed for muscle spasms. 07/16/20  Yes Miray Mancino, Canary Brim, MD  ibuprofen (ADVIL) 600 MG tablet Take 1 tablet (600 mg total) by mouth every 6 (six) hours as needed. 07/16/20  Yes Shalom Mcguiness, Canary Brim, MD  oxyCODONE-acetaminophen (PERCOCET) 5-325 MG tablet Take 1 tablet by mouth every 4 (four) hours as needed. 07/16/20  Yes Apphia Cropley, Canary Brim, MD  Acetylcarnitine HCl (ACETYL L-CARNITINE PO) Take 2 capsules by mouth daily.     [provider]  Ascorbic Acid (VITAMIN C) 1000 MG tablet Take 1,000 mg by mouth daily.    [provider]  aspirin EC 81 MG tablet Take 81 mg by mouth daily.    [provider]  B Complex Vitamins (B COMPLEX PO) Take 1 tablet by mouth daily.    [provider]  Cholecalciferol (VITAMIN D-1000 MAX ST) 1000 units tablet Take 1,000 Units  by mouth daily.    [provider]  Coenzyme Q10 (CO Q-10 PO) Take 1 capsule by mouth daily.    [provider]  fluticasone (FLONASE) 50 MCG/ACT nasal spray Place 1-2 sprays into both nostrils daily as needed for allergies or rhinitis.     [provider]  medium chain triglycerides (MCT OIL) oil Take 15 mLs by mouth daily.    [provider]  Melatonin 5 MG TABS Take 5 mg by mouth at bedtime as needed (sleep).    [provider]  Multiple Vitamins-Minerals (ONE-A-DAY WOMENS 50+ ADVANTAGE) TABS Take 1 tablet by mouth daily.    [provider]  Omega-3 Fatty Acids (FISH OIL PO) Take 1 capsule by mouth daily.     [provider]  Pirbuterol Acetate (MAXAIR AUTOHALER IN) Inhale 1-2 puffs into the lungs every 6 (six) hours as needed (for shortness of breath).     [provider]  TURMERIC PO Take 1,000 mg by mouth daily. IN LIQUID FORM    [provider]    Allergies    Benadryl [diphenhydramine hcl], Sumatriptan, Atorvastatin, Iodine, Ivp dye [iodinated diagnostic agents], Metrizamide, Copper-containing compounds, Crestor [rosuvastatin calcium], Resveratrol, Statins, Zocor [simvastatin], Aminophylline, Demeclocycline, Lamictal [lamotrigine], Lobster [shellfish allergy], Metoclopramide, Tetracyclines & related, and Theophyllines  Review of Systems   Review of Systems  Musculoskeletal: Positive for arthralgias. Negative for back pain and neck pain.  All other systems reviewed and are negative.   Physical Exam Updated Vital Signs BP 101/68 (BP Location: Right Arm)   Pulse 60   Temp 99.1 F (37.3 C) (Oral)   Resp 16   Ht 5\' 4"  (1.626 m)   Wt 61.2 kg   SpO2 95%   BMI 23.17 kg/m   Physical Exam Vitals and nursing note reviewed.  Constitutional:      General: She is not in acute distress.    Appearance: Normal appearance. She is well-developed.  HENT:     Head: Normocephalic and atraumatic.     Right Ear: Hearing normal.     Left Ear: Hearing normal.     Nose: Nose normal.  Eyes:     Conjunctiva/sclera: Conjunctivae normal.     Pupils: Pupils are equal, round, and reactive to light.  Cardiovascular:     Rate and Rhythm: Regular rhythm.     Heart sounds: S1 normal and S2 normal. No murmur heard. No friction rub. No gallop.   Pulmonary:     Effort: Pulmonary effort is normal. No respiratory distress.     Breath sounds: Normal breath sounds.  Chest:     Chest wall: No tenderness.  Abdominal:     General: Bowel sounds are normal.     Palpations: Abdomen is soft.     Tenderness: There is no abdominal tenderness. There is no guarding or rebound. Negative signs  include Murphy's sign and McBurney's sign.     Hernia: No hernia is present.  Musculoskeletal:     Left hand: Tenderness (thumb) present. No swelling, deformity or lacerations. Normal range of motion.     Cervical back: Normal range of motion and neck supple.     Right hip: Tenderness present. No deformity. Decreased range of motion.       Legs:  Skin:    General: Skin is warm and dry.     Findings: No rash.  Neurological:     Mental Status: She is alert and oriented to person, place, and time.     GCS:  GCS eye subscore is 4. GCS verbal subscore is 5. GCS motor subscore is 6.     Cranial Nerves: No cranial nerve deficit.     Sensory: No sensory deficit.     Coordination: Coordination normal.  Psychiatric:        Speech: Speech normal.        Behavior: Behavior normal.        Thought Content: Thought content normal.     ED Results / Procedures / Treatments   Labs (all labs ordered are listed, but only abnormal results are displayed) Labs Reviewed  CBC - Abnormal; Notable for the following components:      Result Value   RBC 3.73 (*)    Hemoglobin 11.9 (*)    HCT 35.8 (*)    All other components within normal limits  BASIC METABOLIC PANEL - Abnormal; Notable for the following components:   BUN 24 (*)    All other components within normal limits    EKG None  Radiology DG Finger Thumb Left  Result Date: 07/16/2020 CLINICAL DATA:  MCP thumb pain after fall EXAM: LEFT THUMB 2+V COMPARISON:  None. FINDINGS: No acute bony abnormality. Specifically, no fracture, subluxation, or dislocation. Mild arthrosis at the interphalangeal and metacarpophalangeal joint as well as at the first carpometacarpal joint. No evidence of an underlying arthropathy. No significant swelling. IMPRESSION: No acute osseous abnormality. Electronically Signed   By: Kreg Shropshire M.D.   On: 07/16/2020 01:12   DG Hip Unilat W or Wo Pelvis 2-3 Views Right  Result Date: 07/16/2020 CLINICAL DATA:  Fall EXAM:  DG HIP (WITH OR WITHOUT PELVIS) 2-3V RIGHT COMPARISON:  None. FINDINGS: Bones of the pelvis are intact and congruent. Proximal femora are intact and normally located. Extensive calcifications in the deep pelvis likely reflecting combination phlebolith and vascular calcium as well as postsurgical changes from prior bladder suspension. Moderate colonic stool burden with air-filled descending colon. Bowel gas pattern is otherwise nonobstructive. Remaining soft tissues are free of acute abnormality. IMPRESSION: 1. No acute osseous abnormality. 2. Moderate colonic stool burden. 3. Extensive calcifications in the deep pelvis likely reflecting combination of phlebolith and vascular calcium. Electronically Signed   By: Kreg Shropshire M.D.   On: 07/16/2020 01:13    Procedures Procedures   Medications Ordered in ED Medications  morphine 4 MG/ML injection 4 mg (4 mg Intravenous Given 07/16/20 0024)  ondansetron (ZOFRAN) injection 4 mg (4 mg Intravenous Given 07/16/20 0024)    ED Course  I have reviewed the triage vital signs and the nursing notes.  Pertinent labs & imaging results that were available during my care of the patient were reviewed by me and considered in my medical decision making (see chart for details).    MDM Rules/Calculators/A&P                          With right hip and left thumb pain after a fall.  Patient's main complaint is the hip pain.  She is experiencing pain in the lateral aspect of the right hip pain in the right quadricep region.  She has severe pain with active flexion of the hip but minimal pain with passive range of motion.  This is improved after morphine.  X-ray is negative for fracture. Final Clinical Impression(s) / ED Diagnoses Final diagnoses:  Contusion of right thigh, initial encounter    Rx / DC Orders ED Discharge Orders  Ordered    oxyCODONE-acetaminophen (PERCOCET) 5-325 MG tablet  Every 4 hours PRN        07/16/20 0145    ibuprofen (ADVIL) 600 MG  tablet  Every 6 hours PRN        07/16/20 0145    cyclobenzaprine (FLEXERIL) 10 MG tablet  3 times daily PRN        07/16/20 0145           Gilda CreasePollina, Einar Nolasco J, MD 07/16/20 0145

## 2020-07-16 NOTE — ED Notes (Signed)
Pt verbalizes understanding of discharge instructions. Opportunity for questioning and answers were provided. Jenna Delgado removed by staff, pt discharged from ED ambulatory to Home with Significant Other.   

## 2020-07-16 NOTE — ED Notes (Signed)
Patient returned from XRAY at this time.

## 2020-07-16 NOTE — ED Notes (Signed)
Patient transported to XRAY at this Time. 

## 2020-07-16 NOTE — ED Notes (Signed)
Patient able to ambulate at this time to the Bathroom. Patient required substantial assistance at this time and ambulation was painful but patient was able to ambulate without falling.

## 2020-07-18 DIAGNOSIS — F4310 Post-traumatic stress disorder, unspecified: Secondary | ICD-10-CM | POA: Diagnosis not present

## 2020-07-22 DIAGNOSIS — F332 Major depressive disorder, recurrent severe without psychotic features: Secondary | ICD-10-CM | POA: Diagnosis not present

## 2020-07-26 DIAGNOSIS — S76191D Other specified injury of right quadriceps muscle, fascia and tendon, subsequent encounter: Secondary | ICD-10-CM | POA: Diagnosis not present

## 2020-07-26 DIAGNOSIS — J4520 Mild intermittent asthma, uncomplicated: Secondary | ICD-10-CM | POA: Diagnosis not present

## 2020-07-27 DIAGNOSIS — F4310 Post-traumatic stress disorder, unspecified: Secondary | ICD-10-CM | POA: Diagnosis not present

## 2020-08-05 DIAGNOSIS — F332 Major depressive disorder, recurrent severe without psychotic features: Secondary | ICD-10-CM | POA: Diagnosis not present

## 2020-08-10 DIAGNOSIS — F32A Depression, unspecified: Secondary | ICD-10-CM | POA: Diagnosis not present

## 2020-08-10 DIAGNOSIS — N390 Urinary tract infection, site not specified: Secondary | ICD-10-CM | POA: Diagnosis not present

## 2020-08-10 DIAGNOSIS — G2581 Restless legs syndrome: Secondary | ICD-10-CM | POA: Diagnosis not present

## 2020-08-10 DIAGNOSIS — Z0001 Encounter for general adult medical examination with abnormal findings: Secondary | ICD-10-CM | POA: Diagnosis not present

## 2020-08-10 DIAGNOSIS — F4310 Post-traumatic stress disorder, unspecified: Secondary | ICD-10-CM | POA: Diagnosis not present

## 2020-08-10 DIAGNOSIS — K225 Diverticulum of esophagus, acquired: Secondary | ICD-10-CM | POA: Diagnosis not present

## 2020-08-10 DIAGNOSIS — E559 Vitamin D deficiency, unspecified: Secondary | ICD-10-CM | POA: Diagnosis not present

## 2020-08-17 DIAGNOSIS — R110 Nausea: Secondary | ICD-10-CM | POA: Diagnosis not present

## 2020-08-17 DIAGNOSIS — N390 Urinary tract infection, site not specified: Secondary | ICD-10-CM | POA: Diagnosis not present

## 2020-08-17 DIAGNOSIS — K225 Diverticulum of esophagus, acquired: Secondary | ICD-10-CM | POA: Diagnosis not present

## 2020-08-19 DIAGNOSIS — F332 Major depressive disorder, recurrent severe without psychotic features: Secondary | ICD-10-CM | POA: Diagnosis not present

## 2020-08-22 DIAGNOSIS — F4310 Post-traumatic stress disorder, unspecified: Secondary | ICD-10-CM | POA: Diagnosis not present

## 2020-08-24 DIAGNOSIS — N390 Urinary tract infection, site not specified: Secondary | ICD-10-CM | POA: Diagnosis not present

## 2020-08-24 DIAGNOSIS — N12 Tubulo-interstitial nephritis, not specified as acute or chronic: Secondary | ICD-10-CM | POA: Diagnosis not present

## 2020-09-01 DIAGNOSIS — F332 Major depressive disorder, recurrent severe without psychotic features: Secondary | ICD-10-CM | POA: Diagnosis not present

## 2020-09-01 DIAGNOSIS — F3342 Major depressive disorder, recurrent, in full remission: Secondary | ICD-10-CM | POA: Diagnosis not present

## 2020-09-05 DIAGNOSIS — K225 Diverticulum of esophagus, acquired: Secondary | ICD-10-CM | POA: Diagnosis not present

## 2020-09-05 DIAGNOSIS — R1319 Other dysphagia: Secondary | ICD-10-CM | POA: Diagnosis not present

## 2020-09-06 DIAGNOSIS — F4310 Post-traumatic stress disorder, unspecified: Secondary | ICD-10-CM | POA: Diagnosis not present

## 2020-09-08 DIAGNOSIS — K225 Diverticulum of esophagus, acquired: Secondary | ICD-10-CM | POA: Diagnosis not present

## 2020-09-08 DIAGNOSIS — N390 Urinary tract infection, site not specified: Secondary | ICD-10-CM | POA: Diagnosis not present

## 2020-09-08 DIAGNOSIS — R300 Dysuria: Secondary | ICD-10-CM | POA: Diagnosis not present

## 2020-09-08 DIAGNOSIS — F32A Depression, unspecified: Secondary | ICD-10-CM | POA: Diagnosis not present

## 2020-09-12 DIAGNOSIS — F332 Major depressive disorder, recurrent severe without psychotic features: Secondary | ICD-10-CM | POA: Diagnosis not present

## 2020-09-15 DIAGNOSIS — N12 Tubulo-interstitial nephritis, not specified as acute or chronic: Secondary | ICD-10-CM | POA: Diagnosis not present

## 2020-09-15 DIAGNOSIS — N3020 Other chronic cystitis without hematuria: Secondary | ICD-10-CM | POA: Diagnosis not present

## 2020-09-15 DIAGNOSIS — J4520 Mild intermittent asthma, uncomplicated: Secondary | ICD-10-CM | POA: Diagnosis not present

## 2020-09-15 DIAGNOSIS — Z0001 Encounter for general adult medical examination with abnormal findings: Secondary | ICD-10-CM | POA: Diagnosis not present

## 2020-09-15 DIAGNOSIS — K225 Diverticulum of esophagus, acquired: Secondary | ICD-10-CM | POA: Diagnosis not present

## 2020-09-15 DIAGNOSIS — Z23 Encounter for immunization: Secondary | ICD-10-CM | POA: Diagnosis not present

## 2020-09-20 DIAGNOSIS — F4310 Post-traumatic stress disorder, unspecified: Secondary | ICD-10-CM | POA: Diagnosis not present

## 2020-09-23 DIAGNOSIS — N3020 Other chronic cystitis without hematuria: Secondary | ICD-10-CM | POA: Diagnosis not present

## 2020-09-23 DIAGNOSIS — N200 Calculus of kidney: Secondary | ICD-10-CM | POA: Diagnosis not present

## 2020-09-26 DIAGNOSIS — N3020 Other chronic cystitis without hematuria: Secondary | ICD-10-CM | POA: Diagnosis not present

## 2020-09-26 DIAGNOSIS — F332 Major depressive disorder, recurrent severe without psychotic features: Secondary | ICD-10-CM | POA: Diagnosis not present

## 2020-09-26 DIAGNOSIS — N952 Postmenopausal atrophic vaginitis: Secondary | ICD-10-CM | POA: Diagnosis not present

## 2020-10-18 DIAGNOSIS — F332 Major depressive disorder, recurrent severe without psychotic features: Secondary | ICD-10-CM | POA: Diagnosis not present

## 2020-10-20 DIAGNOSIS — F4310 Post-traumatic stress disorder, unspecified: Secondary | ICD-10-CM | POA: Diagnosis not present

## 2020-10-24 DIAGNOSIS — I2510 Atherosclerotic heart disease of native coronary artery without angina pectoris: Secondary | ICD-10-CM | POA: Diagnosis not present

## 2020-10-24 DIAGNOSIS — J984 Other disorders of lung: Secondary | ICD-10-CM | POA: Diagnosis not present

## 2020-10-24 DIAGNOSIS — Z8673 Personal history of transient ischemic attack (TIA), and cerebral infarction without residual deficits: Secondary | ICD-10-CM | POA: Diagnosis not present

## 2020-10-24 DIAGNOSIS — R911 Solitary pulmonary nodule: Secondary | ICD-10-CM | POA: Diagnosis not present

## 2020-10-24 DIAGNOSIS — K225 Diverticulum of esophagus, acquired: Secondary | ICD-10-CM | POA: Diagnosis not present

## 2020-10-25 ENCOUNTER — Telehealth: Payer: Self-pay

## 2020-10-25 NOTE — Telephone Encounter (Signed)
Referral notes sent from [facility], Phone #: [phone], Fax #: [phone]   Notes sent to scheduling  

## 2020-11-03 DIAGNOSIS — F4310 Post-traumatic stress disorder, unspecified: Secondary | ICD-10-CM | POA: Diagnosis not present

## 2020-11-03 DIAGNOSIS — F332 Major depressive disorder, recurrent severe without psychotic features: Secondary | ICD-10-CM | POA: Diagnosis not present

## 2020-11-16 DIAGNOSIS — F4310 Post-traumatic stress disorder, unspecified: Secondary | ICD-10-CM | POA: Diagnosis not present

## 2020-11-16 DIAGNOSIS — F332 Major depressive disorder, recurrent severe without psychotic features: Secondary | ICD-10-CM | POA: Diagnosis not present

## 2020-11-24 DIAGNOSIS — F332 Major depressive disorder, recurrent severe without psychotic features: Secondary | ICD-10-CM | POA: Diagnosis not present

## 2020-11-29 DIAGNOSIS — F332 Major depressive disorder, recurrent severe without psychotic features: Secondary | ICD-10-CM | POA: Diagnosis not present

## 2020-11-30 DIAGNOSIS — F3342 Major depressive disorder, recurrent, in full remission: Secondary | ICD-10-CM | POA: Diagnosis not present

## 2020-11-30 DIAGNOSIS — F4310 Post-traumatic stress disorder, unspecified: Secondary | ICD-10-CM | POA: Diagnosis not present

## 2020-12-01 DIAGNOSIS — R200 Anesthesia of skin: Secondary | ICD-10-CM | POA: Diagnosis not present

## 2020-12-01 DIAGNOSIS — Z87891 Personal history of nicotine dependence: Secondary | ICD-10-CM | POA: Diagnosis not present

## 2020-12-01 DIAGNOSIS — Z8673 Personal history of transient ischemic attack (TIA), and cerebral infarction without residual deficits: Secondary | ICD-10-CM | POA: Diagnosis not present

## 2020-12-01 DIAGNOSIS — K2289 Other specified disease of esophagus: Secondary | ICD-10-CM | POA: Diagnosis not present

## 2020-12-01 DIAGNOSIS — R202 Paresthesia of skin: Secondary | ICD-10-CM | POA: Diagnosis not present

## 2020-12-01 DIAGNOSIS — K225 Diverticulum of esophagus, acquired: Secondary | ICD-10-CM | POA: Diagnosis not present

## 2020-12-01 DIAGNOSIS — Z888 Allergy status to other drugs, medicaments and biological substances status: Secondary | ICD-10-CM | POA: Diagnosis not present

## 2020-12-01 DIAGNOSIS — R208 Other disturbances of skin sensation: Secondary | ICD-10-CM | POA: Diagnosis not present

## 2020-12-01 DIAGNOSIS — Z881 Allergy status to other antibiotic agents status: Secondary | ICD-10-CM | POA: Diagnosis not present

## 2020-12-01 DIAGNOSIS — R519 Headache, unspecified: Secondary | ICD-10-CM | POA: Diagnosis not present

## 2020-12-13 DIAGNOSIS — F332 Major depressive disorder, recurrent severe without psychotic features: Secondary | ICD-10-CM | POA: Diagnosis not present

## 2020-12-14 DIAGNOSIS — F4310 Post-traumatic stress disorder, unspecified: Secondary | ICD-10-CM | POA: Diagnosis not present

## 2020-12-23 DIAGNOSIS — F332 Major depressive disorder, recurrent severe without psychotic features: Secondary | ICD-10-CM | POA: Diagnosis not present

## 2020-12-28 DIAGNOSIS — F4310 Post-traumatic stress disorder, unspecified: Secondary | ICD-10-CM | POA: Diagnosis not present

## 2020-12-29 DIAGNOSIS — N952 Postmenopausal atrophic vaginitis: Secondary | ICD-10-CM | POA: Diagnosis not present

## 2020-12-29 DIAGNOSIS — N3020 Other chronic cystitis without hematuria: Secondary | ICD-10-CM | POA: Diagnosis not present

## 2021-01-04 ENCOUNTER — Ambulatory Visit: Payer: BC Managed Care – PPO | Admitting: Interventional Cardiology

## 2021-01-05 ENCOUNTER — Telehealth: Payer: Self-pay

## 2021-01-05 NOTE — Telephone Encounter (Signed)
NOTES SCANNED TO REFERRAL 

## 2021-01-13 DIAGNOSIS — F332 Major depressive disorder, recurrent severe without psychotic features: Secondary | ICD-10-CM | POA: Diagnosis not present

## 2021-01-17 DIAGNOSIS — F4310 Post-traumatic stress disorder, unspecified: Secondary | ICD-10-CM | POA: Diagnosis not present

## 2021-01-20 DIAGNOSIS — F332 Major depressive disorder, recurrent severe without psychotic features: Secondary | ICD-10-CM | POA: Diagnosis not present

## 2021-01-23 DIAGNOSIS — F4310 Post-traumatic stress disorder, unspecified: Secondary | ICD-10-CM | POA: Diagnosis not present

## 2021-01-30 DIAGNOSIS — F4310 Post-traumatic stress disorder, unspecified: Secondary | ICD-10-CM | POA: Diagnosis not present

## 2021-01-31 DIAGNOSIS — F332 Major depressive disorder, recurrent severe without psychotic features: Secondary | ICD-10-CM | POA: Diagnosis not present

## 2021-02-20 DIAGNOSIS — F4310 Post-traumatic stress disorder, unspecified: Secondary | ICD-10-CM | POA: Diagnosis not present

## 2021-02-27 DIAGNOSIS — R1310 Dysphagia, unspecified: Secondary | ICD-10-CM | POA: Diagnosis not present

## 2021-02-28 DIAGNOSIS — F332 Major depressive disorder, recurrent severe without psychotic features: Secondary | ICD-10-CM | POA: Diagnosis not present

## 2021-03-02 ENCOUNTER — Encounter: Payer: Self-pay | Admitting: Cardiology

## 2021-03-02 ENCOUNTER — Other Ambulatory Visit: Payer: Self-pay

## 2021-03-02 ENCOUNTER — Ambulatory Visit: Payer: BC Managed Care – PPO | Admitting: Cardiology

## 2021-03-02 VITALS — BP 118/72 | HR 59 | Resp 20 | Ht 64.0 in | Wt 148.2 lb

## 2021-03-02 DIAGNOSIS — R739 Hyperglycemia, unspecified: Secondary | ICD-10-CM

## 2021-03-02 DIAGNOSIS — I639 Cerebral infarction, unspecified: Secondary | ICD-10-CM

## 2021-03-02 DIAGNOSIS — F4310 Post-traumatic stress disorder, unspecified: Secondary | ICD-10-CM | POA: Diagnosis not present

## 2021-03-02 DIAGNOSIS — E782 Mixed hyperlipidemia: Secondary | ICD-10-CM | POA: Diagnosis not present

## 2021-03-02 DIAGNOSIS — I2510 Atherosclerotic heart disease of native coronary artery without angina pectoris: Secondary | ICD-10-CM | POA: Insufficient documentation

## 2021-03-02 NOTE — Patient Instructions (Signed)
Medication Instructions:  No changes  *If you need a refill on your cardiac medications before your next appointment, please call your pharmacy*   Lab Work:  Not needed   Testing/Procedures:  CT coronary calcium score. This test is done at [address]. This is $99 out of pocket.   Coronary CalciumScan A coronary calcium scan is an imaging test used to look for deposits of calcium and other fatty materials (plaques) in the inner lining of the blood vessels of the heart (coronary arteries). These deposits of calcium and plaques can partly clog and narrow the coronary arteries without producing any symptoms or warning signs. This puts a person at risk for a heart attack. This test can detect these deposits before symptoms develop. Tell a health care provider about: Any allergies you have. All medicines you are taking, including vitamins, herbs, eye drops, creams, and over-the-counter medicines. Any problems you or family members have had with anesthetic medicines. Any blood disorders you have. Any surgeries you have had. Any medical conditions you have. Whether you are pregnant or may be pregnant. What are the risks? Generally, this is a safe procedure. However, problems may occur, including: Harm to a pregnant woman and her unborn baby. This test involves the use of radiation. Radiation exposure can be dangerous to a pregnant woman and her unborn baby. If you are pregnant, you generally should not have this procedure done. Slight increase in the risk of cancer. This is because of the radiation involved in the test. What happens before the procedure? No preparation is needed for this procedure. What happens during the procedure? You will undress and remove any jewelry around your neck or chest. You will put on a hospital gown. Sticky electrodes will be placed on your chest. The electrodes will be connected to an electrocardiogram (ECG) machine to record a tracing of  the electrical activity of your heart. A CT scanner will take pictures of your heart. During this time, you will be asked to lie still and hold your breath for 2-3 seconds while a picture of your heart is being taken. The procedure may vary among health care providers and hospitals. What happens after the procedure? You can get dressed. You can return to your normal activities. It is up to you to get the results of your test. Ask your health care provider, or the department that is doing the test, when your results will be ready. Summary A coronary calcium scan is an imaging test used to look for deposits of calcium and other fatty materials (plaques) in the inner lining of the blood vessels of the heart (coronary arteries). Generally, this is a safe procedure. Tell your health care provider if you are pregnant or may be pregnant. No preparation is needed for this procedure. A CT scanner will take pictures of your heart. You can return to your normal activities after the scan is done. This information is not intended to replace advice given to you by your health care provider. Make sure you discuss any questions you have with your health care provider. Document Released: 09/15/2007 Document Revised: 02/06/2016 Document Reviewed: 02/06/2016 Elsevier Interactive Patient Education  2017 [place]: At [facility], you and your health needs are our priority.  As part of our continuing mission to provide you with exceptional heart care, we have created designated Provider Care Teams.  These Care Teams include your primary Cardiologist (physician) and Advanced Practice Providers (APPs -  Physician Assistants and Nurse Practitioners) who all work together to provide you with the care you need, when you need it.     Your next appointment:   2 month(s)  The format for your next appointment:   In Person  Provider:   None    Other Instructions

## 2021-03-02 NOTE — Progress Notes (Signed)
Primary Care Provider: Dois Davenport, MD [facility] Cardiologist: None Electrophysiologist: None  Clinic Note: Chief Complaint  Patient presents with   New Patient (Initial Visit)    Coronary calcification on CT   ===================================  ASSESSMENT/PLAN   Problem List Items Addressed This Visit       Cardiology Problems   Hyperlipidemia, mixed (Chronic)    High total cholesterol and LDL but also relatively high HDL.  Could consider extended lipid panel but for now I think we would simply start with baseline cardiovascular risk assessment.  Need to determine if there is indication beyond her stroke to treat.  Plan: Coronary Calcium Score Will need to get recent lipids from PCP)  Pending results coronary calcium score if indicated, will consider Referral to CVRR ([facility]) Lipid Clinic      Coronary artery calcification seen on CAT scan (Chronic)    Coronary calcification noted on CT scan.  Need to quantify.  She is totally asymptomatic.  No known family history because she is adopted.  Would already want to see her LDL hopefully less than 100 and a minimum because of her stroke, preferably less than 70.    Plan: Further risk stratification with Coronary Calcium Score.  Continue aspirin.       Relevant Orders   EKG 12-Lead (Completed)   CT CARDIAC SCORING (SELF PAY ONLY)   Cerebrovascular accident (CVA) (HCC) - Primary   Relevant Orders   EKG 12-Lead (Completed)   CT CARDIAC SCORING (SELF PAY ONLY)     Other   Hyperglycemia    Last A1c was 5.5.      Cerebral infarction (HCC) (Chronic)    ILR in place.  No signs or symptoms of A. fib.  Is on aspirin, but has poorly controlled lipids-not on statin because of memory issues.      Relevant Orders   EKG 12-Lead (Completed)   CT CARDIAC SCORING (SELF PAY ONLY)    ===================================  HPI:    Jenna Delgado is a 60 y.o. female  with PMH notable for CVA (ILR in place-no A. fib), HLD, and Mild Intermittent Asthma is being seen today for the evaluation of Baseline Cardiovascular Risk Assessment, Coronary Calcification on CT at the request of Dois Davenport, MD.  Jenna Delgado was last seen on May 27, 2018 by Dr. Lewayne Bunting.  She had had an ILR placed for cryptogenic stroke and there was discussion about whether it should be removed.  She had lost 50 pounds.  ILR removed.  No signs of A. fib on ILR at end of service.  Recent Hospitalizations: None  Reviewed  CV studies:    The following studies were reviewed today: (if available, images/films reviewed: From Epic Chart or Care Everywhere) TTE 09/26/2015: EF 55 to 60%.  No R WMA.  GR 1 DD.  Mild LA dilation.  No cardiac source of emboli noted TEE 10/03/2015: EF 60 to 65%.  No vegetation.  No LAA thrombus.  No PFO.   Interval History:   Jenna Delgado presents here today for cardiology evaluation, not really sure why she is here.  She used her telemetry something on CT scan meds but it.  It appears that she had a CT scan done by [facility] which suggested Coronary Artery Calcification.  She herself is completely asymptomatic from a cardiac standpoint.  She is relatively active but is out of shape so she may get little short of breath  if she walks rapidly up a hill or carrying something, but is able to do most of what she would like to do.  She has not been exercising regularly for the last several months because she and her husband are refitting her home, and they have that time.  She denies any chest pain pressure or dyspnea with rest or exertion.  No PND, orthopnea or edema.  She has episodic asthma maybe once or twice a year usually associated with change in seasons or allergies.  Her main residual effect of the TIA/CVA that she had several years ago was that she has some issues with word finding, confusion and inability to multitask.  No recurrent symptoms  and no exacerbation of symptoms.  She denies any rapid irregular heartbeats palpitations.  No syncope or near syncope. She indicates that she has had issues with just about every statin that she is tried including atorvastatin, rosuvastatin, simvastatin that are listed but also she thinks she has tried pravastatin.  She just has more mild memory issues that actually seem to exacerbate her existing memory issues.  She also has mild myalgias but mostly memory issues.   REVIEWED OF SYSTEMS   Pertinent positive and negative symptoms noted above. Review of Systems  Constitutional:  Negative for weight loss (Actually she has gained some weight since she has not been exercising as routinely.).  HENT:  Negative for nosebleeds.   Respiratory:  Negative for shortness of breath and wheezing.        Only has wheezing and dyspnea when she has her asthma spells.  Otherwise only has exertional dyspnea when she overdoes it.  Cardiovascular:        Per HPI  Gastrointestinal:  Negative for blood in stool and melena.  Genitourinary:  Negative for hematuria.  Musculoskeletal:  Positive for joint pain (Just mild aches and pains).  Neurological:  Negative for dizziness and focal weakness.       See above  post TIA symptoms.  Psychiatric/Behavioral:  Positive for memory loss. Negative for depression. The patient is not nervous/anxious and does not have insomnia.    I have reviewed and (if needed) personally updated the patient's problem list, medications, allergies, past medical and surgical history, social and family history.   PAST MEDICAL HISTORY   Past Medical History:  Diagnosis Date   Asthma    Prediabetes    Seasonal allergic rhinitis    Stroke ([place])    Vitamin D deficiency     PAST SURGICAL HISTORY   Past Surgical History:  Procedure Laterality Date   ABDOMINAL HYSTERECTOMY     BLADDER SUSPENSION     EP IMPLANTABLE DEVICE N/A 10/03/2015   Procedure: Loop Recorder Insertion;  Surgeon: Evans Lance, MD;  Location: [facility];  Service: Cardiovascular;  Laterality: N/A;   TEE WITHOUT CARDIOVERSION N/A 10/03/2015   Procedure: TRANSESOPHAGEAL ECHOCARDIOGRAM (TEE);  Surgeon: Thayer Headings, MD;  Location: [facility] ENDOSCOPY;  Service: Cardiovascular;  Laterality: N/A;     There is no immunization history on file for this patient.  MEDICATIONS/ALLERGIES   Current Meds  Medication Sig   Acetylcarnitine HCl (ACETYL L-CARNITINE PO) Take 2 capsules by mouth daily.    aspirin EC 81 MG tablet Take 81 mg by mouth daily.   B Complex Vitamins (B COMPLEX PO) Take 1 tablet by mouth daily.   Cholecalciferol 25 MCG (1000 UT) tablet Take 1,000 Units by mouth daily.   Coenzyme Q10 (CO Q-10 PO) Take 1 capsule  by mouth daily.   fluticasone (FLONASE) 50 MCG/ACT nasal spray Place 1-2 sprays into both nostrils daily as needed for allergies or rhinitis.    MAGNESIUM PO Take 400 mg by mouth at bedtime.   medium chain triglycerides (MCT OIL) oil Take 15 mLs by mouth daily.   Melatonin 5 MG TABS Take 5 mg by mouth at bedtime as needed (sleep).   MULTIPLE VITAMIN-FOLIC ACID PO 1 tablet   Multiple Vitamins-Minerals (ONE-A-DAY WOMENS 50+ ADVANTAGE) TABS Take 1 tablet by mouth daily.   Omega-3 Fatty Acids (FISH OIL PO) Take 1 capsule by mouth daily.   Pirbuterol Acetate (MAXAIR AUTOHALER IN) Inhale 1-2 puffs into the lungs every 6 (six) hours as needed (for shortness of breath).    TURMERIC PO Take 1,000 mg by mouth daily. IN LIQUID FORM    Allergies  Allergen Reactions   Benadryl [Diphenhydramine Hcl] Palpitations and Other (See Comments)    Heart pounding, panic attacks   Sumatriptan Palpitations and Other (See Comments)    Heart pounding and panic attacks   Atorvastatin Other (See Comments)    Muscle aches and memory issues   Iodine Hives    IVP test problems; was told to "never use this again" (as a teenager)   Ivp Dye [Iodinated Diagnostic Agents] Hives and Other (See Comments)    IVP test  problems   Metrizamide Hives    IVP test problems   Copper-Containing Compounds Other (See Comments)    Makes joints hurt   Crestor [Rosuvastatin Calcium] Other (See Comments)    Muscle aches and memory issues   Resveratrol Other (See Comments)    Muscle aches and memory & mental status changes   Statins Other (See Comments)    Muscle aches and memory changes   Zocor [Simvastatin] Other (See Comments)    Muscle aches and memory issues   Aminophylline Rash    Target rash   Demeclocycline Rash and Other (See Comments)    Blisters down the throat   Lamictal [Lamotrigine] Rash   Lobster [Shellfish Allergy] Hives and Itching   Metoclopramide Anxiety and Palpitations    Causes BAD anxiety   Tetracyclines & Related Swelling and Rash    Blisters down the throat and the tongue swells   Theophyllines Rash    Target rash    SOCIAL HISTORY/FAMILY HISTORY   Reviewed in Epic:   Social History   Tobacco Use   Smoking status: Former   Smokeless tobacco: Never  Scientific laboratory technician Use: Never used  Substance Use Topics   Alcohol use: Yes    Comment: occasionally   Drug use: Yes    Types: Marijuana    Comment: occasionally   Social History   Social History Narrative   Drinks very little caffeine    Family History  Adopted: Yes    OBJCTIVE -PE, EKG, labs   Wt Readings from Last 3 Encounters:  03/02/21 148 lb 3.2 oz (67.2 kg)  07/16/20 135 lb (61.2 kg)  05/27/18 140 lb 3.2 oz (63.6 kg)    Physical Exam: BP 118/72 (BP Location: Left Arm, Patient Position: Sitting, Cuff Size: Normal)   Pulse (!) 59   Resp 20   Ht 5\' 4"  (1.626 m)   Wt 148 lb 3.2 oz (67.2 kg)   BMI 25.44 kg/m  Physical Exam Vitals reviewed.  Constitutional:      General: She is not in acute distress.    Appearance: Normal appearance. She is normal weight. She is  not ill-appearing or toxic-appearing.  HENT:     Head: Normocephalic and atraumatic.  Eyes:     Extraocular Movements: Extraocular  movements intact.     Pupils: Pupils are equal, round, and reactive to light.     Comments: Wears glasses  Neck:     Vascular: No carotid bruit, hepatojugular reflux or JVD.  Cardiovascular:     Rate and Rhythm: Normal rate and regular rhythm.     Pulses: Normal pulses.     Heart sounds: Normal heart sounds, S1 normal and S2 normal. No murmur heard.   No friction rub. No gallop.  Pulmonary:     Effort: Pulmonary effort is normal. No respiratory distress.     Breath sounds: Normal breath sounds. No wheezing, rhonchi or rales.  Chest:     Chest wall: No tenderness.  Abdominal:     General: Abdomen is flat. Bowel sounds are normal. There is no distension.     Palpations: Abdomen is soft. There is no mass.     Tenderness: There is no abdominal tenderness.     Comments: No additional bleeding.  Musculoskeletal:        General: No swelling. Normal range of motion.     Cervical back: Normal range of motion and neck supple.  Skin:    General: Skin is warm and dry.  Neurological:     General: No focal deficit present.     Mental Status: She is alert and oriented to person, place, and time.  Psychiatric:        Mood and Affect: Mood normal.        Behavior: Behavior normal.        Thought Content: Thought content normal.        Judgment: Judgment normal.     Adult ECG Report  Rate: 59 ;  Rhythm: normal sinus rhythm and normal axis, intervals and durations. ;   Narrative Interpretation: Normal  Recent Labs:   02/16/2020: TC 239, TG 54, HDL 86, LDL 144, A1c 5.5. 07/16/2020: BUN/Cr 24/0.72 Lab Results  Component Value Date   CHOL 150 09/30/2015   HDL 51 09/30/2015   LDLCALC 77 09/30/2015   TRIG 111 09/30/2015   CHOLHDL 2.9 09/30/2015   Lab Results  Component Value Date   CREATININE 0.72 07/16/2020   BUN 24 (H) 07/16/2020   NA 136 07/16/2020   K 4.1 07/16/2020   CL 106 07/16/2020   CO2 22 07/16/2020   CBC Latest Ref Rng & Units 07/16/2020 01/27/2018 01/27/2018  WBC 4.0 -  10.5 K/uL 8.4 - 7.6  Hemoglobin 12.0 - 15.0 g/dL 11.9(L) 14.6 14.3  Hematocrit 36.0 - 46.0 % 35.8(L) 43.0 43.2  Platelets 150 - 400 K/uL 271 - 216    Lab Results  Component Value Date   HGBA1C 6.0 (H) 09/30/2015   Lab Results  Component Value Date   TSH 2.350 03/21/2016    ==================================================  COVID-19 Education: The signs and symptoms of COVID-19 were discussed with the patient and how to seek care for testing (follow up with PCP or arrange E-visit).    I spent a total of 28  minutes with the patient spent in direct patient consultation.  -> Extra time spent explaining the pathophysiology of atherosclerosis plaque and calcification.  We then discussed options for lipid management going forward.  Reviewed her symptoms from both her stroke and statin intolerance. Additional time spent with chart review  / charting (studies, outside notes, etc): 20 min -> Several studies  reviewed along with clinic notes. Total Time: 48 min  Current medicines are reviewed at length with the patient today.  (+/- concerns) none none  This visit occurred during the SARS-CoV-2 public health emergency.  Safety protocols were in place, including screening questions prior to the visit, additional usage of staff PPE, and extensive cleaning of exam room while observing appropriate contact time as indicated for disinfecting solutions.  Notice: This dictation was prepared with Dragon dictation along with smart phrase technology. Any transcriptional errors that result from this process are unintentional and may not be corrected upon review.   Studies Ordered:  Orders Placed This Encounter  Procedures   CT CARDIAC SCORING (SELF PAY ONLY)   EKG 12-Lead     Patient Instructions / Medication Changes & Studies & Tests Ordered   Patient Instructions  Medication Instructions:  No changes  *If you need a refill on your cardiac medications before your next appointment, please  call your pharmacy*   Lab Work:  Not needed   Testing/Procedures:  CT coronary calcium score. This test is done at [address]. This is $99 out of pocket.  Follow-Up: At [facility], you and your health needs are our priority.  As part of our continuing mission to provide you with exceptional heart care, we have created designated Provider Care Teams.  These Care Teams include your primary Cardiologist (physician) and Advanced Practice Providers (APPs -  Physician Assistants and Nurse Practitioners) who all work together to provide you with the care you need, when you need it.     Your next appointment:   2 month(s)  The format for your next appointment:   In Person  Provider:   None    Other Instructions     Glenetta Hew, M.D., M.S. Interventional Cardiologist   Pager # [phone] Phone # [phone] [address] [place], [place] [address]   Thank you for choosing Heartcare at [facility]!!

## 2021-03-05 ENCOUNTER — Encounter: Payer: Self-pay | Admitting: Cardiology

## 2021-03-05 NOTE — Assessment & Plan Note (Signed)
Last A1c was 5.5

## 2021-03-05 NOTE — Assessment & Plan Note (Signed)
High total cholesterol and LDL but also relatively high HDL.  Could consider extended lipid panel but for now I think we would simply start with baseline cardiovascular risk assessment.  Need to determine if there is indication beyond her stroke to treat.  Plan: Coronary Calcium Score Will need to get recent lipids from PCP)  Pending results coronary calcium score if indicated, will consider Referral to CVRR ([facility]  Cardiovascular [facility]) Lipid Clinic

## 2021-03-05 NOTE — Assessment & Plan Note (Signed)
Coronary calcification noted on CT scan.  Need to quantify.  She is totally asymptomatic.  No known family history because she is adopted.  Would already want to see her LDL hopefully less than 100 and a minimum because of her stroke, preferably less than 70.    Plan: Further risk stratification with Coronary Calcium Score.  Continue aspirin.

## 2021-03-05 NOTE — Assessment & Plan Note (Signed)
ILR in place.  No signs or symptoms of A. fib.  Is on aspirin, but has poorly controlled lipids-not on statin because of memory issues.

## 2021-03-08 DIAGNOSIS — F4310 Post-traumatic stress disorder, unspecified: Secondary | ICD-10-CM | POA: Diagnosis not present

## 2021-03-20 DIAGNOSIS — F332 Major depressive disorder, recurrent severe without psychotic features: Secondary | ICD-10-CM | POA: Diagnosis not present

## 2021-03-20 DIAGNOSIS — F4310 Post-traumatic stress disorder, unspecified: Secondary | ICD-10-CM | POA: Diagnosis not present

## 2021-04-19 ENCOUNTER — Other Ambulatory Visit: Payer: Self-pay

## 2021-05-09 ENCOUNTER — Ambulatory Visit: Payer: BC Managed Care – PPO | Admitting: Cardiology

## 2021-05-11 ENCOUNTER — Ambulatory Visit (INDEPENDENT_AMBULATORY_CARE_PROVIDER_SITE_OTHER)
Admission: RE | Admit: 2021-05-11 | Discharge: 2021-05-11 | Disposition: A | Discharge status: Home / Self Care | Payer: Self-pay | Source: Ambulatory Visit | Attending: Cardiology | Admitting: Cardiology

## 2021-05-11 ENCOUNTER — Other Ambulatory Visit: Payer: Self-pay

## 2021-05-11 DIAGNOSIS — I2510 Atherosclerotic heart disease of native coronary artery without angina pectoris: Secondary | ICD-10-CM

## 2021-05-11 DIAGNOSIS — I639 Cerebral infarction, unspecified: Secondary | ICD-10-CM

## 2021-05-16 DIAGNOSIS — R911 Solitary pulmonary nodule: Secondary | ICD-10-CM | POA: Insufficient documentation

## 2021-05-18 ENCOUNTER — Telehealth: Payer: Self-pay

## 2021-05-18 ENCOUNTER — Encounter: Payer: Self-pay | Admitting: Cardiology

## 2021-05-18 ENCOUNTER — Telehealth: Payer: Self-pay | Admitting: Cardiology

## 2021-05-18 NOTE — Telephone Encounter (Signed)
Spoke with patient about her results. She wants to know next steps. Informed her that will repeat CT in 6 months. Patient stated during appointment provider mentioned starting her on lipid medication, but she knows she has intolerances to many statins tried in the past. She would like to know if there is a Statin recommendation or if will continue to monitor even with high score. She was diagnosed with lung cancer at [facility] and is waiting to hear from the surgeon on when that will be scheduled.  Please advise if want to start STATIN or any other cholesterol medication. No additional questions at this time, patient would like someone to return a call/message with plan, even if there isn't a change.

## 2021-05-18 NOTE — Telephone Encounter (Signed)
See previous note as called patient from her MyChart call.

## 2021-05-18 NOTE — Telephone Encounter (Signed)
Patient calling for CT results. She states she saw them on mychart and they do not look good.

## 2021-05-18 NOTE — Telephone Encounter (Signed)
I result reviewed note was as follows: Coronary Calcium Score is 253.   -> This is moderate level of coronary calcium indicating evidence of coronary artery disease.  Recommend aggressive risk factor modification lowering cholesterol, treating blood pressure and blood sugars.   There is also evidence of aortic calcification-indicating aortic atherosclerosis.   Noncardiac findings suggest a 7 x 6 mm nodule in the left lower lobe that is nonspecific. => Recommend follow-up CT scan in 6 to 12 months to look for persistence.     Bryan Lemma, MD  Quite likely this nodule is where she is meeting about the lung cancer diagnosis.  As for the coronary calcium score, this was a screening test done to determine her risk.  All it means is that we should start paying closer attention to monitoring and treating her cholesterol-not an emergent or urgent situation..  My recommendation would be for her to go forward with her evaluation for lung cancer and have that treatment done.    Once that treatment is over, we can consider treatment options for cholesterol.  The cholesterol needs to be treated regardless because of her stroke, so this was just to add an impetus to treat it.  It was not meant to upset her or exacerbate anything. -- Not A LOUSY STUDY.-- just confirms presence of coronary atherosclerosis.  No rush to do any additional testing or treatment -- possible Lung Ca Dx is PRIORITY!!.    [facility]

## 2021-05-29 NOTE — Telephone Encounter (Addendum)
Left detailed message (okay per DPR) for patient to know that Dr. Ellyn Hack has not recommendations for changes at this time. To focus on cancer diagnosis and plan.

## 2021-06-14 ENCOUNTER — Telehealth: Payer: Self-pay | Admitting: Cardiology

## 2021-06-14 NOTE — Telephone Encounter (Signed)
Spoke with pt, she reports for the last couple weeks she has had a heavy feeling in her chest that she felt was related to her stomach. She started taking Nexium and last night she woke up for another reason and she had an aching in the center of her chest and it felt like it was vibrating. She reports it lasted about 30 min to 1 hour but is not sure. She denies any SOB or other symptoms. She feels like it is similar to the acid reflux she has had in the past but is not sure. She reports she is a little overwhelmed currently because she is a healthy person and recently a lot of things have been going on. Follow up appointment made for next week but patient voiced understanding to go to the ER if the pain returns and gets worse. ?

## 2021-06-14 NOTE — Telephone Encounter (Signed)
? ?  Pt c/o of Chest Pain: STAT if CP now or developed within 24 hours ? ?1. Are you having CP right now? no ? ?2. Are you experiencing any other symptoms (ex. SOB, nausea, vomiting, sweating)? Night sweats, but patient thinks it may be related to Cancer ? ?3. How long have you been experiencing CP? Lasted about 30 min last night ? ?4. Is your CP continuous or coming and going? Comes and goes ? ?5. Have you taken Nitroglycerin? No ? ?Patient was told by two MD's and an APP at [facility] yesterday that she needs to see her Cardiologist ASAP. She spent most of her day yesterday in Dr's offices  ??  ?

## 2021-06-19 ENCOUNTER — Encounter: Payer: Self-pay | Admitting: Physician Assistant

## 2021-06-19 ENCOUNTER — Ambulatory Visit: Payer: BC Managed Care – PPO | Admitting: Physician Assistant

## 2021-06-19 ENCOUNTER — Other Ambulatory Visit: Payer: Self-pay

## 2021-06-19 VITALS — BP 110/68 | HR 70 | Ht 63.0 in | Wt 152.0 lb

## 2021-06-19 DIAGNOSIS — Z8673 Personal history of transient ischemic attack (TIA), and cerebral infarction without residual deficits: Secondary | ICD-10-CM | POA: Diagnosis not present

## 2021-06-19 DIAGNOSIS — E782 Mixed hyperlipidemia: Secondary | ICD-10-CM | POA: Diagnosis not present

## 2021-06-19 DIAGNOSIS — I2510 Atherosclerotic heart disease of native coronary artery without angina pectoris: Secondary | ICD-10-CM

## 2021-06-19 DIAGNOSIS — R079 Chest pain, unspecified: Secondary | ICD-10-CM

## 2021-06-19 NOTE — Patient Instructions (Addendum)
Medication Instructions:  ?Your physician recommends that you continue on your current medications as directed. Please refer to the Current Medication list given to you today. ? ?*If you need a refill on your cardiac medications before your next appointment, please call your pharmacy* ? ?Lab Work: ?NONE ordered at this time of appointment  ? ?If you have labs (blood work) drawn today and your tests are completely normal, you will receive your results only by: ?MyChart Message (if you have MyChart) OR ?A paper copy in the mail ?If you have any lab test that is abnormal or we need to change your treatment, we will call you to review the results. ? ? ?Testing/Procedures: ?Your physician has requested that you have a lexiscan myoview. For further information please visit [email or web address] Please follow instruction sheet, as given. ? ?Please schedule for 1st available  ? ? ?Follow-Up: ?At [facility], you and your health needs are our priority.  As part of our continuing mission to provide you with exceptional heart care, we have created designated Provider Care Teams.  These Care Teams include your primary Cardiologist (physician) and Advanced Practice Providers (APPs -  Physician Assistants and Nurse Practitioners) who all work together to provide you with the care you need, when you need it. ? ?Your next appointment:   ?As previously scheduled   ? ?The format for your next appointment:   ?In Person ? ?Provider:   ?Bryan Lemma, MD   ? ?Other Instructions ?You have been referred to [facility] with Dr. Rennis Golden within 3 months  ? ? ?

## 2021-06-19 NOTE — Progress Notes (Signed)
?Cardiology Office Note:   ? ?Date:  06/20/2021  ? ?ID:  Jenna Delgado, DOB [birth year], MRN [id number] ? ?PCP:  Hayden Rasmussen, MD ?  ?[facility] Providers ?Cardiologist:  Glenetta Hew, MD    ? ?Referring MD: Hayden Rasmussen, MD  ? ?Chief Complaint  ?Patient presents with  ? Follow-up  ? Chest Pain  ? ? ?History of Present Illness:   ? ?Jenna Delgado is a 61 y.o. female with a hx of coronary artery calcification, history of CVA, mild intermittent asthma and hyperlipidemia.  Patient is adopted therefore does not know her family history.  Patient was referred to Dr. Ellyn Hack in December 2022 for evaluation of cardiovascular risk assessment.  Previous CT image revealed coronary artery calcification.  Patient was previously followed by Dr. Lovena Le after having a loop recorder placed for cryptogenic stroke.  No A-fib was ever detected.  Loop recorder was eventually removed.  TTE in June 2017 showed EF 55 to 60%, no regional wall motion abnormality, grade 1 DD, mild LAE.  TEE obtained on 10/30/2015 also showed normal EF, no thrombus or vegetation, no PFO.  She has multiple intolerance in the past and has tried Lipitor, Crestor, Zocor along with pravastatin.  She has mild myalgia on the statins but mostly memory issues.  Given lack of anginal symptom, patient ultimately underwent coronary calcium scoring on 05/11/2021 which showed calcium score of 200 in the right coronary artery, 52.8 in the left anterior descending artery, 0 in the left main, 0 in the left circumflex artery, total calcium score 253 which placed the patient at 95th percentile for age and sex matched control.  7 x 6 mm groundglass attenuation nodule in the left lower lobe, follow-up CT in 6 to 12 months is recommended.  Since then, patient has been established with Dr. Owens Shark of [facility] oncology service to follow-up on the lung nodule. VATS procedure has been discussed with the patient and her husband. ? ?Patient presents today for follow-up.   She had a EKG at [facility] last week that was normal.  I saw it on her phone.  She also had a recent lipid panel that showed total cholesterol 225 and LDL 117, both were elevated.  I will refer her to lipid clinic.  For the past month, she has been having intermittent chest discomfort.  1 days, she was standing in the shower, she had a really strong episode of left-sided chest pain, this lasted 20 minutes and then went away and has not recurred since.  Otherwise she has milder form of chest pressure in the substernal area.  Symptom does not typically occur with physical exertion and may last up to hours at a time.  She is not sure if this is her GI issue versus cardiac issue.  She has history of iodine and contrast allergy, he has had a coronary CT, we recommended a nuclear stress test.  Risk and benefit discussed with the patient who is agreed to proceed. ? ?Past Medical History:  ?Diagnosis Date  ? Asthma   ? Prediabetes   ? Seasonal allergic rhinitis   ? Stroke [facility])   ? Vitamin D deficiency   ? ? ?Past Surgical History:  ?Procedure Laterality Date  ? ABDOMINAL HYSTERECTOMY    ? BLADDER SUSPENSION    ? EP IMPLANTABLE DEVICE N/A 10/03/2015  ? Procedure: Loop Recorder Insertion;  Surgeon: Evans Lance, MD;  Location: [place] [facility];  Service: Cardiovascular;  Laterality: N/A;  ?  TEE WITHOUT CARDIOVERSION N/A 10/03/2015  ? Procedure: TRANSESOPHAGEAL ECHOCARDIOGRAM (TEE);  Surgeon: Vesta Mixer, MD;  Location: [facility] ENDOSCOPY;  Service: Cardiovascular;  Laterality: N/A;  ? ? ?Current Medications: ?Current Meds  ?Medication Sig  ? Acetylcarnitine HCl (ACETYL L-CARNITINE PO) Take 2 capsules by mouth daily.   ? aspirin EC 81 MG tablet Take 81 mg by mouth daily.  ? B Complex Vitamins (B COMPLEX PO) Take 1 tablet by mouth daily.  ? Cholecalciferol 25 MCG (1000 UT) tablet Take 1,000 Units by mouth daily.  ? Coenzyme Q10 (CO Q-10 PO) Take 1 capsule by mouth daily.  ? fluticasone (FLONASE) 50 MCG/ACT nasal spray Place 1-2  sprays into both nostrils daily as needed for allergies or rhinitis.   ? MAGNESIUM PO Take 400 mg by mouth at bedtime.  ? medium chain triglycerides (MCT OIL) oil Take 15 mLs by mouth daily.  ? Melatonin 5 MG TABS Take 5 mg by mouth at bedtime as needed (sleep).  ? MULTIPLE VITAMIN-FOLIC ACID PO 1 tablet  ? Multiple Vitamins-Minerals (ONE-A-DAY WOMENS 50+ ADVANTAGE) TABS Take 1 tablet by mouth daily.  ? Omega-3 Fatty Acids (FISH OIL PO) Take 1 capsule by mouth daily.  ? Pirbuterol Acetate (MAXAIR AUTOHALER IN) Inhale 1-2 puffs into the lungs every 6 (six) hours as needed (for shortness of breath).   ? TURMERIC PO Take 1,000 mg by mouth daily. IN LIQUID FORM  ?  ? ?Allergies:   Benadryl [diphenhydramine hcl], Sumatriptan, Atorvastatin, Iodine, Ivp dye [iodinated contrast media], Metrizamide, Copper-containing compounds, Crestor [rosuvastatin calcium], Resveratrol, Statins, Zocor [simvastatin], Aminophylline, Demeclocycline, Lamictal [lamotrigine], Lobster [shellfish allergy], Metoclopramide, Tetracyclines & related, and Theophyllines  ? ?Social History  ? ?Socioeconomic History  ? Marital status: Married  ?  Spouse name: Not on file  ? Number of children: Not on file  ? Years of education: Not on file  ? Highest education level: Not on file  ?Occupational History  ? Not on file  ?Tobacco Use  ? Smoking status: Former  ? Smokeless tobacco: Never  ?Vaping Use  ? Vaping Use: Never used  ?Substance and Sexual Activity  ? Alcohol use: Yes  ?  Comment: occasionally  ? Drug use: Yes  ?  Types: Marijuana  ?  Comment: occasionally  ? Sexual activity: Not on file  ?Other Topics Concern  ? Not on file  ?Social History Narrative  ? Drinks very little caffeine   ? ?Social Determinants of Health  ? ?Financial Resource Strain: Not on file  ?Food Insecurity: Not on file  ?Transportation Needs: Not on file  ?Physical Activity: Not on file  ?Stress: Not on file  ?Social Connections: Not on file  ?  ? ?Family History: ?The patient's  family history is not on file. She was adopted. ? ?ROS:   ?Please see the history of present illness.    ? All other systems reviewed and are negative. ? ?EKGs/Labs/Other Studies Reviewed:   ? ?The following studies were reviewed today: ? ?TEE 10/03/2015 ?LV EF: 60% -   65%  ? ?-------------------------------------------------------------------  ?Indications:      CVA 436.  ? ?-------------------------------------------------------------------  ?History:   Risk factors:  Dyslipidemia.  ? ?-------------------------------------------------------------------  ?Study Conclusions  ? ?- Left ventricle: Systolic function was normal. The estimated  ?  ejection fraction was in the range of 60% to 65%.  ?- Aortic valve: No evidence of vegetation.  ?- Mitral valve: No evidence of vegetation.  ?- Left atrium: No evidence of thrombus in  the atrial cavity or  ?  appendage.  ?- Atrial septum: No defect or patent foramen ovale was identified. ? ?EKG:  EKG is not ordered today.   ? ?Recent Labs: ?07/16/2020: BUN 24; Creatinine, Ser 0.72; Hemoglobin 11.9; Platelets 271; Potassium 4.1; Sodium 136  ?Recent Lipid Panel ?   ?Component Value Date/Time  ? CHOL 150 09/30/2015 0820  ? TRIG 111 09/30/2015 0820  ? HDL 51 09/30/2015 0820  ? CHOLHDL 2.9 09/30/2015 0820  ? VLDL 22 09/30/2015 0820  ? [place] 77 09/30/2015 0820  ? ? ? ?Risk Assessment/Calculations:   ?  ? ?    ? ?Physical Exam:   ? ?VS:  BP 110/68 (BP Location: Left Arm, Patient Position: Sitting, Cuff Size: Normal)   Pulse 70   Ht 5\' 3"  (1.6 m)   Wt 152 lb (68.9 kg)   BMI 26.93 kg/m?    ? ?Wt Readings from Last 3 Encounters:  ?06/19/21 152 lb (68.9 kg)  ?03/02/21 148 lb 3.2 oz (67.2 kg)  ?07/16/20 135 lb (61.2 kg)  ?  ? ?GEN:  Well nourished, well developed in no acute distress ?HEENT: Normal ?NECK: No JVD; No carotid bruits ?LYMPHATICS: No lymphadenopathy ?CARDIAC: RRR, no murmurs, rubs, gallops ?RESPIRATORY:  Clear to auscultation without rales, wheezing or rhonchi  ?ABDOMEN:  Soft, non-tender, non-distended ?MUSCULOSKELETAL:  No edema; No deformity  ?SKIN: Warm and dry ?NEUROLOGIC:  Alert and oriented x 3 ?PSYCHIATRIC:  Normal affect  ? ?ASSESSMENT:   ? ?1. Chest pain, unspecified type

## 2021-06-20 ENCOUNTER — Telehealth (HOSPITAL_COMMUNITY): Payer: Self-pay | Admitting: *Deleted

## 2021-06-20 NOTE — Telephone Encounter (Signed)
Close encounter 

## 2021-06-21 ENCOUNTER — Ambulatory Visit (HOSPITAL_COMMUNITY)
Admission: RE | Admit: 2021-06-21 | Discharge: 2021-06-21 | Disposition: A | Discharge status: Home / Self Care | Payer: BC Managed Care – PPO | Source: Ambulatory Visit | Attending: Cardiovascular Disease | Admitting: Cardiovascular Disease

## 2021-06-21 ENCOUNTER — Other Ambulatory Visit: Payer: Self-pay

## 2021-06-21 DIAGNOSIS — R079 Chest pain, unspecified: Secondary | ICD-10-CM | POA: Diagnosis present

## 2021-06-21 MED ORDER — TECHNETIUM TC 99M TETROFOSMIN IV KIT
9.7000 | PACK | Freq: Once | INTRAVENOUS | Status: AC | PRN
  Administered 2021-06-21: 9.7 via INTRAVENOUS
  Filled 2021-06-21: qty 10

## 2021-06-21 MED ORDER — REGADENOSON 0.4 MG/5ML IV SOLN
0.4000 mg | Freq: Once | INTRAVENOUS | Status: AC
  Administered 2021-06-21: 0.4 mg via INTRAVENOUS

## 2021-06-21 MED ORDER — TECHNETIUM TC 99M TETROFOSMIN IV KIT
32.1000 | PACK | Freq: Once | INTRAVENOUS | Status: AC | PRN
  Administered 2021-06-21: 32.1 via INTRAVENOUS
  Filled 2021-06-21: qty 33

## 2021-06-22 LAB — MYOCARDIAL PERFUSION IMAGING
LV dias vol: 84 mL (ref 46–106)
LV sys vol: 43 mL
Nuc Stress EF: 49 %
Rest Nuclear Isotope Dose: 9.7 mCi
SDS: 4
SRS: 1
SSS: 5
ST Depression (mm): 0 mm
Stress Nuclear Isotope Dose: 32.1 mCi
TID: 1.07

## 2021-06-23 ENCOUNTER — Other Ambulatory Visit: Payer: Self-pay | Admitting: Physician Assistant

## 2021-06-23 ENCOUNTER — Telehealth: Payer: Self-pay | Admitting: Physician Assistant

## 2021-06-23 MED ORDER — NITROGLYCERIN 0.4 MG SL SUBL: 0.4000 mg | SUBLINGUAL_TABLET | SUBLINGUAL | 3 refills | Status: AC | PRN

## 2021-06-23 NOTE — Progress Notes (Signed)
Hi. Dr. Ellyn Hack, can you take a look at this stress test report. Patient was having some intermittent chest pain recently. The characteristics of chest pain was atypical as it does not worsen with physical exertion. She has upcoming VATS procedure at [facility] on 4/20. Given the small size of reversible defect, would it be more reasonable to proceed with VATS procedure then consider cath afterward?

## 2021-06-23 NOTE — Progress Notes (Signed)
I reviewed the study.  Difficult to really tell what it is saying.  There is an area that clearly looks like artifact and it probably is the whole area.  However cannot exclude small focal area of ischemia.  Would be appropriate low risk study I think she is probably okay to go forward with her lung surgery.  We can then consider whether or not we need to do additional evaluation after that is complete. ? ?Bryan Lemma, MD ? ?

## 2021-06-23 NOTE — Telephone Encounter (Signed)
I have called and discussed the case with the patient during lunchtime today.  Myoview does show a small area of possible ischemia.  Although her chest discomfort is somewhat atypical.  She only has mild intermittent chest discomfort at this point.  Symptom occurs with emotional stress, however can also occur at rest and with exertion.  Exertion does not seems to worsen her symptoms though.  Doing cardiac catheterization involve possible stent placement which likely will delay her VATS procedure.  I reviewed Myoview with Dr. Herbie Baltimore, we felt given the small area of ischemia, Myoview is still consider fairly low risk.  She should proceed with her VATS procedure, I have prescribed some sublingual nitroglycerin.  Her borderline blood pressure make her poor candidate for Imdur or beta-blocker.  She is intolerant of statins.  After the VATS procedure, she has a follow-up with Dr. Herbie Baltimore in early May we will review possible cardiac catheterization at that time.  I discussed this with the patient who is agreeable with this plan. ?

## 2021-07-27 DIAGNOSIS — F419 Anxiety disorder, unspecified: Secondary | ICD-10-CM | POA: Insufficient documentation

## 2021-07-27 DIAGNOSIS — J4520 Mild intermittent asthma, uncomplicated: Secondary | ICD-10-CM | POA: Insufficient documentation

## 2021-07-27 DIAGNOSIS — Z8669 Personal history of other diseases of the nervous system and sense organs: Secondary | ICD-10-CM | POA: Insufficient documentation

## 2021-07-27 DIAGNOSIS — K225 Diverticulum of esophagus, acquired: Secondary | ICD-10-CM | POA: Insufficient documentation

## 2021-07-27 DIAGNOSIS — F1210 Cannabis abuse, uncomplicated: Secondary | ICD-10-CM | POA: Insufficient documentation

## 2021-08-01 DIAGNOSIS — Z9889 Other specified postprocedural states: Secondary | ICD-10-CM | POA: Insufficient documentation

## 2021-08-01 DIAGNOSIS — Z85118 Personal history of other malignant neoplasm of bronchus and lung: Secondary | ICD-10-CM | POA: Insufficient documentation

## 2021-08-07 ENCOUNTER — Ambulatory Visit: Payer: BC Managed Care – PPO | Admitting: Cardiology

## 2021-11-14 ENCOUNTER — Telehealth: Payer: BC Managed Care – PPO | Admitting: Internal Medicine

## 2022-01-11 ENCOUNTER — Other Ambulatory Visit: Payer: Self-pay | Admitting: Family Medicine

## 2022-01-11 ENCOUNTER — Ambulatory Visit
Admission: RE | Admit: 2022-01-11 | Discharge: 2022-01-11 | Disposition: A | Discharge status: Home / Self Care | Payer: BC Managed Care – PPO | Source: Ambulatory Visit | Attending: Family Medicine | Admitting: Family Medicine

## 2022-01-11 DIAGNOSIS — W19XXXA Unspecified fall, initial encounter: Secondary | ICD-10-CM

## 2022-01-11 DIAGNOSIS — T1490XA Injury, unspecified, initial encounter: Secondary | ICD-10-CM

## 2022-03-07 ENCOUNTER — Other Ambulatory Visit: Payer: Self-pay | Admitting: Family Medicine

## 2022-03-07 ENCOUNTER — Ambulatory Visit
Admission: RE | Admit: 2022-03-07 | Discharge: 2022-03-07 | Disposition: A | Discharge status: Home / Self Care | Payer: BC Managed Care – PPO | Source: Ambulatory Visit | Attending: Family Medicine | Admitting: Family Medicine

## 2022-03-07 DIAGNOSIS — S6992XA Unspecified injury of left wrist, hand and finger(s), initial encounter: Secondary | ICD-10-CM

## 2022-12-26 ENCOUNTER — Other Ambulatory Visit (HOSPITAL_BASED_OUTPATIENT_CLINIC_OR_DEPARTMENT_OTHER): Payer: Self-pay

## 2022-12-26 MED ORDER — TIRZEPATIDE-WEIGHT MANAGEMENT 5 MG/0.5ML ~~LOC~~ SOAJ
5.0000 mg | SUBCUTANEOUS | 0 refills | Status: AC
  Filled 2022-12-26: qty 2, 28d supply, fill #0

## 2023-01-14 ENCOUNTER — Ambulatory Visit: Payer: BC Managed Care – PPO | Admitting: Podiatry

## 2023-01-14 DIAGNOSIS — I639 Cerebral infarction, unspecified: Secondary | ICD-10-CM | POA: Insufficient documentation
